# Patient Record
Sex: Male | Born: 1970 | Race: Black or African American | Hispanic: No | Marital: Married | Smoking: Never smoker
Health system: Southern US, Community
[De-identification: ages and names within clinical notes are randomized; demographics above are authoritative.]

## PROBLEM LIST (undated history)

## (undated) DIAGNOSIS — I10 Essential (primary) hypertension: Secondary | ICD-10-CM

## (undated) DIAGNOSIS — I776 Arteritis, unspecified: Secondary | ICD-10-CM

## (undated) DIAGNOSIS — G459 Transient cerebral ischemic attack, unspecified: Secondary | ICD-10-CM

## (undated) DIAGNOSIS — E785 Hyperlipidemia, unspecified: Secondary | ICD-10-CM

## (undated) DIAGNOSIS — I639 Cerebral infarction, unspecified: Secondary | ICD-10-CM

## (undated) DIAGNOSIS — K219 Gastro-esophageal reflux disease without esophagitis: Secondary | ICD-10-CM

## (undated) DIAGNOSIS — I219 Acute myocardial infarction, unspecified: Secondary | ICD-10-CM

## (undated) DIAGNOSIS — N200 Calculus of kidney: Secondary | ICD-10-CM

## (undated) DIAGNOSIS — M549 Dorsalgia, unspecified: Secondary | ICD-10-CM

## (undated) DIAGNOSIS — M5417 Radiculopathy, lumbosacral region: Secondary | ICD-10-CM

## (undated) DIAGNOSIS — G43909 Migraine, unspecified, not intractable, without status migrainosus: Secondary | ICD-10-CM

## (undated) HISTORY — DX: Gastro-esophageal reflux disease without esophagitis: K21.9

## (undated) HISTORY — DX: Radiculopathy, lumbosacral region: M54.17

## (undated) HISTORY — DX: Acute myocardial infarction, unspecified: I21.9

---

## 2012-08-08 HISTORY — PX: BACK SURGERY: SHX140

## 2012-08-08 HISTORY — PX: SPINE SURGERY: SHX786

## 2016-05-18 ENCOUNTER — Emergency Department (HOSPITAL_COMMUNITY)
Admission: EM | Admit: 2016-05-18 | Discharge: 2016-05-18 | Disposition: A | Discharge status: Home / Self Care | Payer: Medicare Other | Attending: Emergency Medicine | Admitting: Emergency Medicine

## 2016-05-18 ENCOUNTER — Encounter (HOSPITAL_COMMUNITY): Payer: Self-pay | Admitting: Emergency Medicine

## 2016-05-18 DIAGNOSIS — R42 Dizziness and giddiness: Secondary | ICD-10-CM

## 2016-05-18 DIAGNOSIS — R001 Bradycardia, unspecified: Secondary | ICD-10-CM | POA: Diagnosis not present

## 2016-05-18 DIAGNOSIS — Z79899 Other long term (current) drug therapy: Secondary | ICD-10-CM | POA: Insufficient documentation

## 2016-05-18 DIAGNOSIS — I10 Essential (primary) hypertension: Secondary | ICD-10-CM | POA: Insufficient documentation

## 2016-05-18 DIAGNOSIS — H9190 Unspecified hearing loss, unspecified ear: Secondary | ICD-10-CM | POA: Diagnosis not present

## 2016-05-18 HISTORY — DX: Dorsalgia, unspecified: M54.9

## 2016-05-18 HISTORY — DX: Essential (primary) hypertension: I10

## 2016-05-18 LAB — URINALYSIS, ROUTINE W REFLEX MICROSCOPIC
BACTERIA UA: NONE SEEN
Bilirubin Urine: NEGATIVE
GLUCOSE, UA: NEGATIVE mg/dL
Ketones, ur: NEGATIVE mg/dL
Leukocytes, UA: NEGATIVE
Nitrite: NEGATIVE
PH: 6 (ref 5.0–8.0)
Protein, ur: NEGATIVE mg/dL
SPECIFIC GRAVITY, URINE: 1.009 (ref 1.005–1.030)

## 2016-05-18 LAB — CBC WITH DIFFERENTIAL/PLATELET
BASOS PCT: 0 %
Basophils Absolute: 0 10*3/uL (ref 0.0–0.1)
Eosinophils Absolute: 0.2 10*3/uL (ref 0.0–0.7)
Eosinophils Relative: 3 %
HEMATOCRIT: 39.2 % (ref 39.0–52.0)
HEMOGLOBIN: 13.3 g/dL (ref 13.0–17.0)
LYMPHS ABS: 2.1 10*3/uL (ref 0.7–4.0)
LYMPHS PCT: 34 %
MCH: 29.5 pg (ref 26.0–34.0)
MCHC: 33.9 g/dL (ref 30.0–36.0)
MCV: 86.9 fL (ref 78.0–100.0)
MONOS PCT: 9 %
Monocytes Absolute: 0.5 10*3/uL (ref 0.1–1.0)
NEUTROS ABS: 3.3 10*3/uL (ref 1.7–7.7)
NEUTROS PCT: 54 %
Platelets: 294 10*3/uL (ref 150–400)
RBC: 4.51 MIL/uL (ref 4.22–5.81)
RDW: 12.6 % (ref 11.5–15.5)
WBC: 6.2 10*3/uL (ref 4.0–10.5)

## 2016-05-18 LAB — BASIC METABOLIC PANEL
Anion gap: 7 (ref 5–15)
BUN: 19 mg/dL (ref 6–20)
CHLORIDE: 106 mmol/L (ref 101–111)
CO2: 26 mmol/L (ref 22–32)
CREATININE: 1.22 mg/dL (ref 0.61–1.24)
Calcium: 9.2 mg/dL (ref 8.9–10.3)
GFR calc non Af Amer: >60 mL/min (ref >60)
GLUCOSE: 113 mg/dL — AB (ref 65–99)
Potassium: 3.4 mmol/L — ABNORMAL LOW (ref 3.5–5.1)
Sodium: 139 mmol/L (ref 135–145)

## 2016-05-18 MED ORDER — FLUTICASONE PROPIONATE 50 MCG/ACT NA SUSP: 2.0000 | Freq: Every day | NASAL | 0 refills | Status: DC

## 2016-05-18 NOTE — ED Notes (Signed)
Patient given some graham crackers and water at his request

## 2016-05-18 NOTE — ED Provider Notes (Signed)
[facility] DEPT Provider Note   CSN: [id number] Arrival date & time: 05/18/16  1014     History   Chief Complaint Chief Complaint  Patient presents with  . Dizziness    HPI Shannon Wilson is a 46 y.o. male.  The history is provided by the patient.  Dizziness  Quality:  Lightheadedness (Pt feels like he going to pass out.  denies vertiginous sx.) Severity:  Moderate Onset quality:  Sudden (woke with yesterday morning) Timing:  Intermittent Progression:  Unchanged Chronicity:  New Context: standing up   Context: not with head movement, not with inactivity and not with loss of consciousness   Relieved by:  Being still Worsened by:  Standing up and sitting upright Ineffective treatments:  None tried Associated symptoms: hearing loss   Associated symptoms: no chest pain, no headaches, no nausea, no palpitations, no shortness of breath, no syncope, no tinnitus, no vision changes, no vomiting and no weakness   Associated symptoms comment:  He reports his ears feel "stopped up".  Denies earache.  He has had no recent uri/sinus, congestion sx.   Risk factors: no new medications   Risk factors comment:  Pt reports increased stress with move from [place] 3 days ago.  Appetite good, probably drinking less fluids over the past few days. States was admitted to the ICU in [place] one month ago for an inflammed main aorta which was treated with abx.   Past Medical History:  Diagnosis Date  . Back pain   . Hypertension     There are no active problems to display for this patient.   Past Surgical History:  Procedure Laterality Date  . BACK SURGERY         Home Medications    Prior to Admission medications   Medication Sig Start Date End Date Taking? Authorizing Provider  amLODipine (NORVASC) 10 MG tablet Take 10 mg by mouth daily.   Yes Historical Provider, MD  gabapentin (NEURONTIN) 300 MG capsule Take 300 mg by mouth 3 (three) times daily.   Yes Historical  Provider, MD  hydrochlorothiazide (HYDRODIURIL) 12.5 MG tablet Take 1 tablet by mouth daily.   Yes Historical Provider, MD  lisinopril (PRINIVIL,ZESTRIL) 40 MG tablet Take 1 tablet by mouth daily.   Yes Historical Provider, MD  fluticasone (FLONASE) 50 MCG/ACT nasal spray Place 2 sprays into both nostrils daily. 05/18/16   Burgess AmorJulie Kevyn Wengert, PA-C    Family History History reviewed. No pertinent family history.  Social History Social History  Substance Use Topics  . Smoking status: Never Smoker  . Smokeless tobacco: Never Used  . Alcohol use No     Allergies   Patient has no known allergies.   Review of Systems Review of Systems  HENT: Positive for hearing loss. Negative for tinnitus.   Respiratory: Negative for shortness of breath.   Cardiovascular: Negative for chest pain, palpitations and syncope.  Gastrointestinal: Negative for nausea and vomiting.  Neurological: Positive for dizziness. Negative for weakness and headaches.     Physical Exam Updated Vital Signs BP 137/95   Pulse 66   Temp 97.8 F (36.6 C) (Oral)   Resp 18   Ht 5\' 11"  (1.803 m)   Wt 86.2 kg   SpO2 99%   BMI 26.50 kg/m   Physical Exam  Constitutional: He appears well-developed and well-nourished.  HENT:  Head: Normocephalic and atraumatic.  Right Ear: Tympanic membrane is bulging. Tympanic membrane is not injected and not erythematous. No middle ear effusion.  Left Ear:  Tympanic membrane is bulging. Tympanic membrane is not injected and not erythematous.  No middle ear effusion.  Nose: No mucosal edema.  Bilateral loss of TM landmarks.  Eyes: Conjunctivae and EOM are normal. Pupils are equal, round, and reactive to light. Right eye exhibits no nystagmus. Left eye exhibits no nystagmus.  Neck: Normal range of motion.  Cardiovascular: Regular rhythm, normal heart sounds and intact distal pulses.  Bradycardia present.   Pulmonary/Chest: Effort normal and breath sounds normal. He has no wheezes.    Abdominal: Soft. Bowel sounds are normal. There is no tenderness.  Musculoskeletal: Normal range of motion.  Neurological: He is alert.  Skin: Skin is warm and dry.  Psychiatric: He has a normal mood and affect.  Nursing note and vitals reviewed.    ED Treatments / Results  Labs (all labs ordered are listed, but only abnormal results are displayed) Labs Reviewed  URINALYSIS, ROUTINE W REFLEX MICROSCOPIC - Abnormal; Notable for the following:       Result Value   Hgb urine dipstick SMALL (*)    All other components within normal limits  BASIC METABOLIC PANEL - Abnormal; Notable for the following:    Potassium 3.4 (*)    Glucose, Bld 113 (*)    All other components within normal limits  CBC WITH DIFFERENTIAL/PLATELET    EKG  EKG Interpretation  Date/Time:  Thursday May 18 2016 10:32:58 EST Ventricular Rate:  53 PR Interval:    QRS Duration: 91 QT Interval:  426 QTC Calculation: 400 R Axis:   33 Text Interpretation:  Sinus rhythm Abnormal R-wave progression, early transition Confirmed by Rubin Payor  MD, NATHAN [phone]) on 05/18/2016 1:02:56 PM       Radiology No results found.  Procedures Procedures (including critical care time)  Medications Ordered in ED Medications - No data to display   Initial Impression / Assessment and Plan / ED Course  I have reviewed the triage vital signs and the nursing notes.  Pertinent labs & imaging results that were available during my care of the patient were reviewed by me and considered in my medical decision making (see chart for details).     Labs reviewed and discussed with pt and stable.  Awaiting outside records.    Records obtained from outside hospital.  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  This past hx appears unrelated to todays sx.   Given ear exam findings suggestive of eustachian tube dysfunction,  Pt placed on flonase nasal steroids.  Referrals given to establish local pcp.  He is ambulatory in dept without antalgic gait or difficulty walking.    The patient appears reasonably screened and/or stabilized for discharge and I doubt any other medical condition or other [facility] requiring further screening, evaluation, or treatment in the ED at this time prior to discharge.   Final Clinical Impressions(s) / ED Diagnoses   Final diagnoses:  Dizziness    New Prescriptions Discharge Medication List as of 05/18/2016  2:13 PM    START taking these medications   Details  fluticasone (FLONASE) 50 MCG/ACT nasal spray Place 2 sprays into both nostrils daily., Starting Thu 05/18/2016, Print         Burgess Amor, PA-C 05/18/16 1435    Benjiman Core, MD 05/18/16 [phone]

## 2016-05-18 NOTE — ED Triage Notes (Signed)
PT states yesterday morning when he woke up he became dizzy when he got out of bed and had dizziness episodes throughout the day with change in positions. PT denies any weakness or numbness to extremities. PT alert and oriented upon arrival to ED today and drove himself today.

## 2016-05-18 NOTE — ED Notes (Signed)
Advised patient we needed urine specimen. 

## 2016-06-13 DIAGNOSIS — M545 Low back pain: Secondary | ICD-10-CM | POA: Diagnosis not present

## 2016-06-19 DIAGNOSIS — M545 Low back pain: Secondary | ICD-10-CM | POA: Diagnosis not present

## 2016-06-22 DIAGNOSIS — M545 Low back pain: Secondary | ICD-10-CM | POA: Diagnosis not present

## 2016-06-26 DIAGNOSIS — M545 Low back pain: Secondary | ICD-10-CM | POA: Diagnosis not present

## 2016-07-29 ENCOUNTER — Encounter (HOSPITAL_COMMUNITY): Payer: Self-pay

## 2016-07-29 ENCOUNTER — Emergency Department (HOSPITAL_COMMUNITY)
Admission: EM | Admit: 2016-07-29 | Discharge: 2016-07-29 | Disposition: A | Discharge status: Home / Self Care | Payer: Medicare Other | Attending: Emergency Medicine | Admitting: Emergency Medicine

## 2016-07-29 DIAGNOSIS — M7989 Other specified soft tissue disorders: Secondary | ICD-10-CM | POA: Diagnosis not present

## 2016-07-29 DIAGNOSIS — H539 Unspecified visual disturbance: Secondary | ICD-10-CM | POA: Diagnosis not present

## 2016-07-29 DIAGNOSIS — M549 Dorsalgia, unspecified: Secondary | ICD-10-CM | POA: Diagnosis not present

## 2016-07-29 DIAGNOSIS — Z79899 Other long term (current) drug therapy: Secondary | ICD-10-CM | POA: Diagnosis not present

## 2016-07-29 DIAGNOSIS — R51 Headache: Secondary | ICD-10-CM | POA: Diagnosis not present

## 2016-07-29 DIAGNOSIS — R531 Weakness: Secondary | ICD-10-CM | POA: Diagnosis not present

## 2016-07-29 DIAGNOSIS — I10 Essential (primary) hypertension: Secondary | ICD-10-CM | POA: Insufficient documentation

## 2016-07-29 LAB — I-STAT CHEM 8, ED
BUN: 17 mg/dL (ref 6–20)
CHLORIDE: 109 mmol/L (ref 101–111)
Calcium, Ion: 1.05 mmol/L — ABNORMAL LOW (ref 1.15–1.40)
Creatinine, Ser: 1.4 mg/dL — ABNORMAL HIGH (ref 0.61–1.24)
Glucose, Bld: 120 mg/dL — ABNORMAL HIGH (ref 65–99)
HCT: 35 % — ABNORMAL LOW (ref 39.0–52.0)
Hemoglobin: 11.9 g/dL — ABNORMAL LOW (ref 13.0–17.0)
POTASSIUM: 3.5 mmol/L (ref 3.5–5.1)
Sodium: 144 mmol/L (ref 135–145)
TCO2: 26 mmol/L (ref 0–100)

## 2016-07-29 MED ORDER — LISINOPRIL 40 MG PO TABS: 40.0000 mg | ORAL_TABLET | Freq: Every day | ORAL | 1 refills | Status: DC

## 2016-07-29 MED ORDER — HYDROCHLOROTHIAZIDE 12.5 MG PO CAPS
12.5000 mg | ORAL_CAPSULE | Freq: Every day | ORAL | Status: DC
  Administered 2016-07-29: 12.5 mg via ORAL
  Filled 2016-07-29 (×3): qty 1

## 2016-07-29 MED ORDER — HYDROCHLOROTHIAZIDE 12.5 MG PO TABS
12.5000 mg | ORAL_TABLET | Freq: Every day | ORAL | 1 refills | Status: DC
Start: 2016-07-29 — End: 2016-08-18

## 2016-07-29 MED ORDER — AMLODIPINE BESYLATE 10 MG PO TABS: 10.0000 mg | ORAL_TABLET | Freq: Every day | ORAL | 1 refills | Status: DC

## 2016-07-29 NOTE — ED Notes (Addendum)
Pt states 4 days without BP Rx, unable to see new PCP until May. States HA, blurred vision, and generalized weakness since being without Rx. Feels similar to before he was prescribed BP Rx.

## 2016-07-29 NOTE — ED Notes (Signed)
Pt states understanding of care given and follow up instructions.  Pt a/o and ambulated with steady gait from ED

## 2016-07-29 NOTE — ED Triage Notes (Signed)
Patient states that he has been out of his blood pressure medications for 4 days.  Needs prescriptions for them until he can see new MD in May 11th.  Recently moved here from [place].

## 2016-07-29 NOTE — ED Notes (Signed)
AC to bring medication 

## 2016-07-29 NOTE — ED Provider Notes (Signed)
[facility] DEPT Provider Note   CSN: [id number] Arrival date & time: 07/29/16  2051  By signing my name below, I, Jaquelyn Bitter., attest that this documentation has been prepared under the direction and in the presence of Fredia Sorrow, MD. Electronically signed: Jaquelyn Bitter., ED Scribe. 07/29/16. 11:01 PM.   History   Chief Complaint Chief Complaint  Patient presents with  . Hypertension    HPI Shannon Wilson is a 46 y.o. male with hx of HTN who presents to the Emergency Department complaining of hypertension with onset x4 days. Pt states that he has been out of his blood pressure for the past x4 days and is here from [place]. Pt states that he normally takes all of his blood pressure medication in the mornings. He reports a waxing and waning headache and weakness for the past x3 days. He rates the headache currently 7/10. He denies any modifying factors. Pt denies fever, cold symptoms, chest pain, nausea and vomiting. Of note, pt reportedly has headaches and weakness with HTN.  The history is provided by the patient. No language interpreter was used.    Past Medical History:  Diagnosis Date  . Back pain   . Hypertension     There are no active problems to display for this patient.   Past Surgical History:  Procedure Laterality Date  . BACK SURGERY         Home Medications    Prior to Admission medications   Medication Sig Start Date End Date Taking? Authorizing Provider  amLODipine (NORVASC) 10 MG tablet Take 10 mg by mouth daily.   Yes Historical Provider, MD  gabapentin (NEURONTIN) 300 MG capsule Take 300 mg by mouth 3 (three) times daily.   Yes Historical Provider, MD  hydrochlorothiazide (HYDRODIURIL) 12.5 MG tablet Take 1 tablet by mouth daily.   Yes Historical Provider, MD  lisinopril (PRINIVIL,ZESTRIL) 40 MG tablet Take 1 tablet by mouth daily.   Yes Historical Provider, MD  amLODipine (NORVASC) 10 MG tablet Take 1 tablet (10 mg  total) by mouth daily. 07/29/16   Fredia Sorrow, MD  fluticasone (FLONASE) 50 MCG/ACT nasal spray Place 2 sprays into both nostrils daily. Patient not taking: Reported on 07/29/2016 05/18/16   Evalee Jefferson, PA-C  hydrochlorothiazide (HYDRODIURIL) 12.5 MG tablet Take 1 tablet (12.5 mg total) by mouth daily. 07/29/16   Fredia Sorrow, MD  lisinopril (PRINIVIL,ZESTRIL) 40 MG tablet Take 1 tablet (40 mg total) by mouth daily. 07/29/16   Fredia Sorrow, MD    Family History No family history on file.  Social History Social History  Substance Use Topics  . Smoking status: Never Smoker  . Smokeless tobacco: Never Used  . Alcohol use No     Allergies   Patient has no known allergies.   Review of Systems Review of Systems  Constitutional: Negative for chills and fever.  HENT: Negative for rhinorrhea and sore throat.   Eyes: Positive for visual disturbance.  Respiratory: Negative for cough and shortness of breath.   Cardiovascular: Positive for leg swelling. Negative for chest pain.  Gastrointestinal: Negative for nausea and vomiting.  Genitourinary: Negative for dysuria and hematuria.  Musculoskeletal: Positive for back pain.  Skin: Negative for rash.  Neurological: Positive for weakness and headaches.  Hematological: Does not bruise/bleed easily.     Physical Exam Updated Vital Signs BP (!) 157/100   Pulse 64   Temp 98.4 F (36.9 C) (Oral)   Resp 16   Ht 5' 11"  (1.803  m)   Wt 88 kg   SpO2 98%   BMI 27.06 kg/m   Physical Exam  Constitutional: He appears well-developed and well-nourished.  HENT:  Head: Normocephalic and atraumatic.  Eyes: Conjunctivae and EOM are normal. Pupils are equal, round, and reactive to light.  Pupils normal, sclera clear, eyes are tracking normally  Neck: Neck supple.  Cardiovascular: Normal rate and regular rhythm.   No murmur heard. Pulmonary/Chest: Effort normal and breath sounds normal. No respiratory distress.  Lungs clear bilaterally.     Abdominal: Soft. Bowel sounds are normal. There is no tenderness.  Musculoskeletal: He exhibits no edema.       Right lower leg: He exhibits no edema.       Left lower leg: He exhibits no edema.  Neurological: He is alert. No cranial nerve deficit or sensory deficit. He exhibits normal muscle tone. Coordination normal.  Skin: Skin is warm and dry.  Psychiatric: He has a normal mood and affect.  Nursing note and vitals reviewed.    ED Treatments / Results   DIAGNOSTIC STUDIES: Oxygen Saturation is 96% on RA, adequate by my interpretation.   COORDINATION OF CARE: 11:01 PM-Discussed next steps with pt. Pt verbalized understanding and is agreeable with the plan.    Labs (all labs ordered are listed, but only abnormal results are displayed) Labs Reviewed  I-STAT CHEM 8, ED - Abnormal; Notable for the following:       Result Value   Creatinine, Ser 1.40 (*)    Glucose, Bld 120 (*)    Calcium, Ion 1.05 (*)    Hemoglobin 11.9 (*)    HCT 35.0 (*)    All other components within normal limits    EKG  EKG Interpretation None       Radiology No results found.  Procedures Procedures (including critical care time)  Medications Ordered in ED Medications  hydrochlorothiazide (MICROZIDE) capsule 12.5 mg (not administered)     Initial Impression / Assessment and Plan / ED Course  I have reviewed the triage vital signs and the nursing notes.  Pertinent labs & imaging results that were available during my care of the patient were reviewed by me and considered in my medical decision making (see chart for details).   patient with known history of hypertension. Tabs hypertensive meds for 4 days. Patient with the intermittent headache mild currently but sometimes worse. No strokelike symptoms no trouble breathing no chest pain. Patient states with blood pressures up he usually does get a mild headache. Nothing unusual about that.  Basic electrolytes here without any sniffing  abnormality. We'll renew his medications. It one dose of medication here tonight. Patient recently moved here from [place]. He will need to find a primary care doctor.    Final Clinical Impressions(s) / ED Diagnoses   Final diagnoses:  Essential hypertension    New Prescriptions New Prescriptions   AMLODIPINE (NORVASC) 10 MG TABLET    Take 1 tablet (10 mg total) by mouth daily.   HYDROCHLOROTHIAZIDE (HYDRODIURIL) 12.5 MG TABLET    Take 1 tablet (12.5 mg total) by mouth daily.   LISINOPRIL (PRINIVIL,ZESTRIL) 40 MG TABLET    Take 1 tablet (40 mg total) by mouth daily.   I personally performed the services described in this documentation, which was scribed in my presence. The recorded information has been reviewed and is accurate.       Fredia Sorrow, MD 07/29/16 [phone]

## 2016-07-29 NOTE — Discharge Instructions (Signed)
Important that you find a primary care doctor. Take your hypertensive meds as directed starting tomorrow. Return for any new or worse symptoms to include severe headache chest pain trouble breathing or any strokelike symptoms.

## 2016-08-18 ENCOUNTER — Encounter: Payer: Self-pay | Admitting: Family Medicine

## 2016-08-18 ENCOUNTER — Ambulatory Visit (INDEPENDENT_AMBULATORY_CARE_PROVIDER_SITE_OTHER): Payer: Medicare Other | Admitting: Family Medicine

## 2016-08-18 VITALS — BP 130/86 | HR 68 | Temp 98.2°F | Resp 16 | Ht 71.0 in | Wt 187.1 lb

## 2016-08-18 DIAGNOSIS — M545 Low back pain, unspecified: Secondary | ICD-10-CM | POA: Insufficient documentation

## 2016-08-18 DIAGNOSIS — I252 Old myocardial infarction: Secondary | ICD-10-CM

## 2016-08-18 DIAGNOSIS — E559 Vitamin D deficiency, unspecified: Secondary | ICD-10-CM | POA: Diagnosis not present

## 2016-08-18 DIAGNOSIS — E739 Lactose intolerance, unspecified: Secondary | ICD-10-CM | POA: Diagnosis not present

## 2016-08-18 DIAGNOSIS — D649 Anemia, unspecified: Secondary | ICD-10-CM | POA: Diagnosis not present

## 2016-08-18 DIAGNOSIS — G8929 Other chronic pain: Secondary | ICD-10-CM | POA: Diagnosis not present

## 2016-08-18 DIAGNOSIS — I776 Arteritis, unspecified: Secondary | ICD-10-CM | POA: Insufficient documentation

## 2016-08-18 DIAGNOSIS — R7309 Other abnormal glucose: Secondary | ICD-10-CM | POA: Diagnosis not present

## 2016-08-18 DIAGNOSIS — I10 Essential (primary) hypertension: Secondary | ICD-10-CM | POA: Diagnosis not present

## 2016-08-18 DIAGNOSIS — I2510 Atherosclerotic heart disease of native coronary artery without angina pectoris: Secondary | ICD-10-CM | POA: Insufficient documentation

## 2016-08-18 LAB — CBC WITH DIFFERENTIAL/PLATELET
BASOS PCT: 1 %
Basophils Absolute: 68 cells/uL (ref 0–200)
EOS ABS: 272 {cells}/uL (ref 15–500)
Eosinophils Relative: 4 %
HEMATOCRIT: 42 % (ref 38.5–50.0)
HEMOGLOBIN: 14 g/dL (ref 13.2–17.1)
LYMPHS ABS: 2108 {cells}/uL (ref 850–3900)
LYMPHS PCT: 31 %
MCH: 29.5 pg (ref 27.0–33.0)
MCHC: 33.3 g/dL (ref 32.0–36.0)
MCV: 88.6 fL (ref 80.0–100.0)
MONO ABS: 544 {cells}/uL (ref 200–950)
MPV: 9.4 fL (ref 7.5–12.5)
Monocytes Relative: 8 %
NEUTROS ABS: 3808 {cells}/uL (ref 1500–7800)
Neutrophils Relative %: 56 %
Platelets: 313 10*3/uL (ref 140–400)
RBC: 4.74 MIL/uL (ref 4.20–5.80)
RDW: 13.2 % (ref 11.0–15.0)
WBC: 6.8 10*3/uL (ref 3.8–10.8)

## 2016-08-18 MED ORDER — HYDROCHLOROTHIAZIDE 12.5 MG PO TABS: 12.5000 mg | ORAL_TABLET | Freq: Every day | ORAL | 1 refills | Status: DC

## 2016-08-18 MED ORDER — AMLODIPINE BESYLATE 10 MG PO TABS: 10.0000 mg | ORAL_TABLET | Freq: Every day | ORAL | 1 refills | Status: DC

## 2016-08-18 MED ORDER — LISINOPRIL 40 MG PO TABS: 40.0000 mg | ORAL_TABLET | Freq: Every day | ORAL | 1 refills | Status: DC

## 2016-08-18 NOTE — Patient Instructions (Signed)
Continue to eat well Drink plenty of water Walk every day that you are able Go for lab tests today Need old records PCP in [place]  See me in [place]

## 2016-08-18 NOTE — Progress Notes (Signed)
Chief Complaint  Patient presents with  . Establish Care   Very nice man who was raised in [place] and moved to [place]  In December 2017. Well controlled hypertension History of "small heart attack " X2 Disabled from working due to "nerve damage" left leg after back surgery.  Takes gabapentin prn Yearly flu shots Had a tetanus within 8210 y  Had a hospital stay for celiac artery vasculitis/inflammation in 12/2015.  Went to ER for abdominal pain.  Labs reasonably normal at first but CT was abnormal.  Sed rate mildly elevated at 26.  CRP high at 7.3 , negative ANA, Hep B and HepC.  He did have glucose levels randomly over 200 on 2 occasions. It resolved with oral steroids and never came back.  The rheumatologist never identified a reason for this.   PCP records requested. He has never been on a statin.  He went to the ER in April for BP and had some repeat lab tests.  His creatinine was up to 1.4.  His H/H had dropped to 11.9/35 from a prior 14.2/40.6 in September.  He has had NO bleeding or change in health status.      Patient Active Problem List   Diagnosis Date Noted  . Essential hypertension 08/18/2016  . History of MI (myocardial infarction) 08/18/2016  . Coronary artery disease involving native heart without angina pectoris 08/18/2016  . Chronic low back pain 08/18/2016  . 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 Elevated random blood glucose level 08/18/2016  . Lactose intolerance in adult 08/18/2016    Outpatient Encounter Prescriptions as of 08/18/2016  Medication Sig  . amLODipine (NORVASC) 10 MG tablet Take 1 tablet (10 mg total) by mouth daily.  . fluticasone (FLONASE) 50 MCG/ACT nasal spray Place 2 sprays into both nostrils daily.  Marland Kitchen. gabapentin (NEURONTIN) 300 MG capsule Take 300 mg by mouth as needed.   . hydrochlorothiazide (HYDRODIURIL) 12.5 MG tablet Take 1 tablet (12.5 mg total) by mouth daily.  Marland Kitchen. lisinopril (PRINIVIL,ZESTRIL) 40 MG tablet Take 1  tablet (40 mg total) by mouth daily.   No facility-administered encounter medications on file as of 08/18/2016.     Past Medical History:  Diagnosis Date  . Back pain   . GERD (gastroesophageal reflux disease)   . Hypertension   . Myocardial infarction [facility](HCC)     Past Surgical History:  Procedure Laterality Date  . BACK SURGERY  08/2012  . SPINE SURGERY  08/2012   discectomy    Social History   Social History  . Marital status: Married    Spouse name: N/A  . Number of children: 4  . Years of education: 12   Occupational History  . disabled     heating and air   Social History Main Topics  . Smoking status: Never Smoker  . Smokeless tobacco: Never Used  . Alcohol use No  . Drug use: No  . Sexual activity: Not Currently    Birth control/ protection: None   Other Topics Concern  . Not on file   Social History Narrative   Living with mother   [place] near [place]    Family History  Problem Relation Age of Onset  . Hypertension Mother   . Heart disease Mother [id number]  . Cancer Father        prostate  . Sickle cell trait Father   . Sickle cell trait Son   . Cancer Maternal Grandmother  bone  . Cancer Paternal Grandmother   . Cancer Paternal Grandfather        liver  . Learning disabilities Neg Hx     Review of Systems  Constitutional: Negative for chills, fever and weight loss.  HENT: Negative for congestion and hearing loss.   Eyes: Negative for blurred vision and pain.  Respiratory: Negative for cough and shortness of breath.   Cardiovascular: Negative for chest pain and leg swelling.  Gastrointestinal: Negative for abdominal pain, blood in stool, constipation, diarrhea and heartburn.  Genitourinary: Negative for dysuria, frequency and hematuria.  Musculoskeletal: Positive for back pain. Negative for falls, joint pain and myalgias.  Neurological: Positive for sensory change and focal weakness. Negative for dizziness, seizures and  headaches.  Endo/Heme/Allergies: Negative for polydipsia. Does not bruise/bleed easily.  Psychiatric/Behavioral: Positive for depression. The patient is not nervous/anxious and does not have insomnia.     BP 130/86 (BP Location: Right Arm, Patient Position: Sitting, Cuff Size: Normal)   Pulse 68   Temp 98.2 F (36.8 C) (Temporal)   Resp 16   Ht 5\' 11"  (1.803 m)   Wt 187 lb 1.1 oz (84.9 kg)   SpO2 98%   BMI 26.09 kg/m   Physical Exam  Constitutional: He is oriented to person, place, and time. He appears well-developed and well-nourished.  HENT:  Head: Normocephalic and atraumatic.  Right Ear: External ear normal.  Left Ear: External ear normal.  Mouth/Throat: Oropharynx is clear and moist.  Dental fractures  Eyes: Conjunctivae are normal. Pupils are equal, round, and reactive to light.  Neck: Normal range of motion. Neck supple. No thyromegaly present.  Cardiovascular: Normal rate, regular rhythm and normal heart sounds.   Pulmonary/Chest: Effort normal and breath sounds normal. No respiratory distress.  Abdominal: Soft. Bowel sounds are normal. There is no tenderness.  Musculoskeletal: Normal range of motion. He exhibits no edema.  Well healed back scar  Lymphadenopathy:    He has no cervical adenopathy.  Neurological: He is alert and oriented to person, place, and time.  Gait antalgic.  Weakness L leg  Skin: Skin is warm and dry.  Psychiatric: He has a normal mood and affect. His behavior is normal. Thought content normal.  Nursing note and vitals reviewed.  ASSESSMENT/PLAN:  1. Essential hypertension   2. Coronary artery disease involving native heart without angina pectoris, unspecified vessel or lesion type - COMPLETE METABOLIC PANEL WITH GFR - Lipid panel - VITAMIN D 25 Hydroxy (Vit-D Deficiency, Fractures) - Urinalysis, Routine w reflex microscopic - Hemoglobin A1c - CBC with Differential/Platelet - IBC panel  3. History of MI (myocardial infarction)  4.  Chronic low back pain, unspecified back pain laterality, with sciatica presence unspecified  5. Celiac artery vasculitis (HCC)  6. Elevated random blood glucose level  7. Lactose intolerance in adult  8. Vitamin D deficiency - VITAMIN D 25 Hydroxy (Vit-D Deficiency, Fractures)  9. Anemia, normocytic normochromic - IBC panel   Patient Instructions  Continue to eat well Drink plenty of water Walk every day that you are able Go for lab tests today Need old records PCP in [place]  See me in [facility], [place]

## 2016-08-19 LAB — COMPLETE METABOLIC PANEL WITH GFR
ALBUMIN: 4.2 g/dL (ref 3.6–5.1)
ALK PHOS: 76 U/L (ref 40–115)
ALT: 18 U/L (ref 9–46)
AST: 16 U/L (ref 10–40)
BUN: 14 mg/dL (ref 7–25)
CO2: 26 mmol/L (ref 20–31)
Calcium: 9.3 mg/dL (ref 8.6–10.3)
Chloride: 109 mmol/L (ref 98–110)
Creat: 1.18 mg/dL (ref 0.60–1.35)
GFR, EST AFRICAN AMERICAN: 87 mL/min (ref >=60)
GFR, EST NON AFRICAN AMERICAN: 75 mL/min (ref >=60)
GLUCOSE: 115 mg/dL — AB (ref 65–99)
POTASSIUM: 3.7 mmol/L (ref 3.5–5.3)
SODIUM: 144 mmol/L (ref 135–146)
Total Bilirubin: 0.5 mg/dL (ref 0.2–1.2)
Total Protein: 6.9 g/dL (ref 6.1–8.1)

## 2016-08-19 LAB — URINALYSIS, ROUTINE W REFLEX MICROSCOPIC
BILIRUBIN URINE: NEGATIVE
Glucose, UA: NEGATIVE
Hgb urine dipstick: NEGATIVE
Ketones, ur: NEGATIVE
Leukocytes, UA: NEGATIVE
Nitrite: NEGATIVE
PH: 6 (ref 5.0–8.0)
PROTEIN: NEGATIVE
Specific Gravity, Urine: 1.013 (ref 1.001–1.035)

## 2016-08-19 LAB — HEMOGLOBIN A1C
Hgb A1c MFr Bld: 5.6 % (ref <5.7)
Mean Plasma Glucose: 114 mg/dL

## 2016-08-19 LAB — LIPID PANEL
CHOL/HDL RATIO: 4.8 ratio (ref <5.0)
Cholesterol: 144 mg/dL (ref <200)
HDL: 30 mg/dL — ABNORMAL LOW (ref >40)
LDL Cholesterol: 64 mg/dL (ref <100)
TRIGLYCERIDES: 249 mg/dL — AB (ref <150)
VLDL: 50 mg/dL — ABNORMAL HIGH (ref <30)

## 2016-08-19 LAB — VITAMIN D 25 HYDROXY (VIT D DEFICIENCY, FRACTURES): VIT D 25 HYDROXY: 18 ng/mL — AB (ref 30–100)

## 2016-08-19 LAB — IRON AND TIBC
%SAT: 27 % (ref 15–60)
Iron: 84 ug/dL (ref 50–180)
TIBC: 312 ug/dL (ref 250–425)
UIBC: 228 ug/dL (ref 125–400)

## 2016-08-21 ENCOUNTER — Encounter: Payer: Self-pay | Admitting: Family Medicine

## 2016-08-21 MED ORDER — ROSUVASTATIN CALCIUM 20 MG PO TABS: 20.0000 mg | ORAL_TABLET | Freq: Every day | ORAL | 3 refills | Status: DC

## 2016-09-25 ENCOUNTER — Ambulatory Visit: Payer: Medicare Other | Admitting: Family Medicine

## 2016-10-02 ENCOUNTER — Encounter: Payer: Self-pay | Admitting: Family Medicine

## 2016-10-02 ENCOUNTER — Ambulatory Visit (INDEPENDENT_AMBULATORY_CARE_PROVIDER_SITE_OTHER): Payer: Medicare Other | Admitting: Family Medicine

## 2016-10-02 VITALS — BP 124/86 | HR 64 | Temp 98.0°F | Resp 16 | Ht 71.0 in | Wt 189.1 lb

## 2016-10-02 DIAGNOSIS — G8929 Other chronic pain: Secondary | ICD-10-CM

## 2016-10-02 DIAGNOSIS — M545 Low back pain: Secondary | ICD-10-CM | POA: Diagnosis not present

## 2016-10-02 DIAGNOSIS — E559 Vitamin D deficiency, unspecified: Secondary | ICD-10-CM | POA: Insufficient documentation

## 2016-10-02 DIAGNOSIS — I10 Essential (primary) hypertension: Secondary | ICD-10-CM

## 2016-10-02 DIAGNOSIS — I2510 Atherosclerotic heart disease of native coronary artery without angina pectoris: Secondary | ICD-10-CM

## 2016-10-02 NOTE — Patient Instructions (Addendum)
I recommend a back x ray Continue as active as you can manage  Need labs repeated in 6 months See me in six months Come sooner for problems

## 2016-10-02 NOTE — Progress Notes (Signed)
Chief Complaint  Patient presents with  . Follow-up    1 month   Blood pressure is well controlled. Labs from last visit are reviewed. Patient has vitamin D deficiency. Metabolic panel and CBC are normal. Vitamin D deficiency identified. Vitamin D replacement discussed. Patient has a low-cholesterol with a LDL of 65. He has a history of coronary artery disease. He will be referred to cardiology for routine visit and follow-up. He wants to discuss his low back pain. He states that his back hurts him on a daily basis. He states that his hurt every day for the last 4 years since he had his back surgery. In addition he continues to have "nerve pain" down his left leg. He questions whether he can go back to work. He worked in [employer] for 20 years. I believe that any position that would require excessive bending and lifting would be hard on his back and potentially worsen his condition. I support that he would try to find other employment or consider additional education. Because of his ongoing back pain included a back x-ray and consider additional imaging. He is compliant with medicines. He is compliant with diet. He is compliant with exercise.  Patient Active Problem List   Diagnosis Date Noted  . Vitamin D deficiency 10/02/2016  . Essential hypertension 08/18/2016  . History of MI (myocardial infarction) 08/18/2016  . Coronary artery disease involving native heart without angina pectoris 08/18/2016  . Chronic low back pain 08/18/2016  . 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 Elevated random blood glucose level 08/18/2016  . Lactose intolerance in adult 08/18/2016    Outpatient Encounter Prescriptions as of 10/02/2016  Medication Sig  . amLODipine (NORVASC) 10 MG tablet Take 1 tablet (10 mg total) by mouth daily.  Marland Kitchen gabapentin (NEURONTIN) 300 MG capsule Take 300 mg by mouth as needed.   . hydrochlorothiazide (HYDRODIURIL) 12.5 MG tablet Take 1 tablet (12.5 mg total) by  mouth daily.  Marland Kitchen lisinopril (PRINIVIL,ZESTRIL) 40 MG tablet Take 1 tablet (40 mg total) by mouth daily.  . rosuvastatin (CRESTOR) 20 MG tablet Take 1 tablet (20 mg total) by mouth daily.  . [DISCONTINUED] fluticasone (FLONASE) 50 MCG/ACT nasal spray Place 2 sprays into both nostrils daily. (Patient not taking: Reported on 10/02/2016)   No facility-administered encounter medications on file as of 10/02/2016.     No Known Allergies  Review of Systems  Constitutional: Negative for fatigue and unexpected weight change.  HENT: Negative for congestion and dental problem.   Eyes: Negative for photophobia and visual disturbance.  Respiratory: Negative for shortness of breath and wheezing.   Cardiovascular: Negative for chest pain, palpitations and leg swelling.  Gastrointestinal: Negative for blood in stool, constipation and diarrhea.  Genitourinary: Negative for difficulty urinating and frequency.  Musculoskeletal: Positive for back pain and gait problem.  Neurological: Positive for numbness. Negative for dizziness and headaches.  Psychiatric/Behavioral: Negative for dysphoric mood. The patient is not nervous/anxious.     BP 124/86 (BP Location: Right Arm, Patient Position: Sitting, Cuff Size: Normal)   Pulse 64   Temp 98 F (36.7 C) (Temporal)   Resp 16   Ht 5\' 11"  (1.803 m)   Wt 189 lb 1.9 oz (85.8 kg)   SpO2 99%   BMI 26.38 kg/m   Physical Exam  Constitutional: He is oriented to person, place, and time. He appears well-developed and well-nourished.  HENT:  Head: Normocephalic and atraumatic.  Mouth/Throat: Oropharynx is clear and moist.  Eyes: Conjunctivae are normal. Pupils are equal, round, and reactive to light.  Neck: Normal range of motion. Neck supple. No thyromegaly present.  Cardiovascular: Normal rate, regular rhythm and normal heart sounds.   Pulmonary/Chest: Effort normal and breath sounds normal. No respiratory distress.  Abdominal: Soft. Bowel sounds are normal.    Musculoskeletal: Normal range of motion. He exhibits no edema.  Lymphadenopathy:    He has no cervical adenopathy.  Neurological: He is alert and oriented to person, place, and time.  Mildly antalgic gait  Skin: Skin is warm and dry.  Psychiatric: He has a normal mood and affect. His behavior is normal. Thought content normal.  Nursing note and vitals reviewed.   ASSESSMENT/PLAN:  1. Essential hypertension Controlled - Lipid panel - COMPLETE METABOLIC PANEL WITH GFR - CBC - Urinalysis, Routine w reflex microscopic  2. Vitamin D deficiency  - VITAMIN D 25 Hydroxy (Vit-D Deficiency, Fractures)  3. Chronic low back pain, unspecified back pain laterality, with sciatica presence unspecified Discussed activity, exercises, chronic low back pain management. - DG Lumbar Spine Complete; Future  4. Coronary artery disease involving native heart without angina pectoris, unspecified vessel or lesion type Asymptomatic. - Ambulatory referral to Cardiology   Patient Instructions  I recommend a back x ray Continue as active as you can manage  Need labs repeated in 6 months See me in six months Come sooner for problems   Eustace MooreYvonne Sue Deklen Popelka, MD
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
## 2016-11-14 ENCOUNTER — Other Ambulatory Visit (HOSPITAL_COMMUNITY)
Admission: RE | Admit: 2016-11-14 | Discharge: 2016-11-14 | Disposition: A | Discharge status: Home / Self Care | Payer: Medicare Other | Source: Ambulatory Visit | Attending: Cardiovascular Disease | Admitting: Cardiovascular Disease

## 2016-11-14 ENCOUNTER — Ambulatory Visit (INDEPENDENT_AMBULATORY_CARE_PROVIDER_SITE_OTHER): Payer: Medicare Other | Admitting: Cardiovascular Disease

## 2016-11-14 ENCOUNTER — Telehealth: Payer: Self-pay | Admitting: *Deleted

## 2016-11-14 ENCOUNTER — Encounter: Payer: Self-pay | Admitting: Cardiovascular Disease

## 2016-11-14 VITALS — Ht 71.0 in | Wt 188.2 lb

## 2016-11-14 DIAGNOSIS — I10 Essential (primary) hypertension: Secondary | ICD-10-CM | POA: Diagnosis not present

## 2016-11-14 DIAGNOSIS — L930 Discoid lupus erythematosus: Secondary | ICD-10-CM | POA: Diagnosis not present

## 2016-11-14 LAB — SEDIMENTATION RATE: Sed Rate: 12 mm/hr (ref 0–16)

## 2016-11-14 NOTE — Telephone Encounter (Signed)
-----  Message from Josue Hector, MD sent at 11/14/2016  3:53 PM EDT ----- ESR normal great no signs of inflammation or need for more steroids

## 2016-11-14 NOTE — Patient Instructions (Signed)
Medication Instructions:   Your physician recommends that you continue on your current medications as directed. Please refer to the Current Medication list given to you today.  Labwork:  Your physician recommends that you have lab work today to check your Sed Rate ESR.  Testing/Procedures:  NONE  Follow-Up:  Your physician recommends that you schedule a follow-up appointment in: pending.  Any Other Special Instructions Will Be Listed Below (If Applicable).  If you need a refill on your cardiac medications before your next appointment, please call your pharmacy.

## 2016-11-14 NOTE — Telephone Encounter (Signed)
Called patient with test results. No answer. Left message to call back.  

## 2016-12-05 ENCOUNTER — Telehealth: Payer: Self-pay | Admitting: *Deleted

## 2016-12-05 NOTE — Telephone Encounter (Signed)
Patient walked in requesting  Lisinopril 40mg , hydrochlorot 12.5mg , and amlodipine 10mg  to be refilled to [facility] in [place]. Please advise

## 2016-12-05 NOTE — Telephone Encounter (Signed)
Patient is aware 

## 2016-12-05 NOTE — Telephone Encounter (Signed)
Pt does not need new rx, has refills at [facility].Marland Kitcheni called to [facility],,please let him know.. They are getting them ready for him.. Thank you.

## 2016-12-24 ENCOUNTER — Emergency Department (HOSPITAL_COMMUNITY): Payer: Medicare Other

## 2016-12-24 ENCOUNTER — Encounter (HOSPITAL_COMMUNITY): Payer: Self-pay | Admitting: *Deleted

## 2016-12-24 ENCOUNTER — Observation Stay (HOSPITAL_COMMUNITY)
Admission: EM | Admit: 2016-12-24 | Discharge: 2016-12-25 | Disposition: A | Discharge status: Home / Self Care | Payer: Medicare Other | Attending: Internal Medicine | Admitting: Internal Medicine

## 2016-12-24 DIAGNOSIS — I10 Essential (primary) hypertension: Secondary | ICD-10-CM | POA: Diagnosis not present

## 2016-12-24 DIAGNOSIS — I2510 Atherosclerotic heart disease of native coronary artery without angina pectoris: Secondary | ICD-10-CM | POA: Diagnosis not present

## 2016-12-24 DIAGNOSIS — I252 Old myocardial infarction: Secondary | ICD-10-CM | POA: Insufficient documentation

## 2016-12-24 DIAGNOSIS — I639 Cerebral infarction, unspecified: Secondary | ICD-10-CM | POA: Diagnosis not present

## 2016-12-24 DIAGNOSIS — G459 Transient cerebral ischemic attack, unspecified: Principal | ICD-10-CM | POA: Insufficient documentation

## 2016-12-24 DIAGNOSIS — I6389 Other cerebral infarction: Secondary | ICD-10-CM

## 2016-12-24 DIAGNOSIS — R4781 Slurred speech: Secondary | ICD-10-CM | POA: Diagnosis not present

## 2016-12-24 DIAGNOSIS — Z79899 Other long term (current) drug therapy: Secondary | ICD-10-CM | POA: Insufficient documentation

## 2016-12-24 DIAGNOSIS — R202 Paresthesia of skin: Secondary | ICD-10-CM | POA: Diagnosis not present

## 2016-12-24 DIAGNOSIS — R200 Anesthesia of skin: Secondary | ICD-10-CM | POA: Diagnosis present

## 2016-12-24 LAB — I-STAT TROPONIN, ED: Troponin i, poc: 0 ng/mL (ref 0.00–0.08)

## 2016-12-24 LAB — I-STAT CHEM 8, ED
BUN: 24 mg/dL — AB (ref 6–20)
CALCIUM ION: 1.1 mmol/L — AB (ref 1.15–1.40)
CREATININE: 1.2 mg/dL (ref 0.61–1.24)
Chloride: 106 mmol/L (ref 101–111)
GLUCOSE: 115 mg/dL — AB (ref 65–99)
HCT: 40 % (ref 39.0–52.0)
Hemoglobin: 13.6 g/dL (ref 13.0–17.0)
Potassium: 3.7 mmol/L (ref 3.5–5.1)
Sodium: 144 mmol/L (ref 135–145)
TCO2: 30 mmol/L (ref 22–32)

## 2016-12-24 NOTE — ED Notes (Signed)
Patient transported to CT 

## 2016-12-24 NOTE — ED Notes (Signed)
Pt has signed for permission to release medical records from [facility] in [place], [place].

## 2016-12-24 NOTE — ED Triage Notes (Signed)
Pt c/o left facial tingling and left arm tingling that started today  around 9am,

## 2016-12-24 NOTE — ED Provider Notes (Signed)
[facility] DEPT Provider Note   CSN: [id number] Arrival date & time: 12/24/16  2244     History   Chief Complaint Chief Complaint  Patient presents with  . Tingling    HPI Shannon Wilson is a 46 y.o. male.  HPI  This is a 46 year old male with a history of hypertension, coronary artery disease who presents with tingling and numbness. Patient reports that he woke up this morning at 9 AM with tingling in the left hand and left face. This resolved after 30 minutes. However, one of her prior to arrival he had recurrence of symptoms. Currently he is asymptomatic. He does report "tingling just below his eye. Denies any weakness, numbness, tingling, vision changes elsewhere. He has been ambulatory. He reports one prior episode of this one year ago.Per patient report, he was worked up at a [facility] and told he may have had a small stroke. He took Xarelto for one month. He is not currently on any anticoagulants. Denies any recent illnesses or fevers. Denies any chest pain, shortness breath, nausea, vomiting.  Past Medical History:  Diagnosis Date  . Back pain   . GERD (gastroesophageal reflux disease)   . Hypertension   . Myocardial infarction [facility])     Patient Active Problem List   Diagnosis Date Noted  . Vitamin D deficiency 10/02/2016  . Essential hypertension 08/18/2016  . History of MI (myocardial infarction) 08/18/2016  . Coronary artery disease involving native heart without angina pectoris 08/18/2016  . Chronic low back pain 08/18/2016  . 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 Elevated random blood glucose level 08/18/2016  . Lactose intolerance in adult 08/18/2016    Past Surgical History:  Procedure Laterality Date  . BACK SURGERY  08/2012  . SPINE SURGERY  08/2012   discectomy       Home Medications    Prior to Admission medications   Medication Sig Start Date End Date Taking? Authorizing Provider  amLODipine (NORVASC) 10 MG tablet Take 1  tablet (10 mg total) by mouth daily. 08/18/16   Raylene Everts, MD  gabapentin (NEURONTIN) 300 MG capsule Take 300 mg by mouth as needed.     [provider]  hydrochlorothiazide (HYDRODIURIL) 12.5 MG tablet Take 1 tablet (12.5 mg total) by mouth daily. 08/18/16   Raylene Everts, MD  lisinopril (PRINIVIL,ZESTRIL) 40 MG tablet Take 1 tablet (40 mg total) by mouth daily. 08/18/16   Raylene Everts, MD  rosuvastatin (CRESTOR) 20 MG tablet Take 1 tablet (20 mg total) by mouth daily. 08/21/16   Raylene Everts, MD    Family History Family History  Problem Relation Age of Onset  . Hypertension Mother   . Heart disease Mother 40  . Cancer Father        prostate  . Sickle cell trait Father   . Sickle cell trait Son   . Cancer Maternal Grandmother        bone  . Cancer Paternal Grandmother   . Cancer Paternal Grandfather        liver  . Learning disabilities Neg Hx     Social History Social History  Substance Use Topics  . Smoking status: Never Smoker  . Smokeless tobacco: Never Used  . Alcohol use No     Allergies   Patient has no known allergies.   Review of Systems Review of Systems  Constitutional: Negative for fever.  Eyes: Negative for visual disturbance.  Respiratory: Negative for shortness of breath.  Cardiovascular: Negative for chest pain.  Gastrointestinal: Negative for abdominal pain, nausea and vomiting.  Neurological: Positive for numbness. Negative for dizziness, weakness and headaches.  All other systems reviewed and are negative.    Physical Exam Updated Vital Signs BP (!) 122/93   Pulse (!) 56   Temp 98.4 F (36.9 C)   Resp 18   Ht 5' 11"  (1.803 m)   Wt 85.3 kg (188 lb)   SpO2 97%   BMI 26.22 kg/m   Physical Exam  Constitutional: He is oriented to person, place, and time. He appears well-developed and well-nourished. No distress.  HENT:  Head: Normocephalic and atraumatic.  Eyes: Pupils are equal, round, and reactive to  light.  Neck: Neck supple.  Cardiovascular: Normal rate, regular rhythm and normal heart sounds.   No murmur heard. Pulmonary/Chest: Effort normal and breath sounds normal. No respiratory distress. He has no wheezes.  Abdominal: Soft. Bowel sounds are normal. There is no tenderness. There is no rebound.  Musculoskeletal: He exhibits no edema.  Neurological: He is alert and oriented to person, place, and time.  Cranial nerve II through XII intact, 5 out of 5 strength in all 4 extremities, no dysmetria to finger-nose-finger, no drift, sensation grossly intact with the exception of a small area just inferior to the eye on the left side of the face  Skin: Skin is warm and dry.  Psychiatric: He has a normal mood and affect.  Nursing note and vitals reviewed.    ED Treatments / Results  Labs (all labs ordered are listed, but only abnormal results are displayed) Labs Reviewed  COMPREHENSIVE METABOLIC PANEL - Abnormal; Notable for the following:       Result Value   Potassium 3.4 (*)    Glucose, Bld 122 (*)    Creatinine, Ser 1.27 (*)    All other components within normal limits  URINALYSIS, ROUTINE W REFLEX MICROSCOPIC - Abnormal; Notable for the following:    Hgb urine dipstick SMALL (*)    Squamous Epithelial / LPF 0-5 (*)    All other components within normal limits  I-STAT CHEM 8, ED - Abnormal; Notable for the following:    BUN 24 (*)    Glucose, Bld 115 (*)    Calcium, Ion 1.10 (*)    All other components within normal limits  ETHANOL  PROTIME-INR  APTT  CBC  DIFFERENTIAL  RAPID URINE DRUG SCREEN, HOSP PERFORMED  I-STAT TROPONIN, ED    EKG  EKG Interpretation  Date/Time:  Sunday December 24 2016 23:18:43 EDT Ventricular Rate:  67 PR Interval:    QRS Duration: 98 QT Interval:  429 QTC Calculation: 453 R Axis:   51 Text Interpretation:  Sinus rhythm Abnormal R-wave progression, early transition No significant change since last tracing Confirmed by Thayer Jew  [phone]) on 12/24/2016 11:49:20 PM       Radiology Ct Angio Head W Or Wo Contrast  Result Date: 12/25/2016 CLINICAL DATA:  LEFT facial numbness and slurred speech. History of stroke, hypertension. EXAM: CT ANGIOGRAPHY HEAD AND NECK TECHNIQUE: Multidetector CT imaging of the head and neck was performed using the standard protocol during bolus administration of intravenous contrast. Multiplanar CT image reconstructions and MIPs were obtained to evaluate the vascular anatomy. Carotid stenosis measurements (when applicable) are obtained utilizing NASCET criteria, using the distal internal carotid diameter as the denominator. CONTRAST:  100 cc Isovue 370 COMPARISON:  CT HEAD December 24, 2016 EXAM: CTA NECK AORTIC ARCH: Normal appearance of the thoracic  arch, 2 vessel arch is a normal variant. The origins of the innominate, left Common carotid artery and subclavian artery are widely patent. RIGHT CAROTID SYSTEM: Common carotid artery is widely patent, coursing in a straight line fashion. Normal appearance of the carotid bifurcation without hemodynamically significant stenosis by NASCET criteria. Normal appearance of the included internal carotid artery. LEFT CAROTID SYSTEM: Common carotid artery is widely patent, coursing in a straight line fashion. Normal appearance of the carotid bifurcation without hemodynamically significant stenosis by NASCET criteria. Normal appearance of the included internal carotid artery. VERTEBRAL ARTERIES:Left vertebral artery is dominant. Normal appearance of the vertebral arteries, which appear widely patent. SKELETON: No acute osseous process though bone windows have not been submitted. OTHER NECK: Soft tissues of the neck are nonacute though, not tailored for evaluation. UPPER CHEST: Included lung apices are clear. No superior mediastinal lymphadenopathy. CTA HEAD ANTERIOR CIRCULATION: Patent cervical internal carotid arteries, petrous, cavernous and supra clinoid internal carotid  arteries. Widely patent anterior communicating artery. Patent anterior and middle cerebral arteries. No large vessel occlusion, significant stenosis, contrast extravasation or aneurysm. POSTERIOR CIRCULATION: Patent vertebral arteries, vertebrobasilar junction and basilar artery, as well as main branch vessels. Patent posterior cerebral arteries. Bilateral posterior communicating arteries present. No large vessel occlusion, significant stenosis, contrast extravasation or aneurysm. VENOUS SINUSES: Major dural venous sinuses are patent though not tailored for evaluation on this angiographic examination. ANATOMIC VARIANTS: Supernumerary anterior cerebral artery arising from LEFT A1-2 junction. DELAYED PHASE: No abnormal intracranial enhancement. MIP images reviewed. IMPRESSION: Negative CTA HEAD and neck. Electronically Signed   By: Elon Alas M.D.   On: 12/25/2016 01:58   Ct Head Wo Contrast  Result Date: 12/24/2016 CLINICAL DATA:  Left facial tingling and left arm tingling starting today around 9 a.m. EXAM: CT HEAD WITHOUT CONTRAST TECHNIQUE: Contiguous axial images were obtained from the base of the skull through the vertex without intravenous contrast. COMPARISON:  None. FINDINGS: Brain: Mild diffuse cerebral atrophy. No ventricular dilatation. There is focal vague asymmetric low-attenuation in the right posterior parietal white matter. This is nonspecific but could indicate early changes of acute ischemia. Consider MRI if clinically indicated. No mass effect or midline shift. No abnormal extra-axial fluid collections. Gray-white matter junctions are distinct. Basal cisterns are not effaced. No acute intracranial hemorrhage. Vascular: No hyperdense vessel or unexpected calcification. Skull: The calvarium appears intact. Sinuses/Orbits: Retention cysts in the ethmoid air cells. No acute air-fluid levels. Mastoid air cells are not opacified. Other: None. IMPRESSION: Possible early acute ischemic changes in  the right parietal white matter. Consider MRI for further evaluation if clinically indicated. No acute intracranial hemorrhage or mass effect. Electronically Signed   By: Lucienne Capers M.D.   On: 12/24/2016 23:55   Ct Angio Neck W And/or Wo Contrast  Result Date: 12/25/2016 CLINICAL DATA:  LEFT facial numbness and slurred speech. History of stroke, hypertension. EXAM: CT ANGIOGRAPHY HEAD AND NECK TECHNIQUE: Multidetector CT imaging of the head and neck was performed using the standard protocol during bolus administration of intravenous contrast. Multiplanar CT image reconstructions and MIPs were obtained to evaluate the vascular anatomy. Carotid stenosis measurements (when applicable) are obtained utilizing NASCET criteria, using the distal internal carotid diameter as the denominator. CONTRAST:  100 cc Isovue 370 COMPARISON:  CT HEAD December 24, 2016 EXAM: CTA NECK AORTIC ARCH: Normal appearance of the thoracic arch, 2 vessel arch is a normal variant. The origins of the innominate, left Common carotid artery and subclavian artery are widely patent. RIGHT CAROTID  SYSTEM: Common carotid artery is widely patent, coursing in a straight line fashion. Normal appearance of the carotid bifurcation without hemodynamically significant stenosis by NASCET criteria. Normal appearance of the included internal carotid artery. LEFT CAROTID SYSTEM: Common carotid artery is widely patent, coursing in a straight line fashion. Normal appearance of the carotid bifurcation without hemodynamically significant stenosis by NASCET criteria. Normal appearance of the included internal carotid artery. VERTEBRAL ARTERIES:Left vertebral artery is dominant. Normal appearance of the vertebral arteries, which appear widely patent. SKELETON: No acute osseous process though bone windows have not been submitted. OTHER NECK: Soft tissues of the neck are nonacute though, not tailored for evaluation. UPPER CHEST: Included lung apices are clear.  No superior mediastinal lymphadenopathy. CTA HEAD ANTERIOR CIRCULATION: Patent cervical internal carotid arteries, petrous, cavernous and supra clinoid internal carotid arteries. Widely patent anterior communicating artery. Patent anterior and middle cerebral arteries. No large vessel occlusion, significant stenosis, contrast extravasation or aneurysm. POSTERIOR CIRCULATION: Patent vertebral arteries, vertebrobasilar junction and basilar artery, as well as main branch vessels. Patent posterior cerebral arteries. Bilateral posterior communicating arteries present. No large vessel occlusion, significant stenosis, contrast extravasation or aneurysm. VENOUS SINUSES: Major dural venous sinuses are patent though not tailored for evaluation on this angiographic examination. ANATOMIC VARIANTS: Supernumerary anterior cerebral artery arising from LEFT A1-2 junction. DELAYED PHASE: No abnormal intracranial enhancement. MIP images reviewed. IMPRESSION: Negative CTA HEAD and neck. Electronically Signed   By: Elon Alas M.D.   On: 12/25/2016 01:58    Procedures Procedures (including critical care time)  Medications Ordered in ED Medications  iopamidol (ISOVUE-370) 76 % injection 100 mL (100 mLs Intravenous Contrast Given 12/25/16 0111)     Initial Impression / Assessment and Plan / ED Course  I have reviewed the triage vital signs and the nursing notes.  Pertinent labs & imaging results that were available during my care of the patient were reviewed by me and considered in my medical decision making (see chart for details).  Clinical Course as of Dec 26 210  Mon Dec 25, 2016  0103 Discussed with Dr.  Lorraine Lax.  Recommend CT angiographic and history of vasculitis. Will touch basefollowing results. Patient is at his baseline. No neurologic deficits at this time.  [CH]  0211 CTA neg.  Discussed with Dr. Lorraine Lax.  Patient has reviewed the imaging.Feels he can be admitted up here for MRI and further stroke  workup.  [CH]    Clinical Course User Index [CH] Kord Monette, Barbette Hair, MD    Patient presents with left facial numbness and left hand tingling. He is nontoxic on exam. Objectively no obvious neurologic deficit; however, reports sensory abnormalities on the left face. While in the ED these resolved. He reports history of TIA. Unfortunately, outside records sent do not reflect this admission. Unclear what his workup was. Initial CT scan shows possible early acute stroke changes in the right parietal region. Discussed with neurologist Zacarias Pontes. See above. Patient has remained symptom free. CTA is negative.  Will admit for MRI and further workup at [facility]  Final Clinical Impressions(s) / ED Diagnoses   Final diagnoses:  Cerebrovascular accident (CVA) due to other mechanism [facility])    New Prescriptions New Prescriptions   No medications on file     Merryl Hacker, MD 12/25/16 [phone]

## 2016-12-25 ENCOUNTER — Encounter (HOSPITAL_COMMUNITY): Payer: Self-pay

## 2016-12-25 ENCOUNTER — Observation Stay (HOSPITAL_BASED_OUTPATIENT_CLINIC_OR_DEPARTMENT_OTHER): Payer: Medicare Other

## 2016-12-25 ENCOUNTER — Observation Stay (HOSPITAL_COMMUNITY): Payer: Medicare Other

## 2016-12-25 ENCOUNTER — Emergency Department (HOSPITAL_COMMUNITY): Payer: Medicare Other

## 2016-12-25 DIAGNOSIS — I10 Essential (primary) hypertension: Secondary | ICD-10-CM

## 2016-12-25 DIAGNOSIS — E663 Overweight: Secondary | ICD-10-CM

## 2016-12-25 DIAGNOSIS — R202 Paresthesia of skin: Secondary | ICD-10-CM | POA: Diagnosis not present

## 2016-12-25 DIAGNOSIS — G459 Transient cerebral ischemic attack, unspecified: Secondary | ICD-10-CM | POA: Diagnosis not present

## 2016-12-25 DIAGNOSIS — R4781 Slurred speech: Secondary | ICD-10-CM | POA: Diagnosis not present

## 2016-12-25 LAB — RAPID URINE DRUG SCREEN, HOSP PERFORMED
AMPHETAMINES: NOT DETECTED
BENZODIAZEPINES: NOT DETECTED
Barbiturates: NOT DETECTED
COCAINE: NOT DETECTED
OPIATES: NOT DETECTED
Tetrahydrocannabinol: NOT DETECTED

## 2016-12-25 LAB — COMPREHENSIVE METABOLIC PANEL
ALBUMIN: 4.3 g/dL (ref 3.5–5.0)
ALT: 38 U/L (ref 17–63)
ANION GAP: 8 (ref 5–15)
AST: 27 U/L (ref 15–41)
Alkaline Phosphatase: 69 U/L (ref 38–126)
BILIRUBIN TOTAL: 0.7 mg/dL (ref 0.3–1.2)
BUN: 19 mg/dL (ref 6–20)
CO2: 30 mmol/L (ref 22–32)
Calcium: 9.4 mg/dL (ref 8.9–10.3)
Chloride: 105 mmol/L (ref 101–111)
Creatinine, Ser: 1.27 mg/dL — ABNORMAL HIGH (ref 0.61–1.24)
GFR calc Af Amer: >60 mL/min (ref >60)
GFR calc non Af Amer: >60 mL/min (ref >60)
GLUCOSE: 122 mg/dL — AB (ref 65–99)
POTASSIUM: 3.4 mmol/L — AB (ref 3.5–5.1)
Sodium: 143 mmol/L (ref 135–145)
TOTAL PROTEIN: 7.8 g/dL (ref 6.5–8.1)

## 2016-12-25 LAB — LIPID PANEL
CHOL/HDL RATIO: 3 ratio
Cholesterol: 88 mg/dL (ref 0–200)
HDL: 29 mg/dL — AB (ref >40)
LDL Cholesterol: 47 mg/dL (ref 0–99)
TRIGLYCERIDES: 61 mg/dL (ref <150)
VLDL: 12 mg/dL (ref 0–40)

## 2016-12-25 LAB — CBC
HCT: 40.9 % (ref 39.0–52.0)
HEMOGLOBIN: 14.2 g/dL (ref 13.0–17.0)
MCH: 30.7 pg (ref 26.0–34.0)
MCHC: 34.7 g/dL (ref 30.0–36.0)
MCV: 88.3 fL (ref 78.0–100.0)
PLATELETS: 278 10*3/uL (ref 150–400)
RBC: 4.63 MIL/uL (ref 4.22–5.81)
RDW: 12.4 % (ref 11.5–15.5)
WBC: 8.4 10*3/uL (ref 4.0–10.5)

## 2016-12-25 LAB — PROTIME-INR
INR: 0.96
PROTHROMBIN TIME: 12.7 s (ref 11.4–15.2)

## 2016-12-25 LAB — DIFFERENTIAL
Basophils Absolute: 0 10*3/uL (ref 0.0–0.1)
Basophils Relative: 0 %
EOS ABS: 0.3 10*3/uL (ref 0.0–0.7)
EOS PCT: 3 %
LYMPHS ABS: 2.5 10*3/uL (ref 0.7–4.0)
Lymphocytes Relative: 30 %
Monocytes Absolute: 0.9 10*3/uL (ref 0.1–1.0)
Monocytes Relative: 10 %
NEUTROS PCT: 57 %
Neutro Abs: 4.7 10*3/uL (ref 1.7–7.7)

## 2016-12-25 LAB — URINALYSIS, ROUTINE W REFLEX MICROSCOPIC
BACTERIA UA: NONE SEEN
Bilirubin Urine: NEGATIVE
Glucose, UA: NEGATIVE mg/dL
Ketones, ur: NEGATIVE mg/dL
Leukocytes, UA: NEGATIVE
Nitrite: NEGATIVE
Protein, ur: NEGATIVE mg/dL
Specific Gravity, Urine: 1.013 (ref 1.005–1.030)
pH: 6 (ref 5.0–8.0)

## 2016-12-25 LAB — HEMOGLOBIN A1C
HEMOGLOBIN A1C: 5.5 % (ref 4.8–5.6)
MEAN PLASMA GLUCOSE: 111.15 mg/dL

## 2016-12-25 LAB — TSH: TSH: 1.017 u[IU]/mL (ref 0.350–4.500)

## 2016-12-25 LAB — VITAMIN B12: VITAMIN B 12: 327 pg/mL (ref 180–914)

## 2016-12-25 LAB — ECHOCARDIOGRAM COMPLETE
Height: 71 in
WEIGHTICAEL: 3008 [oz_av]

## 2016-12-25 LAB — ETHANOL

## 2016-12-25 LAB — APTT: aPTT: 27 seconds (ref 24–36)

## 2016-12-25 MED ORDER — ROSUVASTATIN CALCIUM 20 MG PO TABS
20.0000 mg | ORAL_TABLET | Freq: Every day | ORAL | Status: DC
  Administered 2016-12-25: 20 mg via ORAL
  Filled 2016-12-25: qty 1

## 2016-12-25 MED ORDER — ASPIRIN EC 81 MG PO TBEC: 81.0000 mg | DELAYED_RELEASE_TABLET | Freq: Every day | ORAL | 0 refills | Status: DC

## 2016-12-25 MED ORDER — IOPAMIDOL (ISOVUE-370) INJECTION 76%
100.0000 mL | Freq: Once | INTRAVENOUS | Status: AC | PRN
  Administered 2016-12-25: 100 mL via INTRAVENOUS

## 2016-12-25 MED ORDER — ACETAMINOPHEN 325 MG PO TABS: 650.0000 mg | ORAL_TABLET | ORAL | Status: DC | PRN

## 2016-12-25 MED ORDER — AMLODIPINE BESYLATE 5 MG PO TABS
10.0000 mg | ORAL_TABLET | Freq: Every day | ORAL | Status: DC
Start: 2016-12-25 — End: 2016-12-25

## 2016-12-25 MED ORDER — SENNOSIDES-DOCUSATE SODIUM 8.6-50 MG PO TABS: 1.0000 | ORAL_TABLET | Freq: Every evening | ORAL | Status: DC | PRN

## 2016-12-25 MED ORDER — SODIUM CHLORIDE 0.9 % IV SOLN
INTRAVENOUS | Status: AC
  Administered 2016-12-25: 04:00:00 via INTRAVENOUS

## 2016-12-25 MED ORDER — STROKE: EARLY STAGES OF RECOVERY BOOK
Freq: Once | Status: AC
  Administered 2016-12-25: 10:00:00
  Filled 2016-12-25: qty 1

## 2016-12-25 MED ORDER — HYDROCHLOROTHIAZIDE 25 MG PO TABS
12.5000 mg | ORAL_TABLET | Freq: Every day | ORAL | Status: DC
  Administered 2016-12-25: 12.5 mg via ORAL
  Filled 2016-12-25: qty 1

## 2016-12-25 MED ORDER — LISINOPRIL 10 MG PO TABS
40.0000 mg | ORAL_TABLET | Freq: Every day | ORAL | Status: DC
  Administered 2016-12-25: 40 mg via ORAL
  Filled 2016-12-25: qty 4

## 2016-12-25 MED ORDER — ACETAMINOPHEN 650 MG RE SUPP
650.0000 mg | RECTAL | Status: DC | PRN
Start: 2016-12-25 — End: 2016-12-25

## 2016-12-25 MED ORDER — ASPIRIN 325 MG PO TABS
325.0000 mg | ORAL_TABLET | Freq: Every day | ORAL | Status: DC
  Filled 2016-12-25: qty 1

## 2016-12-25 MED ORDER — HYDRALAZINE HCL 20 MG/ML IJ SOLN: 10.0000 mg | Freq: Three times a day (TID) | INTRAMUSCULAR | Status: DC | PRN

## 2016-12-25 MED ORDER — HEPARIN SODIUM (PORCINE) 5000 UNIT/ML IJ SOLN
5000.0000 [IU] | Freq: Three times a day (TID) | INTRAMUSCULAR | Status: DC
Start: 2016-12-25 — End: 2016-12-25
  Administered 2016-12-25: 5000 [IU] via SUBCUTANEOUS
  Filled 2016-12-25: qty 1

## 2016-12-25 MED ORDER — ASPIRIN 81 MG PO CHEW
324.0000 mg | CHEWABLE_TABLET | Freq: Once | ORAL | Status: AC
  Administered 2016-12-25: 324 mg via ORAL
  Filled 2016-12-25: qty 4

## 2016-12-25 MED ORDER — ACETAMINOPHEN 160 MG/5ML PO SOLN
650.0000 mg | ORAL | Status: DC | PRN
Start: 2016-12-25 — End: 2016-12-25
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
## 2016-12-25 NOTE — Progress Notes (Signed)
Patient off floor for MRI, unable to do Vital signs and Neuro checks at this time.

## 2016-12-25 NOTE — Progress Notes (Signed)
*  PRELIMINARY RESULTS* Echocardiogram 2D Echocardiogram has been performed.  Jeryl Columbia 12/25/2016, 2:03 PM
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
## 2016-12-25 NOTE — Progress Notes (Signed)
SLP Cancellation Note  Patient Details Name: Shannon Wilson MRN: [id number] DOB: Jul 28, 1970   Cancelled treatment:       Reason Eval/Treat Not Completed: SLP screened, no needs identified, will sign off; SLP screened Pt in room. Pt denies any changes in swallowing, speech, language, or cognition. MRI negative for acute changes. SLE will be deferred at this time. Reconsult if indicated. SLP will sign off.   Thank you,  Havery Moros, CCC-SLP [phone]  [name] 12/25/2016, 11:03 AM

## 2016-12-25 NOTE — Discharge Summary (Signed)
Discharge Summary  Redding Cloe WJX:[id number] DOB: 1971/02/23  PCP: Raylene Everts, MD  Admit date: 12/24/2016 Discharge date: 12/25/2016  Time spent: 25 minutes   Recommendations for Outpatient Follow-up:  1. New medication: Aspirin 81 mg by mouth daily 2. Patient will follow up with his PCP in the next one month   Discharge Diagnoses:  Active Hospital Problems   Diagnosis Date Noted  . TIA (transient ischemic attack) 12/25/2016  Essential hypertension  Resolved Hospital Problems   Diagnosis Date Noted Date Resolved  No resolved problems to display.    Discharge Condition: improved, being discharged home   Diet recommendation: low-sodium   Vitals:   12/25/16 1404 12/25/16 1454  BP: 124/75   Pulse: (!) 52   Resp:    Temp: 98.3 F (36.8 C)   SpO2: 99% 98%    History of present illness:  46 year old male with past mental history of  And reported MI and questionable TIA in the past presented to the emergency room on the night of 9/16 complaining of left arm weakness and left-sided facial tingling. Patient had a similar episode in the past but that time was [place] profound. Patient states he was not doing anything when all of a sudden this happened 9:30 morning. After a few t away. Then it came  That evening. Patient was not doing anything  When this occurred. He became concerned so came into the emergency room. CT scan of the head as well as CT angiogram of the head and neck were unremarkable. Case discussed with neurology who recommended admission, evaluation and MRI.   Hospital Course:  Active Problems:   TIA (transient ischemic attack): MRI of head unremarkable. Patient's blood pressures were stable. Patient had full stroke workup including lipid panel which noted an LDL of in the 40s with target  Normal A1c of 5.5. By time patient had been in the emergency room, symptoms already resolved. Echocardiogram and carotid Dopplers negative as well.  With these findings, it was  felt that a new TIA was possible given the fact that despite patient's young age, he had a previous history of MI and possible TIA. Therefore, placed on daily aspirin. Patient with no physical impairments and no swallowing and so felt to be stable for discharge   Procedures:  Echocardiogram done 9/17: Preserved ejection fraction, no valvular dysfunction.  Carotid Dopplers done 9/17: No significant carotid artery stenosis bilaterally  Consultations:  Case discussed with neurology on admission   Discharge Exam: BP 124/75 (BP Location: Right Arm)   Pulse (!) 52   Temp 98.3 F (36.8 C) (Oral)   Resp 18   Ht 5' 11"  (1.803 m)   Wt 85.3 kg (188 lb)   SpO2 98%   BMI 26.22 kg/m   General: alert and oriented 3, no acute distress  Cardiovascular: regular rate and rhythm, S1 and S2  Respiratory: clear to auscultation bilaterally   Discharge Instructions You were cared for by a hospitalist during your hospital stay. If you have any questions about your discharge medications or the care you received while you were in the hospital after you are discharged, you can call the unit and asked to speak with the hospitalist on call if the hospitalist that took care of you is not available. Once you are discharged, your primary care physician will handle any further medical issues. Please note that NO REFILLS for any discharge medications will be authorized once you are discharged, as it is imperative that you return to your  primary care physician (or establish a relationship with a primary care physician if you do not have one) for your aftercare needs so that they can reassess your need for medications and monitor your lab values.  Discharge Instructions    Diet - low sodium heart healthy    Complete by:  As directed    Increase activity slowly    Complete by:  As directed      Allergies as of 12/25/2016   No Known Allergies     Medication List    TAKE these medications   amLODipine 10 MG  tablet Commonly known as:  NORVASC Take 1 tablet (10 mg total) by mouth daily.   aspirin EC 81 MG tablet Take 1 tablet (81 mg total) by mouth daily.   gabapentin 300 MG capsule Commonly known as:  NEURONTIN Take 300 mg by mouth as needed.   hydrochlorothiazide 12.5 MG tablet Commonly known as:  HYDRODIURIL Take 1 tablet (12.5 mg total) by mouth daily.   lisinopril 40 MG tablet Commonly known as:  PRINIVIL,ZESTRIL Take 1 tablet (40 mg total) by mouth daily.   rosuvastatin 20 MG tablet Commonly known as:  CRESTOR Take 1 tablet (20 mg total) by mouth daily.            Discharge Care Instructions        Start     Ordered   12/25/16 0000  aspirin EC 81 MG tablet  Daily     12/25/16 1540   12/25/16 0000  Increase activity slowly     12/25/16 1540   12/25/16 0000  Diet - low sodium heart healthy     12/25/16 1540     No Known Allergies    The results of significant diagnostics from this hospitalization (including imaging, microbiology, ancillary and laboratory) are listed below for reference.    Significant Diagnostic Studies: Ct Angio Head W Or Wo Contrast  Result Date: 12/25/2016 CLINICAL DATA:  LEFT facial numbness and slurred speech. History of stroke, hypertension. EXAM: CT ANGIOGRAPHY HEAD AND NECK TECHNIQUE: Multidetector CT imaging of the head and neck was performed using the standard protocol during bolus administration of intravenous contrast. Multiplanar CT image reconstructions and MIPs were obtained to evaluate the vascular anatomy. Carotid stenosis measurements (when applicable) are obtained utilizing NASCET criteria, using the distal internal carotid diameter as the denominator. CONTRAST:  100 cc Isovue 370 COMPARISON:  CT HEAD December 24, 2016 EXAM: CTA NECK AORTIC ARCH: Normal appearance of the thoracic arch, 2 vessel arch is a normal variant. The origins of the innominate, left Common carotid artery and subclavian artery are widely patent. RIGHT CAROTID  SYSTEM: Common carotid artery is widely patent, coursing in a straight line fashion. Normal appearance of the carotid bifurcation without hemodynamically significant stenosis by NASCET criteria. Normal appearance of the included internal carotid artery. LEFT CAROTID SYSTEM: Common carotid artery is widely patent, coursing in a straight line fashion. Normal appearance of the carotid bifurcation without hemodynamically significant stenosis by NASCET criteria. Normal appearance of the included internal carotid artery. VERTEBRAL ARTERIES:Left vertebral artery is dominant. Normal appearance of the vertebral arteries, which appear widely patent. SKELETON: No acute osseous process though bone windows have not been submitted. OTHER NECK: Soft tissues of the neck are nonacute though, not tailored for evaluation. UPPER CHEST: Included lung apices are clear. No superior mediastinal lymphadenopathy. CTA HEAD ANTERIOR CIRCULATION: Patent cervical internal carotid arteries, petrous, cavernous and supra clinoid internal carotid arteries. Widely patent anterior communicating artery. Patent  anterior and middle cerebral arteries. No large vessel occlusion, significant stenosis, contrast extravasation or aneurysm. POSTERIOR CIRCULATION: Patent vertebral arteries, vertebrobasilar junction and basilar artery, as well as main branch vessels. Patent posterior cerebral arteries. Bilateral posterior communicating arteries present. No large vessel occlusion, significant stenosis, contrast extravasation or aneurysm. VENOUS SINUSES: Major dural venous sinuses are patent though not tailored for evaluation on this angiographic examination. ANATOMIC VARIANTS: Supernumerary anterior cerebral artery arising from LEFT A1-2 junction. DELAYED PHASE: No abnormal intracranial enhancement. MIP images reviewed. IMPRESSION: Negative CTA HEAD and neck. Electronically Signed   By: Elon Alas M.D.   On: 12/25/2016 01:58   Ct Head Wo  Contrast  Result Date: 12/24/2016 CLINICAL DATA:  Left facial tingling and left arm tingling starting today around 9 a.m. EXAM: CT HEAD WITHOUT CONTRAST TECHNIQUE: Contiguous axial images were obtained from the base of the skull through the vertex without intravenous contrast. COMPARISON:  None. FINDINGS: Brain: Mild diffuse cerebral atrophy. No ventricular dilatation. There is focal vague asymmetric low-attenuation in the right posterior parietal white matter. This is nonspecific but could indicate early changes of acute ischemia. Consider MRI if clinically indicated. No mass effect or midline shift. No abnormal extra-axial fluid collections. Gray-white matter junctions are distinct. Basal cisterns are not effaced. No acute intracranial hemorrhage. Vascular: No hyperdense vessel or unexpected calcification. Skull: The calvarium appears intact. Sinuses/Orbits: Retention cysts in the ethmoid air cells. No acute air-fluid levels. Mastoid air cells are not opacified. Other: None. IMPRESSION: Possible early acute ischemic changes in the right parietal white matter. Consider MRI for further evaluation if clinically indicated. No acute intracranial hemorrhage or mass effect. Electronically Signed   By: Lucienne Capers M.D.   On: 12/24/2016 23:55   Ct Angio Neck W And/or Wo Contrast  Result Date: 12/25/2016 CLINICAL DATA:  LEFT facial numbness and slurred speech. History of stroke, hypertension. EXAM: CT ANGIOGRAPHY HEAD AND NECK TECHNIQUE: Multidetector CT imaging of the head and neck was performed using the standard protocol during bolus administration of intravenous contrast. Multiplanar CT image reconstructions and MIPs were obtained to evaluate the vascular anatomy. Carotid stenosis measurements (when applicable) are obtained utilizing NASCET criteria, using the distal internal carotid diameter as the denominator. CONTRAST:  100 cc Isovue 370 COMPARISON:  CT HEAD December 24, 2016 EXAM: CTA NECK AORTIC ARCH:  Normal appearance of the thoracic arch, 2 vessel arch is a normal variant. The origins of the innominate, left Common carotid artery and subclavian artery are widely patent. RIGHT CAROTID SYSTEM: Common carotid artery is widely patent, coursing in a straight line fashion. Normal appearance of the carotid bifurcation without hemodynamically significant stenosis by NASCET criteria. Normal appearance of the included internal carotid artery. LEFT CAROTID SYSTEM: Common carotid artery is widely patent, coursing in a straight line fashion. Normal appearance of the carotid bifurcation without hemodynamically significant stenosis by NASCET criteria. Normal appearance of the included internal carotid artery. VERTEBRAL ARTERIES:Left vertebral artery is dominant. Normal appearance of the vertebral arteries, which appear widely patent. SKELETON: No acute osseous process though bone windows have not been submitted. OTHER NECK: Soft tissues of the neck are nonacute though, not tailored for evaluation. UPPER CHEST: Included lung apices are clear. No superior mediastinal lymphadenopathy. CTA HEAD ANTERIOR CIRCULATION: Patent cervical internal carotid arteries, petrous, cavernous and supra clinoid internal carotid arteries. Widely patent anterior communicating artery. Patent anterior and middle cerebral arteries. No large vessel occlusion, significant stenosis, contrast extravasation or aneurysm. POSTERIOR CIRCULATION: Patent vertebral arteries, vertebrobasilar junction and basilar artery,  as well as main branch vessels. Patent posterior cerebral arteries. Bilateral posterior communicating arteries present. No large vessel occlusion, significant stenosis, contrast extravasation or aneurysm. VENOUS SINUSES: Major dural venous sinuses are patent though not tailored for evaluation on this angiographic examination. ANATOMIC VARIANTS: Supernumerary anterior cerebral artery arising from LEFT A1-2 junction. DELAYED PHASE: No abnormal  intracranial enhancement. MIP images reviewed. IMPRESSION: Negative CTA HEAD and neck. Electronically Signed   By: Elon Alas M.D.   On: 12/25/2016 01:58   Mr Brain Wo Contrast  Result Date: 12/25/2016 CLINICAL DATA:  Left facial tingling. Left arm tingling. Symptoms started yesterday. TIA workup. EXAM: MRI HEAD WITHOUT CONTRAST TECHNIQUE: Multiplanar, multiecho pulse sequences of the brain and surrounding structures were obtained without intravenous contrast. COMPARISON:  CT and CTA from yesterday. FINDINGS: Brain: No acute infarction, hemorrhage, hydrocephalus, extra-axial collection or mass lesion. Rare FLAIR hyperintensities in the cerebral white matter attributed to nonspecific remote insults. No specific demyelinating pattern. Normal brain volume. Vascular: Major vessels are patent Skull and upper cervical spine: Negative for marrow lesion Sinuses/Orbits: Overall mild patchy mucosal thickening in the paranasal sinuses -mainly right ethmoid and left maxillary. Other: Intermittent motion degradation. IMPRESSION: Negative exam; no explanation for symptoms. Electronically Signed   By: Monte Fantasia M.D.   On: 12/25/2016 09:31   US Carotid Bilateral (at [facility] Only)  Result Date: 12/25/2016 CLINICAL DATA:  TIA EXAM: BILATERAL CAROTID DUPLEX ULTRASOUND TECHNIQUE: Pearline Cables scale imaging, color Doppler and duplex ultrasound was performed of bilateral carotid and vertebral arteries in the neck. COMPARISON:  12/25/2016 from earlier today TECHNIQUE: Quantification of carotid stenosis is based on velocity parameters that correlate the residual internal carotid diameter with NASCET-based stenosis levels, using the diameter of the distal internal carotid lumen as the denominator for stenosis measurement. The following velocity measurements were obtained: PEAK SYSTOLIC/END DIASTOLIC RIGHT ICA:                     86/27cm/sec CCA:                     00/71QR/FXJ SYSTOLIC ICA/CCA RATIO:  1.1 DIASTOLIC ICA/CCA  RATIO: 1.5 ECA:                     88cm/sec LEFT ICA:                     83/34cm/sec CCA:                     88/32PQ/DIY SYSTOLIC ICA/CCA RATIO:  0.9 DIASTOLIC ICA/CCA RATIO: 1.6 ECA:                     89cm/sec FINDINGS: RIGHT CAROTID ARTERY: No significant plaque or stenosis. No dissection. Normal waveforms and color Doppler signal. RIGHT VERTEBRAL ARTERY:  Normal flow direction and waveform. LEFT CAROTID ARTERY: No significant plaque or stenosis. No dissection. Normal waveforms and color Doppler signal. LEFT VERTEBRAL ARTERY: Normal flow direction and waveform. IMPRESSION: Negative Electronically Signed   By: Lucrezia Europe M.D.   On: 12/25/2016 10:04    Microbiology: No results found for this or any previous visit (from the past 240 hour(s)).   Labs: Basic Metabolic Panel:  Recent Labs Lab 12/24/16 2333 12/24/16 2338  NA 143 144  K 3.4* 3.7  CL 105 106  CO2 30  --   GLUCOSE 122* 115*  BUN 19 24*  CREATININE 1.27* 1.20  CALCIUM 9.4  --  Liver Function Tests:  Recent Labs Lab 12/24/16 2333  AST 27  ALT 38  ALKPHOS 69  BILITOT 0.7  PROT 7.8  ALBUMIN 4.3   No results for input(s): LIPASE, AMYLASE in the last 168 hours. No results for input(s): AMMONIA in the last 168 hours. CBC:  Recent Labs Lab 12/24/16 2333 12/24/16 2338  WBC 8.4  --   NEUTROABS 4.7  --   HGB 14.2 13.6  HCT 40.9 40.0  MCV 88.3  --   PLT 278  --    Cardiac Enzymes: No results for input(s): CKTOTAL, CKMB, CKMBINDEX, TROPONINI in the last 168 hours. BNP: BNP (last 3 results) No results for input(s): BNP in the last 8760 hours.  ProBNP (last 3 results) No results for input(s): PROBNP in the last 8760 hours.  CBG: No results for input(s): GLUCAP in the last 168 hours.     Signed:  Annita Brod, MD [facility] 12/25/2016, 3:40 PM

## 2016-12-25 NOTE — Progress Notes (Signed)
Pt discharged in stable condition via wheelchair into the care of his family via private vehicle.  PIV removed intact w/o S&S of complications.  Discharge instructions reviewed with pt/family.  Pt/family verbalized understanding.

## 2016-12-25 NOTE — Care Management Obs Status (Signed)
MEDICARE OBSERVATION STATUS NOTIFICATION   Patient Details  Name: Shannon Wilson MRN: [id number] Date of Birth: Sep 11, 1970   Medicare Observation Status Notification Given:  Yes    Malcolm Metro, RN 12/25/2016, 2:59 PM

## 2016-12-25 NOTE — Care Management Note (Signed)
Case Management Note  Patient Details  Name: Shannon Wilson MRN: [id number] Date of Birth: 1970-04-29  Subjective/Objective:                  Admitted with TIA. Pt seen to give MOON, chart reviewed for CM needs. Pt from home, ind. Has PCP and medicare. Pt unsure if he has drug coverage. PT has seen pt has recommends no f/u. Pt communicates no needs or DC concerns.   Action/Plan: DC home with self care.   Expected Discharge Date:    12/26/2016              Expected Discharge Plan:  Home/Self Care  In-House Referral:  NA  Discharge planning Services  CM Consult  Post Acute Care Choice:  NA Choice offered to:  NA  Status of Service:  Completed, signed off  Malcolm Metro, RN 12/25/2016, 2:59 PM

## 2016-12-25 NOTE — Progress Notes (Signed)
OT Screen Note  Patient Details Name: Shannon Wilson MRN: [id number] DOB: 05/19/1970   Cancelled Treatment:    Reason Eval/Treat Not Completed: OT screened, no needs identified, will sign off. Spoke with patient this AM who reports that all left side weakness has almost completely resolved. No other deficits reported. Pt voices no concerns at this time. Thank you for the referral.  Limmie Patricia, OTR/L,CBIS  [phone] 12/25/2016, 8:36 AM

## 2016-12-25 NOTE — Consult Note (Signed)
Boswell A. Merlene Laughter, MD     [email or web address]          Shannon Wilson is an 46 y.o. male.   ASSESSMENT/PLAN: 1. Transient focal symptoms involving the left upper extremity and left facial region: The differential diagnosis includes transient ischemic attack, multiple sclerosis and partial seizure. Given the patient's risk factor, TIAs most likely. The typical stroke workup has been initiated. Additional labs will also be obtained. Aspirin has been started.  The patient is a 46 year old black male who presents with the acute onset of numbness and tingling involving the left facial region and left upper extremity. The patient had 2 events. Initial event occurred 9:30 AM on yesterday and lasted for less than hour. The second event also occurred yesterday at approximately 9 PM. That event again lasted for less than 1 hour. The patient does not report having associated headaches, dizziness, dysarthria or dysphagia. He does not report having weakness. He does have a history of hypertension but tells me that this has been controlled. He does not report having shortness of breath, chest pain, GI GU symptoms. The review systems is otherwise negative.    GENERAL: He is doing well at this time.  HEENT: Normal  ABDOMEN: soft  EXTREMITIES: No edema   BACK: Normal  SKIN: Normal by inspection.    MENTAL STATUS: Alert and oriented including orientation to his age and the month. Speech, language and cognition are generally intact. Judgment and insight normal.   CRANIAL NERVES: Pupils are equal, round and reactive to light and accomodation; extra ocular movements are full, there is no significant nystagmus; visual fields are full; upper and lower facial muscles are normal in strength and symmetric, there is no flattening of the nasolabial folds; tongue is midline; uvula is midline; shoulder elevation is normal.  MOTOR: Normal tone, bulk and strength; no pronator drift. There is no  drift of the upper extremities or of the lower extremities.  COORDINATION: Left finger to nose is normal, right finger to nose is normal, No rest tremor; no intention tremor; no postural tremor; no bradykinesia.  REFLEXES: Deep tendon reflexes are symmetrical and normal. Babinski reflexes are flexor bilaterally.   SENSATION: Normal to light touch, temperature, and pinprick.      NIH stroke scale 0.   Blood pressure 133/81, pulse (!) 58, temperature 97.9 F (36.6 C), temperature source Oral, resp. rate 18, height _0  (1.803 m), weight 188 lb (85.3 kg), SpO2 100 %.  Past Medical History:  Diagnosis Date  . Back pain   . GERD (gastroesophageal reflux disease)   . Hypertension   . Myocardial infarction [facility])     Past Surgical History:  Procedure Laterality Date  . BACK SURGERY  08/2012  . SPINE SURGERY  08/2012   discectomy    Family History  Problem Relation Age of Onset  . Hypertension Mother   . Heart disease Mother 39  . Cancer Father        prostate  . Sickle cell trait Father   . Sickle cell trait Son   . Cancer Maternal Grandmother        bone  . Cancer Paternal Grandmother   . Cancer Paternal Grandfather        liver  . Learning disabilities Neg Hx     Social History:  reports that he has never smoked. He has never used smokeless tobacco. He reports that he does not drink alcohol or use drugs.  Allergies: No Known Allergies  Medications: Prior to Admission medications   Medication Sig Start Date End Date Taking? Authorizing Provider  amLODipine (NORVASC) 10 MG tablet Take 1 tablet (10 mg total) by mouth daily. 08/18/16   Raylene Everts, MD  gabapentin (NEURONTIN) 300 MG capsule Take 300 mg by mouth as needed.     [provider]  hydrochlorothiazide (HYDRODIURIL) 12.5 MG tablet Take 1 tablet (12.5 mg total) by mouth daily. 08/18/16   Raylene Everts, MD  lisinopril (PRINIVIL,ZESTRIL) 40 MG tablet Take 1 tablet (40 mg total) by mouth  daily. 08/18/16   Raylene Everts, MD  rosuvastatin (CRESTOR) 20 MG tablet Take 1 tablet (20 mg total) by mouth daily. 08/21/16   Raylene Everts, MD    Scheduled Meds: .  stroke: mapping our early stages of recovery book   Does not apply Once  . [START ON 12/26/2016] aspirin  325 mg Oral Daily  . heparin  5,000 Units Subcutaneous Q8H  . hydrochlorothiazide  12.5 mg Oral Daily  . lisinopril  40 mg Oral Daily  . rosuvastatin  20 mg Oral Daily   Continuous Infusions: . sodium chloride 100 mL/hr at 12/25/16 0338   PRN Meds:.acetaminophen **OR** acetaminophen (TYLENOL) oral liquid 160 mg/5 mL **OR** acetaminophen, hydrALAZINE, senna-docusate     Results for orders placed or performed during the hospital encounter of 12/24/16 (from the past 48 hour(s))  Ethanol     Status: None   Collection Time: 12/24/16 11:26 PM  Result Value Ref Range   Alcohol, Ethyl (B) <5 <5 mg/dL    Comment:        LOWEST DETECTABLE LIMIT FOR SERUM ALCOHOL IS 5 mg/dL FOR MEDICAL PURPOSES ONLY   Protime-INR     Status: None   Collection Time: 12/24/16 11:33 PM  Result Value Ref Range   Prothrombin Time 12.7 11.4 - 15.2 seconds   INR 0.96   APTT     Status: None   Collection Time: 12/24/16 11:33 PM  Result Value Ref Range   aPTT 27 24 - 36 seconds  CBC     Status: None   Collection Time: 12/24/16 11:33 PM  Result Value Ref Range   WBC 8.4 4.0 - 10.5 K/uL   RBC 4.63 4.22 - 5.81 MIL/uL   Hemoglobin 14.2 13.0 - 17.0 g/dL   HCT 40.9 39.0 - 52.0 %   MCV 88.3 78.0 - 100.0 fL   MCH 30.7 26.0 - 34.0 pg   MCHC 34.7 30.0 - 36.0 g/dL   RDW 12.4 11.5 - 15.5 %   Platelets 278 150 - 400 K/uL  Differential     Status: None   Collection Time: 12/24/16 11:33 PM  Result Value Ref Range   Neutrophils Relative % 57 %   Neutro Abs 4.7 1.7 - 7.7 K/uL   Lymphocytes Relative 30 %   Lymphs Abs 2.5 0.7 - 4.0 K/uL   Monocytes Relative 10 %   Monocytes Absolute 0.9 0.1 - 1.0 K/uL   Eosinophils Relative 3 %    Eosinophils Absolute 0.3 0.0 - 0.7 K/uL   Basophils Relative 0 %   Basophils Absolute 0.0 0.0 - 0.1 K/uL  Comprehensive metabolic panel     Status: Abnormal   Collection Time: 12/24/16 11:33 PM  Result Value Ref Range   Sodium 143 135 - 145 mmol/L   Potassium 3.4 (L) 3.5 - 5.1 mmol/L   Chloride 105 101 - 111 mmol/L   CO2 30 22 - 32 mmol/L   Glucose,  Bld 122 (H) 65 - 99 mg/dL   BUN 19 6 - 20 mg/dL   Creatinine, Ser 1.27 (H) 0.61 - 1.24 mg/dL   Calcium 9.4 8.9 - 10.3 mg/dL   Total Protein 7.8 6.5 - 8.1 g/dL   Albumin 4.3 3.5 - 5.0 g/dL   AST 27 15 - 41 U/L   ALT 38 17 - 63 U/L   Alkaline Phosphatase 69 38 - 126 U/L   Total Bilirubin 0.7 0.3 - 1.2 mg/dL   GFR calc non Af Amer >60 >60 mL/min   GFR calc Af Amer >60 >60 mL/min    Comment: (NOTE) The eGFR has been calculated using the CKD EPI equation. This calculation has not been validated in all clinical situations. eGFR's persistently <60 mL/min signify possible Chronic Kidney Disease.    Anion gap 8 5 - 15  I-stat troponin, ED     Status: None   Collection Time: 12/24/16 11:36 PM  Result Value Ref Range   Troponin i, poc 0.00 0.00 - 0.08 ng/mL   Comment 3            Comment: Due to the release kinetics of cTnI, a negative result within the first hours of the onset of symptoms does not rule out myocardial infarction with certainty. If myocardial infarction is still suspected, repeat the test at appropriate intervals.   I-Stat Chem 8, ED     Status: Abnormal   Collection Time: 12/24/16 11:38 PM  Result Value Ref Range   Sodium 144 135 - 145 mmol/L   Potassium 3.7 3.5 - 5.1 mmol/L   Chloride 106 101 - 111 mmol/L   BUN 24 (H) 6 - 20 mg/dL   Creatinine, Ser 1.20 0.61 - 1.24 mg/dL   Glucose, Bld 115 (H) 65 - 99 mg/dL   Calcium, Ion 1.10 (L) 1.15 - 1.40 mmol/L   TCO2 30 22 - 32 mmol/L   Hemoglobin 13.6 13.0 - 17.0 g/dL   HCT 40.0 39.0 - 52.0 %  Urine rapid drug screen (hosp performed)     Status: None   Collection Time:  12/25/16 12:20 AM  Result Value Ref Range   Opiates NONE DETECTED NONE DETECTED   Cocaine NONE DETECTED NONE DETECTED   Benzodiazepines NONE DETECTED NONE DETECTED   Amphetamines NONE DETECTED NONE DETECTED   Tetrahydrocannabinol NONE DETECTED NONE DETECTED   Barbiturates NONE DETECTED NONE DETECTED    Comment:        DRUG SCREEN FOR MEDICAL PURPOSES ONLY.  IF CONFIRMATION IS NEEDED FOR ANY PURPOSE, NOTIFY LAB WITHIN 5 DAYS.        LOWEST DETECTABLE LIMITS FOR URINE DRUG SCREEN Drug Class       Cutoff (ng/mL) Amphetamine      1000 Barbiturate      200 Benzodiazepine   935 Tricyclics       701 Opiates          300 Cocaine          300 THC              50   Urinalysis, Routine w reflex microscopic     Status: Abnormal   Collection Time: 12/25/16 12:20 AM  Result Value Ref Range   Color, Urine YELLOW YELLOW   APPearance CLEAR CLEAR   Specific Gravity, Urine 1.013 1.005 - 1.030   pH 6.0 5.0 - 8.0   Glucose, UA NEGATIVE NEGATIVE mg/dL   Hgb urine dipstick SMALL (A) NEGATIVE   Bilirubin Urine NEGATIVE  NEGATIVE   Ketones, ur NEGATIVE NEGATIVE mg/dL   Protein, ur NEGATIVE NEGATIVE mg/dL   Nitrite NEGATIVE NEGATIVE   Leukocytes, UA NEGATIVE NEGATIVE   RBC / HPF 0-5 0 - 5 RBC/hpf   WBC, UA 0-5 0 - 5 WBC/hpf   Bacteria, UA NONE SEEN NONE SEEN   Squamous Epithelial / LPF 0-5 (A) NONE SEEN   Mucus PRESENT   Lipid panel     Status: Abnormal   Collection Time: 12/25/16  6:35 AM  Result Value Ref Range   Cholesterol 88 0 - 200 mg/dL   Triglycerides 61 <150 mg/dL   HDL 29 (L) >40 mg/dL   Total CHOL/HDL Ratio 3.0 RATIO   VLDL 12 0 - 40 mg/dL   LDL Cholesterol 47 0 - 99 mg/dL    Comment:        Total Cholesterol/HDL:CHD Risk Coronary Heart Disease Risk Table                     Men   Women  1/2 Average Risk   3.4   3.3  Average Risk       5.0   4.4  2 X Average Risk   9.6   7.1  3 X Average Risk  23.4   11.0        Use the calculated Patient Ratio above and the CHD Risk  Table to determine the patient's CHD Risk.        ATP III CLASSIFICATION (LDL):  <100     mg/dL   Optimal  100-129  mg/dL   Near or Above                    Optimal  130-159  mg/dL   Borderline  160-189  mg/dL   High  >190     mg/dL   Very High     Studies/Results:     Eliazar Olivar A. Merlene Laughter, M.D.  Diplomate, Tax adviser of Psychiatry and Neurology ( Neurology). 12/25/2016, 8:36 AM

## 2016-12-26 LAB — HIV ANTIBODY (ROUTINE TESTING W REFLEX): HIV Screen 4th Generation wRfx: NONREACTIVE

## 2016-12-26 LAB — HOMOCYSTEINE: HOMOCYSTEINE-NORM: 12.1 umol/L (ref 0.0–15.0)

## 2016-12-26 LAB — RPR: RPR: NONREACTIVE

## 2017-04-05 ENCOUNTER — Ambulatory Visit (INDEPENDENT_AMBULATORY_CARE_PROVIDER_SITE_OTHER): Payer: Medicare Other | Admitting: Family Medicine

## 2017-04-05 ENCOUNTER — Encounter: Payer: Self-pay | Admitting: Family Medicine

## 2017-04-05 VITALS — BP 118/78 | HR 85 | Resp 16 | Ht 71.0 in | Wt 195.0 lb

## 2017-04-05 DIAGNOSIS — G451 Carotid artery syndrome (hemispheric): Secondary | ICD-10-CM

## 2017-04-05 DIAGNOSIS — I10 Essential (primary) hypertension: Secondary | ICD-10-CM

## 2017-04-05 MED ORDER — HYDROCHLOROTHIAZIDE 12.5 MG PO TABS: 12.5000 mg | ORAL_TABLET | Freq: Every day | ORAL | 3 refills | Status: DC

## 2017-04-05 MED ORDER — LISINOPRIL 40 MG PO TABS: 40.0000 mg | ORAL_TABLET | Freq: Every day | ORAL | 3 refills | Status: DC

## 2017-04-05 MED ORDER — AMLODIPINE BESYLATE 10 MG PO TABS: 10.0000 mg | ORAL_TABLET | Freq: Every day | ORAL | 3 refills | Status: DC

## 2017-04-05 NOTE — Patient Instructions (Signed)
Continue to eat well and exercise Keep blood pressure managed Take the baby aspirin daily Cholesterol is good See me in 3 months Will repeat lab tests at 6 months

## 2017-04-05 NOTE — Progress Notes (Signed)
Chief Complaint  Patient presents with  . Back Pain    lower back pain sometimes radiating into left leg.    Patient is here for routine follow-up. He has hypertension hyperlipidemia and a questionable history of an MI. He was admitted through the emergency room in September for a brief neurologic symptoms.  He was diagnosed with a TIA.  He takes a baby aspirin a day.  He is compliant with his blood pressure medication.  His blood pressure is controlled.  He is compliant with Crestor.  His last LDL was 47.  He gets regular exercise.  He tries to eat a healthy diet.  He is not overweight.  He is here with his new fianc to discuss his health in general.  I told him that he is doing everything he can to prevent stroke.  Because he has had 2 TIAs and a possible MI CAT scan, MRI, but there is no guarantee that he will not have additional problems.  His hemoglobin A1c is 5.5.  He does not smoke. He states while in the hospital he had a period of bradycardia.  He states his heart rate was so low that "4 Drs. rushed into to the room".  He feels that this may have contributed to his TIA.  He was not having any symptoms of bradycardia or hypotension at the time of the TIA.  He has not had any symptoms since then.  I reviewed his test results, CAT scan, MRI, lab results with the patient.  He was instructed to come see me in a month but failed to do so.  Patient Active Problem List   Diagnosis Date Noted  . TIA (transient ischemic attack) 12/25/2016  . Vitamin D deficiency 10/02/2016  . Essential hypertension 08/18/2016  . History of MI (myocardial infarction) 08/18/2016  . Coronary artery disease involving native heart without angina pectoris 08/18/2016  . Chronic low back pain 08/18/2016  . 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 Elevated random blood glucose level 08/18/2016  . Lactose intolerance in adult 08/18/2016    Outpatient Encounter Medications as of 04/05/2017  Medication Sig  .  amLODipine (NORVASC) 10 MG tablet Take 1 tablet (10 mg total) by mouth daily.  Marland Kitchen. aspirin EC 81 MG tablet Take 1 tablet (81 mg total) by mouth daily.  Marland Kitchen. gabapentin (NEURONTIN) 300 MG capsule Take 300 mg by mouth as needed.   . hydrochlorothiazide (HYDRODIURIL) 12.5 MG tablet Take 1 tablet (12.5 mg total) by mouth daily.  Marland Kitchen. lisinopril (PRINIVIL,ZESTRIL) 40 MG tablet Take 1 tablet (40 mg total) by mouth daily.  . rosuvastatin (CRESTOR) 20 MG tablet Take 1 tablet (20 mg total) by mouth daily.   No facility-administered encounter medications on file as of 04/05/2017.     No Known Allergies  Review of Systems  Constitutional: Negative for fatigue and unexpected weight change.  HENT: Negative for congestion and dental problem.   Eyes: Negative for photophobia and visual disturbance.  Respiratory: Negative for shortness of breath and wheezing.   Cardiovascular: Negative for chest pain, palpitations and leg swelling.  Gastrointestinal: Negative for blood in stool, constipation and diarrhea.  Genitourinary: Negative for difficulty urinating and frequency.  Musculoskeletal: Positive for back pain and gait problem.       Chronic back condition  Neurological: Negative for dizziness, numbness and headaches.  Psychiatric/Behavioral: Negative for dysphoric mood. The patient is not nervous/anxious.     BP 118/78   Pulse 85   Resp 16  Ht 5\' 11"  (1.803 m)   Wt 195 lb (88.5 kg)   SpO2 97%   BMI 27.20 kg/m   Physical Exam  Constitutional: He is oriented to person, place, and time. He appears well-developed and well-nourished. No distress.  HENT:  Head: Normocephalic and atraumatic.  Mouth/Throat: Oropharynx is clear and moist.  Eyes: Conjunctivae are normal. Pupils are equal, round, and reactive to light.  Neck: Normal range of motion. Neck supple. No thyromegaly present.  Cardiovascular: Normal rate, regular rhythm and normal heart sounds.  Pulmonary/Chest: Effort normal and breath sounds  normal. No respiratory distress.  Abdominal: Soft. Bowel sounds are normal.  Musculoskeletal: Normal range of motion. He exhibits no edema.  Lymphadenopathy:    He has no cervical adenopathy.  Neurological: He is alert and oriented to person, place, and time.  Normal gait.  No focal neurologic findings  Skin: Skin is warm and dry.  Psychiatric: He has a normal mood and affect. His behavior is normal. Thought content normal.  Nursing note and vitals reviewed.   ASSESSMENT/PLAN:  1. 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  Test results reviewed.  Prevention emphasized.  Greater than 50% of this visit was spent in counseling and coordinating care.  Total face to face time:   30 minutes in discussing his hospitalization, lifestyle, diet, exercise, history of vascular disease and prevention.  Lipids    Patient Instructions  Continue to eat well and exercise Keep blood pressure managed Take the baby aspirin daily Cholesterol is good See me in 3 months Will repeat lab tests at 6 months   Eustace MooreYvonne Sue Aaminah Forrester, MD

## 2017-06-18 ENCOUNTER — Encounter: Payer: Self-pay | Admitting: Family Medicine

## 2017-06-18 ENCOUNTER — Ambulatory Visit (INDEPENDENT_AMBULATORY_CARE_PROVIDER_SITE_OTHER): Payer: Medicare Other | Admitting: Family Medicine

## 2017-06-18 VITALS — BP 134/84 | HR 83 | Temp 98.5°F | Ht 71.0 in | Wt 197.8 lb

## 2017-06-18 DIAGNOSIS — R3129 Other microscopic hematuria: Secondary | ICD-10-CM | POA: Diagnosis not present

## 2017-06-18 DIAGNOSIS — R109 Unspecified abdominal pain: Secondary | ICD-10-CM | POA: Diagnosis not present

## 2017-06-18 LAB — POCT URINALYSIS DIPSTICK
BILIRUBIN UA: NEGATIVE
GLUCOSE UA: NEGATIVE
KETONES UA: NEGATIVE
Leukocytes, UA: NEGATIVE
Nitrite, UA: NEGATIVE
Protein, UA: NEGATIVE
Spec Grav, UA: 1.015 (ref 1.010–1.025)
Urobilinogen, UA: 1 E.U./dL
pH, UA: 6.5 (ref 5.0–8.0)

## 2017-06-18 MED ORDER — HYDROCODONE-ACETAMINOPHEN 7.5-325 MG PO TABS: 1.0000 | ORAL_TABLET | Freq: Four times a day (QID) | ORAL | 0 refills | Status: DC | PRN

## 2017-06-18 MED ORDER — CYCLOBENZAPRINE HCL 5 MG PO TABS: 5.0000 mg | ORAL_TABLET | Freq: Every day | ORAL | 0 refills | Status: DC

## 2017-06-18 NOTE — Patient Instructions (Signed)
Drink plenty of water and fluids Take the pain medicine as needed Rest, moderate your activity May take Flexeril as needed at bedtime.  Muscle relaxer.  See me in 48 hours for follow-up Call or go to the emergency room sooner if you have worsening symptoms

## 2017-06-18 NOTE — Progress Notes (Signed)
Chief Complaint  Patient presents with  . Back Pain    side area, duration 4 days, no urinating issues   Patient is here sooner than his scheduled follow-up appointment.  He has pain in his right mid back/flank area for 4 days.  It is constant.  It is worse with movement.  Decreased appetite but no nausea or vomiting.  No fever or chills.  No urinary difficulty, frequency, hematuria.  This came on for no apparent reason, no activity trauma or fall that would strain his back. He has chronic left-sided low back pain with leg pain from an old back injury and surgery. He does give a history of having hematuria and flank pain many years ago.  The workup failed to reveal a kidney stone although it was suspected.  He states that that pain seems different, came in waves and doubled him over so that he called 911 for emergency room visit.   Patient Active Problem List   Diagnosis Date Noted  . TIA (transient ischemic attack) 12/25/2016  . Vitamin D deficiency 10/02/2016  . Essential hypertension 08/18/2016  . History of MI (myocardial infarction) 08/18/2016  . Coronary artery disease involving native heart without angina pectoris 08/18/2016  . Chronic low back pain 08/18/2016  . 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 Elevated random blood glucose level 08/18/2016  . Lactose intolerance in adult 08/18/2016    Outpatient Encounter Medications as of 06/18/2017  Medication Sig  . amLODipine (NORVASC) 10 MG tablet Take 1 tablet (10 mg total) by mouth daily.  Marland Kitchen. aspirin EC 81 MG tablet Take 1 tablet (81 mg total) by mouth daily.  Marland Kitchen. gabapentin (NEURONTIN) 300 MG capsule Take 300 mg by mouth as needed.   Marland Kitchen. lisinopril (PRINIVIL,ZESTRIL) 40 MG tablet Take 1 tablet (40 mg total) by mouth daily.  . rosuvastatin (CRESTOR) 20 MG tablet Take 1 tablet (20 mg total) by mouth daily.  . cyclobenzaprine (FLEXERIL) 5 MG tablet Take 1 tablet (5 mg total) by mouth at bedtime.  . hydrochlorothiazide  (HYDRODIURIL) 12.5 MG tablet Take 1 tablet (12.5 mg total) by mouth daily. (Patient not taking: Reported on 06/18/2017)  . HYDROcodone-acetaminophen (NORCO) 7.5-325 MG tablet Take 1 tablet by mouth every 6 (six) hours as needed for moderate pain.   No facility-administered encounter medications on file as of 06/18/2017.     No Known Allergies  Review of Systems  Constitutional: Negative for activity change, appetite change, chills and fever.  HENT: Negative for congestion and dental problem.   Eyes: Negative for photophobia and visual disturbance.  Respiratory: Negative for cough and shortness of breath.   Cardiovascular: Negative for chest pain and palpitations.  Gastrointestinal: Negative for abdominal pain, diarrhea, nausea and vomiting.  Genitourinary: Positive for flank pain. Negative for difficulty urinating, frequency and hematuria.  Musculoskeletal: Positive for arthralgias and back pain.  Skin: Negative.   Psychiatric/Behavioral: Positive for sleep disturbance.       Pain is interrupting sleep    BP 134/84 (BP Location: Right Arm, Patient Position: Sitting, Cuff Size: Normal)   Pulse 83   Temp 98.5 F (36.9 C) (Temporal)   Ht 5\' 11"  (1.803 m)   Wt 197 lb 12 oz (89.7 kg)   SpO2 98%   BMI 27.58 kg/m   Physical Exam  Constitutional: He is oriented to person, place, and time. He appears well-developed and well-nourished. He appears distressed.  Appears moderately uncomfortable  HENT:  Head: Normocephalic and atraumatic.  Mouth/Throat: Oropharynx is  clear and moist.  Eyes: Conjunctivae are normal. Pupils are equal, round, and reactive to light.  Neck: Normal range of motion.  Cardiovascular: Normal rate, regular rhythm and normal heart sounds.  Pulmonary/Chest: Effort normal and breath sounds normal.  Abdominal: Soft. There is no tenderness.  No tenderness palpation deep middle abdomen, kidneys  Musculoskeletal: He exhibits no edema.       Back:  Lymphadenopathy:     He has no cervical adenopathy.  Neurological: He is alert and oriented to person, place, and time.  Skin: No rash noted.  No rash overlying painful area  Psychiatric: He has a normal mood and affect. His behavior is normal.    Results for orders placed or performed in visit on 06/18/17  POCT Urinalysis Dipstick  Result Value Ref Range   Color, UA yellow    Clarity, UA clear    Glucose, UA neg    Bilirubin, UA neg    Ketones, UA neg    Spec Grav, UA 1.015 1.010 - 1.025   Blood, UA trace-intact    pH, UA 6.5 5.0 - 8.0   Protein, UA neg    Urobilinogen, UA 1.0 0.2 or 1.0 E.U./dL   Nitrite, UA neg    Leukocytes, UA Negative Negative   Appearance     Odor       ASSESSMENT/PLAN:  1. Flank pain I explained to the patient with flank pain and microscopic hematuria kidney stones were in the differential.  It is possible that he is having a musculoskeletal back pain and microscopic hematuria that is incidental.  He is not having pain that is classic for kidney colic, or any abdominal pain, nausea vomiting, or fever to indicate a worrisome process.  Offered him the choice of going to the emergency room for a kidney stone workup and CAT scan.  An alternate choice is to give him pain management, push fluids, and check him back in 48 hours with another physical examination and urinalysis.  He understands that he needs to go the ER if he starts feeling worse instead of better. - POCT Urinalysis Dipstick  2. Microscopic hematuria    Patient Instructions  Drink plenty of water and fluids Take the pain medicine as needed Rest, moderate your activity May take Flexeril as needed at bedtime.  Muscle relaxer.  See me in 48 hours for follow-up Call or go to the emergency room sooner if you have worsening symptoms    Eustace Moore, MD

## 2017-06-19 ENCOUNTER — Telehealth: Payer: Self-pay | Admitting: Family Medicine

## 2017-06-19 NOTE — Telephone Encounter (Signed)
Please advise if okay to send referral? 

## 2017-06-19 NOTE — Telephone Encounter (Signed)
Noted  

## 2017-06-19 NOTE — Telephone Encounter (Signed)
He has an appointment with  Me tomorrow.  I will attend to it then

## 2017-06-19 NOTE — Telephone Encounter (Signed)
Pt is in front of me, and pain is radiating down both legs from Hip area, He DOES NOT WANT to go to the ER if he doesn't have to. Pt took a Pain pill last night and a Flexaril -and he rested comfortably. He has not taking anything today, trying to get some appointments and he is driving. Pt is wanting a Appt with Dr Merlene Laughter if you can set up a Urgent Referral.

## 2017-06-20 ENCOUNTER — Encounter: Payer: Self-pay | Admitting: Family Medicine

## 2017-06-20 ENCOUNTER — Ambulatory Visit (INDEPENDENT_AMBULATORY_CARE_PROVIDER_SITE_OTHER): Payer: Medicare Other | Admitting: Family Medicine

## 2017-06-20 VITALS — BP 130/84 | HR 74 | Temp 98.8°F | Ht 71.0 in | Wt 195.0 lb

## 2017-06-20 DIAGNOSIS — G8929 Other chronic pain: Secondary | ICD-10-CM | POA: Diagnosis not present

## 2017-06-20 DIAGNOSIS — M545 Low back pain: Secondary | ICD-10-CM | POA: Diagnosis not present

## 2017-06-20 DIAGNOSIS — M5442 Lumbago with sciatica, left side: Secondary | ICD-10-CM

## 2017-06-20 DIAGNOSIS — M5441 Lumbago with sciatica, right side: Secondary | ICD-10-CM

## 2017-06-20 DIAGNOSIS — R109 Unspecified abdominal pain: Secondary | ICD-10-CM | POA: Diagnosis not present

## 2017-06-20 LAB — POCT URINALYSIS DIPSTICK
BILIRUBIN UA: NEGATIVE
Glucose, UA: NEGATIVE
Ketones, UA: NEGATIVE
LEUKOCYTES UA: NEGATIVE
NITRITE UA: NEGATIVE
PH UA: 5.5 (ref 5.0–8.0)
Protein, UA: NEGATIVE
Spec Grav, UA: 1.02 (ref 1.010–1.025)
UROBILINOGEN UA: 0.2 U/dL

## 2017-06-20 MED ORDER — IBUPROFEN 800 MG PO TABS: 800.0000 mg | ORAL_TABLET | Freq: Three times a day (TID) | ORAL | 1 refills | Status: DC | PRN

## 2017-06-20 MED ORDER — GABAPENTIN 300 MG PO CAPS: 300.0000 mg | ORAL_CAPSULE | Freq: Three times a day (TID) | ORAL | 1 refills | Status: DC

## 2017-06-20 NOTE — Progress Notes (Signed)
Chief Complaint  Patient presents with  . Follow-up    referral, has improved since last visit.  Patient is here for follow-up. He was seen 48 hours ago for severe flank pain.  He had microscopic hematuria.  We questioned whether it might be a kidney stone.  He did not want to go for a CAT scan.  Since I thought it was a low probability of significant urologic compromise, I had him push fluids take pain medicine and come back to see me today.  He states his flank pain is nearly resolved.  No hematuria.  No urinary symptoms.  No fever or chills.  No nausea or vomiting. A second problem is his chronic low back pain.  He had back surgery some years ago.  He was left with chronic pain in the low back in the left, and left leg pain.  He presented to the office yesterday and was not seen, but requested a referral for his back.  He wants to see a pain or neurology specialist.  He does not want to see a surgeon.  He does not want additional surgery.  He has had a flare of severe back pain across his back and pain was going down both of his legs with a burning sensation.  This is much worse than his usual.  He did not have any accident or fall, no change in activity.  He does not have any bowel or bladder complaints.  He is interested in additional conservative management of his low back pain and leg pain.  He is not interested in surgery.  He is not interested in chronic narcotic therapy.  He has managed thus far with the minimal use of medications or ibuprofen.   Patient Active Problem List   Diagnosis Date Noted  . TIA (transient ischemic attack) 12/25/2016  . Vitamin D deficiency 10/02/2016  . Essential hypertension 08/18/2016  . History of MI (myocardial infarction) 08/18/2016  . Coronary artery disease involving native heart without angina pectoris 08/18/2016  . Chronic low back pain 08/18/2016  . 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 Elevated random blood glucose level 08/18/2016  .  Lactose intolerance in adult 08/18/2016    Outpatient Encounter Medications as of 06/20/2017  Medication Sig  . amLODipine (NORVASC) 10 MG tablet Take 1 tablet (10 mg total) by mouth daily.  Marland Kitchen aspirin EC 81 MG tablet Take 1 tablet (81 mg total) by mouth daily.  . cyclobenzaprine (FLEXERIL) 5 MG tablet Take 1 tablet (5 mg total) by mouth at bedtime.  . gabapentin (NEURONTIN) 300 MG capsule Take 1 capsule (300 mg total) by mouth 3 (three) times daily.  . hydrochlorothiazide (HYDRODIURIL) 12.5 MG tablet Take 1 tablet (12.5 mg total) by mouth daily.  Marland Kitchen HYDROcodone-acetaminophen (NORCO) 7.5-325 MG tablet Take 1 tablet by mouth every 6 (six) hours as needed for moderate pain.  Marland Kitchen lisinopril (PRINIVIL,ZESTRIL) 40 MG tablet Take 1 tablet (40 mg total) by mouth daily.  . rosuvastatin (CRESTOR) 20 MG tablet Take 1 tablet (20 mg total) by mouth daily.  . [DISCONTINUED] gabapentin (NEURONTIN) 300 MG capsule Take 300 mg by mouth as needed.   Marland Kitchen ibuprofen (ADVIL,MOTRIN) 800 MG tablet Take 1 tablet (800 mg total) by mouth every 8 (eight) hours as needed for moderate pain.   No facility-administered encounter medications on file as of 06/20/2017.     No Known Allergies  Review of Systems  Constitutional: Negative for activity change, appetite change, chills and fever.  HENT: Negative for congestion and dental problem.   Eyes: Negative for photophobia and visual disturbance.  Respiratory: Negative for cough and shortness of breath.   Cardiovascular: Negative for chest pain and palpitations.  Gastrointestinal: Negative for abdominal pain, diarrhea, nausea and vomiting.  Genitourinary: Negative for difficulty urinating, flank pain, frequency and hematuria.  Musculoskeletal: Positive for arthralgias and back pain.  Skin: Negative.   Neurological: Positive for numbness.       Numbness and burning back of both legs  Psychiatric/Behavioral: Positive for sleep disturbance.       Pain is interrupting sleep     BP 130/84 (BP Location: Right Arm, Patient Position: Sitting, Cuff Size: Normal)   Pulse 74   Temp 98.8 F (37.1 C) (Temporal)   Ht 5' 11"  (1.803 m)   Wt 195 lb (88.5 kg)   SpO2 95%   BMI 27.20 kg/m   Physical Exam  Constitutional: He is oriented to person, place, and time. He appears well-developed and well-nourished. He appears distressed.  Appears moderately uncomfortable  HENT:  Head: Normocephalic and atraumatic.  Mouth/Throat: Oropharynx is clear and moist.  Eyes: Conjunctivae are normal. Pupils are equal, round, and reactive to light.  Neck: Normal range of motion.  Cardiovascular: Normal rate, regular rhythm and normal heart sounds.  Pulmonary/Chest: Effort normal and breath sounds normal.  Abdominal: Soft. There is no tenderness.  No tenderness palpation deep middle abdomen, kidneys  Musculoskeletal: He exhibits no edema.  Appears uncomfortable.  Moves slowly.  No tenderness palpation of the low back.  Well-healed lumbar scar.  Lymphadenopathy:    He has no cervical adenopathy.  Neurological: He is alert and oriented to person, place, and time.  Skin: No rash noted.  No rash overlying painful area  Psychiatric: He has a normal mood and affect. His behavior is normal.    ASSESSMENT/PLAN:  1. Flank pain Resolved. - POCT Urinalysis Dipstick  2. Chronic low back pain, unspecified back pain laterality, with sciatica presence unspecified Acute worsening of pain since yesterday with no neurologic symptoms including right leg pain as well as the left leg pain - Ambulatory referral to Neurology  3. Acute left-sided low back pain with bilateral sciatica Discussed conservative management of back pain - Ambulatory referral to Neurology   Patient Instructions  I am refilled the gabapentin to use up to 3 times a day Start with one a day Take 2 or three a day as tolerated  Take the ibuprofen with food Take up to 3 a day  Use flexeril as needed at night  I have  placed referral to Dr Merlene Laughter   Raylene Everts, MD

## 2017-06-20 NOTE — Patient Instructions (Signed)
I am refilled the gabapentin to use up to 3 times a day Start with one a day Take 2 or three a day as tolerated  Take the ibuprofen with food Take up to 3 a day  Use flexeril as needed at night  I have placed referral to Dr Gerilyn Pilgrimoonquah

## 2017-07-04 ENCOUNTER — Other Ambulatory Visit: Payer: Self-pay

## 2017-07-04 ENCOUNTER — Encounter: Payer: Self-pay | Admitting: Family Medicine

## 2017-07-04 ENCOUNTER — Ambulatory Visit (INDEPENDENT_AMBULATORY_CARE_PROVIDER_SITE_OTHER): Payer: Medicare Other | Admitting: Family Medicine

## 2017-07-04 VITALS — BP 138/90 | HR 78 | Temp 98.7°F | Resp 12 | Ht 71.0 in | Wt 195.1 lb

## 2017-07-04 DIAGNOSIS — M5442 Lumbago with sciatica, left side: Secondary | ICD-10-CM | POA: Diagnosis not present

## 2017-07-04 DIAGNOSIS — M5441 Lumbago with sciatica, right side: Secondary | ICD-10-CM | POA: Diagnosis not present

## 2017-07-04 DIAGNOSIS — G8929 Other chronic pain: Secondary | ICD-10-CM

## 2017-07-04 DIAGNOSIS — I10 Essential (primary) hypertension: Secondary | ICD-10-CM | POA: Diagnosis not present

## 2017-07-04 DIAGNOSIS — M545 Low back pain: Secondary | ICD-10-CM | POA: Diagnosis not present

## 2017-07-04 MED ORDER — ROSUVASTATIN CALCIUM 20 MG PO TABS: 20.0000 mg | ORAL_TABLET | Freq: Every day | ORAL | 3 refills | Status: DC

## 2017-07-04 NOTE — Progress Notes (Signed)
Chief Complaint  Patient presents with  . Follow-up    3 month- back pain   Patient is here for routine follow-up. I saw him earlier for an increase in his back pain and leg pain.  I referred him to neurology to be evaluated for the nerve pain.  He has not yet been seen.  He is disappointed that he has not been contacted by the referral office.  We will change his referral and see if a different pain provider can see him sooner.  He has indicated that is not interested in narcotic pain medication, he would like to increase his function and reduce his leg pain by nonsurgical means. His blood pressure is well controlled. He is compliant with Crestor for hyperlipidemia. He states that the Neurontin has not helped with his leg pain.  He uses Flexeril infrequently.  Uses hydrocodone infrequently.  Usually tries to manage with ibuprofen 800 mg. No GI distress or heartburn on the ibuprofen.  Patient Active Problem List   Diagnosis Date Noted  . TIA (transient ischemic attack) 12/25/2016  . Vitamin D deficiency 10/02/2016  . Essential hypertension 08/18/2016  . History of MI (myocardial infarction) 08/18/2016  . Coronary artery disease involving native heart without angina pectoris 08/18/2016  . Chronic low back pain 08/18/2016  . 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 Elevated random blood glucose level 08/18/2016  . Lactose intolerance in adult 08/18/2016    Outpatient Encounter Medications as of 07/04/2017  Medication Sig  . amLODipine (NORVASC) 10 MG tablet Take 1 tablet (10 mg total) by mouth daily.  Marland Kitchen aspirin EC 81 MG tablet Take 1 tablet (81 mg total) by mouth daily.  . cyclobenzaprine (FLEXERIL) 5 MG tablet Take 1 tablet (5 mg total) by mouth at bedtime.  . gabapentin (NEURONTIN) 300 MG capsule Take 1 capsule (300 mg total) by mouth 3 (three) times daily.  . hydrochlorothiazide (HYDRODIURIL) 12.5 MG tablet Take 1 tablet (12.5 mg total) by mouth daily.  Marland Kitchen  HYDROcodone-acetaminophen (NORCO) 7.5-325 MG tablet Take 1 tablet by mouth every 6 (six) hours as needed for moderate pain.  Marland Kitchen ibuprofen (ADVIL,MOTRIN) 800 MG tablet Take 1 tablet (800 mg total) by mouth every 8 (eight) hours as needed for moderate pain.  Marland Kitchen lisinopril (PRINIVIL,ZESTRIL) 40 MG tablet Take 1 tablet (40 mg total) by mouth daily.  . rosuvastatin (CRESTOR) 20 MG tablet Take 1 tablet (20 mg total) by mouth daily.  . [DISCONTINUED] rosuvastatin (CRESTOR) 20 MG tablet Take 1 tablet (20 mg total) by mouth daily.   No facility-administered encounter medications on file as of 07/04/2017.     No Known Allergies  Review of Systems  Constitutional: Negative for activity change, appetite change, chills and fever.  HENT: Negative for congestion and dental problem.   Eyes: Negative for photophobia and visual disturbance.  Respiratory: Negative for cough and shortness of breath.   Cardiovascular: Negative for chest pain and palpitations.  Gastrointestinal: Negative for abdominal pain, diarrhea, nausea and vomiting.  Genitourinary: Negative for difficulty urinating, flank pain, frequency and hematuria.  Musculoskeletal: Positive for arthralgias and back pain.  Skin: Negative.   Neurological: Positive for numbness.       Numbness and burning back of both legs  Psychiatric/Behavioral: Positive for sleep disturbance.       Pain is interrupting sleep    BP 138/90   Pulse 78   Temp 98.7 F (37.1 C)   Resp 12   Ht 5' 11"  (1.803 m)  Wt 195 lb 1.3 oz (88.5 kg)   SpO2 98%   BMI 27.21 kg/m   Physical Exam  Constitutional: He is oriented to person, place, and time. He appears well-developed and well-nourished. He appears distressed.  Appears moderately uncomfortable  HENT:  Head: Normocephalic and atraumatic.  Mouth/Throat: Oropharynx is clear and moist.  Eyes: Pupils are equal, round, and reactive to light. Conjunctivae are normal.  Neck: Normal range of motion.  Cardiovascular:  Normal rate, regular rhythm and normal heart sounds.  Pulmonary/Chest: Effort normal and breath sounds normal.  Abdominal: Soft. There is no tenderness.  Musculoskeletal: He exhibits no edema.  Appears uncomfortable.  Moves slowly.  No tenderness palpation of the low back.  Well-healed lumbar scar.  Lymphadenopathy:    He has no cervical adenopathy.  Neurological: He is alert and oriented to person, place, and time. He displays abnormal reflex.  Antalgic gait  Skin: No rash noted.  No rash overlying painful area  Psychiatric: He has a normal mood and affect. His behavior is normal.    ASSESSMENT/PLAN:  1. Essential hypertension Well-controlled  2. Chronic low back pain, unspecified back pain laterality, with sciatica presence unspecified Not well controlled.  Increase in radicular pain.  Referred to pain/physiatry provider  3. Acute left-sided low back pain with bilateral sciatica As above   Patient Instructions  All of your blood pressure medicines are refilled until Dec Continue to stay as active as you can manage  We are sending you to a different specialist for the pain in your back and leg  Follow up with physician for BP in the next 3-6 months   Raylene Everts, MD

## 2017-07-04 NOTE — Patient Instructions (Signed)
All of your blood pressure medicines are refilled until Dec Continue to stay as active as you can manage  We are sending you to a different specialist for the pain in your back and leg  Follow up with physician for BP in the next 3-6 months

## 2017-07-19 DIAGNOSIS — I10 Essential (primary) hypertension: Secondary | ICD-10-CM | POA: Diagnosis not present

## 2017-07-19 DIAGNOSIS — Z6826 Body mass index (BMI) 26.0-26.9, adult: Secondary | ICD-10-CM | POA: Diagnosis not present

## 2017-07-19 DIAGNOSIS — E782 Mixed hyperlipidemia: Secondary | ICD-10-CM | POA: Diagnosis not present

## 2017-07-19 DIAGNOSIS — G8929 Other chronic pain: Secondary | ICD-10-CM | POA: Diagnosis not present

## 2017-08-12 ENCOUNTER — Emergency Department (HOSPITAL_COMMUNITY)
Admission: EM | Admit: 2017-08-12 | Discharge: 2017-08-12 | Disposition: A | Discharge status: Home / Self Care | Payer: Medicare Other | Attending: Emergency Medicine | Admitting: Emergency Medicine

## 2017-08-12 ENCOUNTER — Encounter (HOSPITAL_COMMUNITY): Payer: Self-pay | Admitting: Emergency Medicine

## 2017-08-12 ENCOUNTER — Emergency Department (HOSPITAL_COMMUNITY): Payer: Medicare Other

## 2017-08-12 DIAGNOSIS — N200 Calculus of kidney: Secondary | ICD-10-CM | POA: Diagnosis not present

## 2017-08-12 DIAGNOSIS — I2510 Atherosclerotic heart disease of native coronary artery without angina pectoris: Secondary | ICD-10-CM | POA: Diagnosis not present

## 2017-08-12 DIAGNOSIS — Z7982 Long term (current) use of aspirin: Secondary | ICD-10-CM | POA: Diagnosis not present

## 2017-08-12 DIAGNOSIS — I10 Essential (primary) hypertension: Secondary | ICD-10-CM | POA: Insufficient documentation

## 2017-08-12 DIAGNOSIS — Z79899 Other long term (current) drug therapy: Secondary | ICD-10-CM | POA: Diagnosis not present

## 2017-08-12 DIAGNOSIS — R109 Unspecified abdominal pain: Secondary | ICD-10-CM | POA: Diagnosis not present

## 2017-08-12 LAB — CBC WITH DIFFERENTIAL/PLATELET
BASOS ABS: 0 10*3/uL (ref 0.0–0.1)
BASOS PCT: 0 %
Eosinophils Absolute: 0.3 10*3/uL (ref 0.0–0.7)
Eosinophils Relative: 4 %
HEMATOCRIT: 38.8 % — AB (ref 39.0–52.0)
HEMOGLOBIN: 13.1 g/dL (ref 13.0–17.0)
LYMPHS PCT: 29 %
Lymphs Abs: 1.8 10*3/uL (ref 0.7–4.0)
MCH: 29.3 pg (ref 26.0–34.0)
MCHC: 33.8 g/dL (ref 30.0–36.0)
MCV: 86.8 fL (ref 78.0–100.0)
MONOS PCT: 11 %
Monocytes Absolute: 0.7 10*3/uL (ref 0.1–1.0)
NEUTROS ABS: 3.3 10*3/uL (ref 1.7–7.7)
NEUTROS PCT: 56 %
Platelets: 278 10*3/uL (ref 150–400)
RBC: 4.47 MIL/uL (ref 4.22–5.81)
RDW: 12.8 % (ref 11.5–15.5)
WBC: 6.1 10*3/uL (ref 4.0–10.5)

## 2017-08-12 LAB — BASIC METABOLIC PANEL
ANION GAP: 5 (ref 5–15)
BUN: 13 mg/dL (ref 6–20)
CHLORIDE: 108 mmol/L (ref 101–111)
CO2: 27 mmol/L (ref 22–32)
CREATININE: 1.15 mg/dL (ref 0.61–1.24)
Calcium: 8.8 mg/dL — ABNORMAL LOW (ref 8.9–10.3)
GFR calc non Af Amer: >60 mL/min (ref >60)
GLUCOSE: 112 mg/dL — AB (ref 65–99)
Potassium: 3.3 mmol/L — ABNORMAL LOW (ref 3.5–5.1)
Sodium: 140 mmol/L (ref 135–145)

## 2017-08-12 LAB — URINALYSIS, ROUTINE W REFLEX MICROSCOPIC
BACTERIA UA: NONE SEEN
Bilirubin Urine: NEGATIVE
Glucose, UA: NEGATIVE mg/dL
Ketones, ur: NEGATIVE mg/dL
Leukocytes, UA: NEGATIVE
Nitrite: NEGATIVE
PH: 6 (ref 5.0–8.0)
PROTEIN: NEGATIVE mg/dL
Specific Gravity, Urine: 1.011 (ref 1.005–1.030)

## 2017-08-12 MED ORDER — ONDANSETRON HCL 4 MG PO TABS: 4.0000 mg | ORAL_TABLET | Freq: Four times a day (QID) | ORAL | 0 refills | Status: DC

## 2017-08-12 MED ORDER — ONDANSETRON HCL 4 MG/2ML IJ SOLN
4.0000 mg | Freq: Once | INTRAMUSCULAR | Status: AC
  Administered 2017-08-12: 4 mg via INTRAVENOUS
  Filled 2017-08-12: qty 2

## 2017-08-12 MED ORDER — HYDROCODONE-ACETAMINOPHEN 5-325 MG PO TABS: 1.0000 | ORAL_TABLET | ORAL | 0 refills | Status: DC | PRN

## 2017-08-12 MED ORDER — KETOROLAC TROMETHAMINE 30 MG/ML IJ SOLN
30.0000 mg | Freq: Once | INTRAMUSCULAR | Status: AC
  Administered 2017-08-12: 30 mg via INTRAVENOUS
  Filled 2017-08-12: qty 1

## 2017-08-12 MED ORDER — SODIUM CHLORIDE 0.9 % IV BOLUS
500.0000 mL | Freq: Once | INTRAVENOUS | Status: AC
  Administered 2017-08-12: 500 mL via INTRAVENOUS

## 2017-08-12 NOTE — Discharge Instructions (Addendum)
You have a very small amount of blood in your urine but no obvious stone in your ureter which is the tube that connects the kidney to the bladder.  You do have a 2 mm in the kidney. Meds for pain and nausea.

## 2017-08-12 NOTE — ED Triage Notes (Signed)
Pt reports right flank pain starting suddenly around 11pm last night.  Denies urinary s/s, n/v.

## 2017-08-12 NOTE — ED Notes (Signed)
Pt resting with eyes closed, appears to be in no distress. Respirations are even and unlabored.  

## 2017-08-13 NOTE — ED Provider Notes (Signed)
[facility] EMERGENCY DEPARTMENT Provider Note   CSN: [id number] Arrival date & time: 08/12/17  4098     History   Chief Complaint Chief Complaint  Patient presents with  . Flank Pain    HPI Shannon Wilson is a 47 y.o. male.  Right flank pain at approximately 11 PM last night.  No dysuria, hematuria, fever, sweats, chills.  Past medical history includes a questionable kidney stone.  Severity of pain is mild.  Nothing makes symptoms better or worse.     Past Medical History:  Diagnosis Date  . Back pain   . GERD (gastroesophageal reflux disease)   . Hypertension   . Myocardial infarction (H[facility])   . Renal disorder    kidney stones    Patient Active Problem List   Diagnosis Date Noted  . TIA (transient ischemic attack) 12/25/2016  . Vitamin D deficiency 10/02/2016  . Essential hypertension 08/18/2016  . History of MI (myocardial infarction) 08/18/2016  . Coronary artery disease involving native heart without angina pectoris 08/18/2016  . Chronic low back pain 08/18/2016  . 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 Elevated random blood glucose level 08/18/2016  . Lactose intolerance in adult 08/18/2016    Past Surgical History:  Procedure Laterality Date  . BACK SURGERY  08/2012  . SPINE SURGERY  08/2012   discectomy        Home Medications    Prior to Admission medications   Medication Sig Start Date End Date Taking? Authorizing Provider  amLODipine (NORVASC) 10 MG tablet Take 1 tablet (10 mg total) by mouth daily. 04/05/17  Yes Eustace Moore, MD  aspirin EC 81 MG tablet Take 1 tablet (81 mg total) by mouth daily. 12/25/16  Yes Hollice Espy, MD  cyclobenzaprine (FLEXERIL) 5 MG tablet Take 1 tablet (5 mg total) by mouth at bedtime. 06/18/17  Yes Eustace Moore, MD  gabapentin (NEURONTIN) 300 MG capsule Take 1 capsule (300 mg total) by mouth 3 (three) times daily. 06/20/17  Yes Eustace Moore, MD  hydrochlorothiazide (HYDRODIURIL) 12.5 MG  tablet Take 1 tablet (12.5 mg total) by mouth daily. 04/05/17  Yes Eustace Moore, MD  ibuprofen (ADVIL,MOTRIN) 800 MG tablet Take 1 tablet (800 mg total) by mouth every 8 (eight) hours as needed for moderate pain. 06/20/17  Yes Eustace Moore, MD  lisinopril (PRINIVIL,ZESTRIL) 40 MG tablet Take 1 tablet (40 mg total) by mouth daily. 04/05/17  Yes Eustace Moore, MD  HYDROcodone-acetaminophen (NORCO/VICODIN) 5-325 MG tablet Take 1 tablet by mouth every 4 (four) hours as needed. 08/12/17   Donnetta Hutching, MD  ondansetron (ZOFRAN) 4 MG tablet Take 1 tablet (4 mg total) by mouth every 6 (six) hours. 08/12/17   Donnetta Hutching, MD    Family History Family History  Problem Relation Age of Onset  . Hypertension Mother   . Heart disease Mother 22  . Cancer Father        prostate  . Sickle cell trait Father   . Sickle cell trait Son   . Cancer Maternal Grandmother        bone  . Cancer Paternal Grandmother   . Cancer Paternal Grandfather        liver  . Learning disabilities Neg Hx     Social History Social History   Tobacco Use  . Smoking status: Never Smoker  . Smokeless tobacco: Never Used  Substance Use Topics  . Alcohol use: No  . Drug use: No  Allergies   Patient has no known allergies.   Review of Systems Review of Systems  All other systems reviewed and are negative.    Physical Exam Updated Vital Signs BP (!) 164/92   Pulse (!) 55   Temp 98.4 F (36.9 C) (Oral)   Resp 18   Ht  (1.803 m)   Wt 88 kg (194 lb)   SpO2 97%   BMI 27.06 kg/m   Physical Exam  Constitutional: He is oriented to person, place, and time. He appears well-developed and well-nourished.  HENT:  Head: Normocephalic and atraumatic.  Eyes: Conjunctivae are normal.  Neck: Neck supple.  Cardiovascular: Normal rate and regular rhythm.  Pulmonary/Chest: Effort normal and breath sounds normal.  Abdominal: Soft. Bowel sounds are normal.  Genitourinary:  Genitourinary Comments:  Minimal right flank tenderness.  Musculoskeletal: Normal range of motion.  Neurological: He is alert and oriented to person, place, and time.  Skin: Skin is warm and dry.  Psychiatric: He has a normal mood and affect. His behavior is normal.  Nursing note and vitals reviewed.    ED Treatments / Results  Labs (all labs ordered are listed, but only abnormal results are displayed) Labs Reviewed  URINALYSIS, ROUTINE W REFLEX MICROSCOPIC - Abnormal; Notable for the following components:      Result Value   Hgb urine dipstick SMALL (*)    All other components within normal limits  CBC WITH DIFFERENTIAL/PLATELET - Abnormal; Notable for the following components:   HCT 38.8 (*)    All other components within normal limits  BASIC METABOLIC PANEL - Abnormal; Notable for the following components:   Potassium 3.3 (*)    Glucose, Bld 112 (*)    Calcium 8.8 (*)    All other components within normal limits    EKG None  Radiology Ct Renal Stone Study  Result Date: 08/12/2017 CLINICAL DATA:  47 year old male complaining of right-sided flank pain since 11 p.m. yesterday evening. History of kidney stones. EXAM: CT ABDOMEN AND PELVIS WITHOUT CONTRAST TECHNIQUE: Multidetector CT imaging of the abdomen and pelvis was performed following the standard protocol without IV contrast. COMPARISON:  No priors. FINDINGS: Lower chest: Unremarkable. Hepatobiliary: No definite suspicious cystic or solid hepatic lesions are confidently identified on today's noncontrast CT examination. Unenhanced appearance of the gallbladder is normal. Pancreas: No pancreatic mass or peripancreatic fluid or inflammatory changes are confidently identified on today's noncontrast CT examination. Spleen: Unremarkable. Adrenals/Urinary Tract: 2 mm nonobstructive calculus in the lower pole collecting system of the right kidney. No additional calculi are noted within the collecting system of the left kidney, along the course of either ureter,  or within the lumen of the urinary bladder. No hydroureteronephrosis or perinephric stranding to suggest urinary tract obstruction at this time. Unenhanced appearance of the kidneys, bilateral adrenal glands and urinary bladder are otherwise unremarkable. Stomach/Bowel: Unenhanced appearance of the stomach is normal. No pathologic dilatation of small bowel or colon. Normal appendix. Vascular/Lymphatic: No atherosclerotic calcifications are noted in the abdominal or pelvic vasculature. No lymphadenopathy noted in the abdomen or pelvis. Reproductive: Prostate gland and seminal vesicles are unremarkable in appearance. Other: No significant volume of ascites.  No pneumoperitoneum. Musculoskeletal: There are no aggressive appearing lytic or blastic lesions noted in the visualized portions of the skeleton. IMPRESSION: 1. 2 mm nonobstructive calculus in the lower pole collecting system of the right kidney. No ureteral stones or findings of urinary tract obstruction are noted at this time. 2. Normal appendix. Electronically Signed  By: Trudie Reed M.D.   On: 08/12/2017 09:33    Procedures Procedures (including critical care time)  Medications Ordered in ED Medications  sodium chloride 0.9 % bolus 500 mL (0 mLs Intravenous Stopped 08/12/17 0844)  ondansetron (ZOFRAN) injection 4 mg (4 mg Intravenous Given 08/12/17 0816)  ketorolac (TORADOL) 30 MG/ML injection 30 mg (30 mg Intravenous Given 08/12/17 0816)     Initial Impression / Assessment and Plan / ED Course  I have reviewed the triage vital signs and the nursing notes.  Pertinent labs & imaging results that were available during my care of the patient were reviewed by me and considered in my medical decision making (see chart for details).     Patient presents with right flank pain.  He is hemodynamically stable.  Urinalysis shows a small amount hemoglobin.  Renal CT reveals a 2 mm stone in the lower pole collecting system of the right kidney.  He may  have broken off a small piece of stone earlier. No acute abdomen at discharge.  Discharge medications Vicodin and Zofran 4 mg  Final Clinical Impressions(s) / ED Diagnoses   Final diagnoses:  Right flank pain    ED Discharge Orders        Ordered    HYDROcodone-acetaminophen (NORCO/VICODIN) 5-325 MG tablet  Every 4 hours PRN     08/12/17 1203    ondansetron (ZOFRAN) 4 MG tablet  Every 6 hours     08/12/17 1203       Donnetta Hutching, MD 08/13/17 1022

## 2017-08-17 DIAGNOSIS — I10 Essential (primary) hypertension: Secondary | ICD-10-CM | POA: Diagnosis not present

## 2017-08-17 DIAGNOSIS — E782 Mixed hyperlipidemia: Secondary | ICD-10-CM | POA: Diagnosis not present

## 2017-08-23 DIAGNOSIS — Z0000 Encounter for general adult medical examination without abnormal findings: Secondary | ICD-10-CM | POA: Diagnosis not present

## 2017-08-23 DIAGNOSIS — E782 Mixed hyperlipidemia: Secondary | ICD-10-CM | POA: Diagnosis not present

## 2017-08-23 DIAGNOSIS — I10 Essential (primary) hypertension: Secondary | ICD-10-CM | POA: Diagnosis not present

## 2017-08-23 DIAGNOSIS — Z6826 Body mass index (BMI) 26.0-26.9, adult: Secondary | ICD-10-CM | POA: Diagnosis not present

## 2017-08-23 DIAGNOSIS — G8929 Other chronic pain: Secondary | ICD-10-CM | POA: Diagnosis not present

## 2017-09-04 ENCOUNTER — Ambulatory Visit (INDEPENDENT_AMBULATORY_CARE_PROVIDER_SITE_OTHER): Payer: Medicare Other | Admitting: Neurology

## 2017-09-04 ENCOUNTER — Encounter: Payer: Self-pay | Admitting: Neurology

## 2017-09-04 ENCOUNTER — Other Ambulatory Visit: Payer: Self-pay

## 2017-09-04 VITALS — BP 147/95 | HR 80 | Resp 16 | Ht 71.0 in | Wt 193.5 lb

## 2017-09-04 DIAGNOSIS — M545 Low back pain: Secondary | ICD-10-CM | POA: Diagnosis not present

## 2017-09-04 DIAGNOSIS — G96198 Other disorders of meninges, not elsewhere classified: Secondary | ICD-10-CM

## 2017-09-04 DIAGNOSIS — M5417 Radiculopathy, lumbosacral region: Secondary | ICD-10-CM | POA: Insufficient documentation

## 2017-09-04 DIAGNOSIS — G8929 Other chronic pain: Secondary | ICD-10-CM

## 2017-09-04 DIAGNOSIS — G9619 Other disorders of meninges, not elsewhere classified: Secondary | ICD-10-CM | POA: Diagnosis not present

## 2017-09-04 HISTORY — DX: Radiculopathy, lumbosacral region: M54.17

## 2017-09-04 MED ORDER — GABAPENTIN 600 MG PO TABS: 600.0000 mg | ORAL_TABLET | Freq: Three times a day (TID) | ORAL | 11 refills | Status: DC
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
## 2017-11-28 DIAGNOSIS — E782 Mixed hyperlipidemia: Secondary | ICD-10-CM | POA: Diagnosis not present

## 2017-11-28 DIAGNOSIS — I10 Essential (primary) hypertension: Secondary | ICD-10-CM | POA: Diagnosis not present

## 2017-11-29 DIAGNOSIS — M5410 Radiculopathy, site unspecified: Secondary | ICD-10-CM | POA: Diagnosis not present

## 2017-11-29 DIAGNOSIS — E782 Mixed hyperlipidemia: Secondary | ICD-10-CM | POA: Diagnosis not present

## 2017-11-29 DIAGNOSIS — G8929 Other chronic pain: Secondary | ICD-10-CM | POA: Diagnosis not present

## 2017-11-29 DIAGNOSIS — M545 Low back pain: Secondary | ICD-10-CM | POA: Diagnosis not present

## 2017-11-29 DIAGNOSIS — I10 Essential (primary) hypertension: Secondary | ICD-10-CM | POA: Diagnosis not present

## 2017-11-29 DIAGNOSIS — Z6825 Body mass index (BMI) 25.0-25.9, adult: Secondary | ICD-10-CM | POA: Diagnosis not present

## 2017-12-14 ENCOUNTER — Other Ambulatory Visit: Payer: Self-pay

## 2017-12-14 ENCOUNTER — Encounter (HOSPITAL_COMMUNITY): Payer: Self-pay | Admitting: Emergency Medicine

## 2017-12-14 ENCOUNTER — Emergency Department (HOSPITAL_COMMUNITY)
Admission: EM | Admit: 2017-12-14 | Discharge: 2017-12-14 | Disposition: A | Discharge status: Home / Self Care | Payer: Medicare Other | Attending: Emergency Medicine | Admitting: Emergency Medicine

## 2017-12-14 DIAGNOSIS — Z79899 Other long term (current) drug therapy: Secondary | ICD-10-CM | POA: Insufficient documentation

## 2017-12-14 DIAGNOSIS — I10 Essential (primary) hypertension: Secondary | ICD-10-CM | POA: Insufficient documentation

## 2017-12-14 DIAGNOSIS — M545 Low back pain: Secondary | ICD-10-CM | POA: Diagnosis present

## 2017-12-14 DIAGNOSIS — Z7982 Long term (current) use of aspirin: Secondary | ICD-10-CM | POA: Diagnosis not present

## 2017-12-14 DIAGNOSIS — Z8673 Personal history of transient ischemic attack (TIA), and cerebral infarction without residual deficits: Secondary | ICD-10-CM | POA: Insufficient documentation

## 2017-12-14 DIAGNOSIS — M5442 Lumbago with sciatica, left side: Secondary | ICD-10-CM | POA: Diagnosis not present

## 2017-12-14 DIAGNOSIS — M5432 Sciatica, left side: Secondary | ICD-10-CM | POA: Diagnosis not present

## 2017-12-14 MED ORDER — PREDNISONE 50 MG PO TABS
60.0000 mg | ORAL_TABLET | Freq: Once | ORAL | Status: AC
  Administered 2017-12-14: 18:00:00 60 mg via ORAL
  Filled 2017-12-14: qty 1

## 2017-12-14 MED ORDER — METAXALONE 800 MG PO TABS: 800.0000 mg | ORAL_TABLET | Freq: Three times a day (TID) | ORAL | 0 refills | Status: DC

## 2017-12-14 MED ORDER — PREDNISONE 10 MG PO TABS: ORAL_TABLET | ORAL | 0 refills | Status: DC

## 2017-12-14 MED ORDER — HYDROMORPHONE HCL 1 MG/ML IJ SOLN
1.0000 mg | Freq: Once | INTRAMUSCULAR | Status: AC
  Administered 2017-12-14: 1 mg via INTRAMUSCULAR
  Filled 2017-12-14: qty 1

## 2017-12-14 NOTE — ED Notes (Signed)
Pt complains of chronic back pain  Denies bowel or bladder incontinence  Ambulates heel to toe erect without stagger, drift, or change in facial expression

## 2017-12-14 NOTE — Discharge Instructions (Addendum)
Take your next dose of prednisone tomorrow evening.   Avoid lifting,  Bending,  Twisting or any other activity that worsens your pain over the next week.  You may alternate ice and heat to your lower back as discussed.  You should get rechecked if your symptoms are not better over the next 5 days,  Or you develop increased pain,  Weakness in your leg(s) or loss of bladder or bowel function - these are symptoms of a worsening condition.

## 2017-12-14 NOTE — ED Notes (Addendum)
Pt is driving has friend at bedside  Advised not to drive as he has had strong narcotic pain meds'  Friend advises she will drive home
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
## 2017-12-14 NOTE — ED Notes (Signed)
Pt reports he is followed by Dr Margo Aye  Has not seen Also followed "for pain management" by a doctor in [place] but has an appt next month

## 2017-12-14 NOTE — ED Triage Notes (Signed)
Pt comes in for chronic back pain radiating down his left leg. Denies bowel/bladder incontinence. Has taken Motrin with no relief.

## 2017-12-20 ENCOUNTER — Other Ambulatory Visit: Payer: Self-pay

## 2017-12-20 ENCOUNTER — Encounter: Payer: Self-pay | Admitting: Neurology

## 2017-12-20 ENCOUNTER — Telehealth: Payer: Self-pay | Admitting: Neurology

## 2017-12-20 ENCOUNTER — Ambulatory Visit (INDEPENDENT_AMBULATORY_CARE_PROVIDER_SITE_OTHER): Payer: Medicare Other | Admitting: Neurology

## 2017-12-20 VITALS — BP 133/88 | HR 74 | Resp 14 | Ht 71.0 in | Wt 191.5 lb

## 2017-12-20 DIAGNOSIS — M5417 Radiculopathy, lumbosacral region: Secondary | ICD-10-CM | POA: Diagnosis not present

## 2017-12-20 DIAGNOSIS — M545 Low back pain: Secondary | ICD-10-CM

## 2017-12-20 DIAGNOSIS — G8929 Other chronic pain: Secondary | ICD-10-CM | POA: Diagnosis not present

## 2017-12-20 DIAGNOSIS — G9619 Other disorders of meninges, not elsewhere classified: Secondary | ICD-10-CM

## 2017-12-20 DIAGNOSIS — G96198 Other disorders of meninges, not elsewhere classified: Secondary | ICD-10-CM

## 2017-12-20 MED ORDER — PREDNISONE 10 MG (21) PO TBPK: ORAL_TABLET | ORAL | 0 refills | Status: DC
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
## 2017-12-20 NOTE — Telephone Encounter (Signed)
Medicare order sent to GI. No auth they will reach out to the pt to schedule.

## 2017-12-28 ENCOUNTER — Other Ambulatory Visit: Payer: Self-pay | Admitting: Neurology

## 2017-12-31 ENCOUNTER — Ambulatory Visit
Admission: RE | Admit: 2017-12-31 | Discharge: 2017-12-31 | Disposition: A | Discharge status: Home / Self Care | Payer: Medicare Other | Source: Ambulatory Visit | Attending: Neurology | Admitting: Neurology

## 2017-12-31 DIAGNOSIS — G9619 Other disorders of meninges, not elsewhere classified: Secondary | ICD-10-CM

## 2017-12-31 DIAGNOSIS — M545 Low back pain: Secondary | ICD-10-CM | POA: Diagnosis not present

## 2017-12-31 DIAGNOSIS — G8929 Other chronic pain: Secondary | ICD-10-CM

## 2017-12-31 DIAGNOSIS — G96198 Other disorders of meninges, not elsewhere classified: Secondary | ICD-10-CM

## 2017-12-31 DIAGNOSIS — M5417 Radiculopathy, lumbosacral region: Secondary | ICD-10-CM | POA: Diagnosis not present

## 2017-12-31 MED ORDER — GADOBENATE DIMEGLUMINE 529 MG/ML IV SOLN
17.0000 mL | Freq: Once | INTRAVENOUS | Status: AC | PRN
  Administered 2017-12-31: 17 mL via INTRAVENOUS

## 2018-01-04 ENCOUNTER — Telehealth: Payer: Self-pay | Admitting: *Deleted

## 2018-01-04 DIAGNOSIS — M5417 Radiculopathy, lumbosacral region: Secondary | ICD-10-CM

## 2018-01-04 DIAGNOSIS — G8929 Other chronic pain: Secondary | ICD-10-CM

## 2018-01-04 DIAGNOSIS — G96198 Other disorders of meninges, not elsewhere classified: Secondary | ICD-10-CM

## 2018-01-04 DIAGNOSIS — G9619 Other disorders of meninges, not elsewhere classified: Secondary | ICD-10-CM

## 2018-01-04 DIAGNOSIS — M5442 Lumbago with sciatica, left side: Secondary | ICD-10-CM

## 2018-01-04 NOTE — Telephone Encounter (Signed)
Spoke with pt. and reviewed below MRI results . He verbalized understanding of same, sts. he has had approx 10 esi's in the past that have never provided more than 2-3 days of pain relief.  He would like to go ahead and schedule NS consult.  Order for NS referral placed in Epic/fim

## 2018-01-04 NOTE — Telephone Encounter (Signed)
-----   Message from Asa Lente, MD sent at 12/31/2017  7:17 PM EDT ----- Please let him know he has scar tissue around the left L5 nerve root.   Please schedule ESI for left L5 radiculopathy and epidural fibrosis.  If this does not help, we will refer to neurosurgery

## 2018-01-09 NOTE — Telephone Encounter (Signed)
[facility] Has called and spoke to patient , I have also talked Patient and he is aware . Patient   Is aware of all details and he is going to fax me records to [phone].

## 2018-02-26 DIAGNOSIS — J329 Chronic sinusitis, unspecified: Secondary | ICD-10-CM | POA: Diagnosis not present

## 2018-02-26 DIAGNOSIS — J069 Acute upper respiratory infection, unspecified: Secondary | ICD-10-CM | POA: Diagnosis not present

## 2018-03-28 ENCOUNTER — Emergency Department (HOSPITAL_COMMUNITY)
Admission: EM | Admit: 2018-03-28 | Discharge: 2018-03-28 | Disposition: A | Discharge status: Home / Self Care | Payer: Medicare Other | Attending: Emergency Medicine | Admitting: Emergency Medicine

## 2018-03-28 ENCOUNTER — Emergency Department (HOSPITAL_COMMUNITY): Payer: Medicare Other

## 2018-03-28 ENCOUNTER — Encounter (HOSPITAL_COMMUNITY): Payer: Self-pay | Admitting: *Deleted

## 2018-03-28 DIAGNOSIS — G43109 Migraine with aura, not intractable, without status migrainosus: Secondary | ICD-10-CM | POA: Diagnosis not present

## 2018-03-28 DIAGNOSIS — I10 Essential (primary) hypertension: Secondary | ICD-10-CM | POA: Insufficient documentation

## 2018-03-28 DIAGNOSIS — Z79899 Other long term (current) drug therapy: Secondary | ICD-10-CM | POA: Diagnosis not present

## 2018-03-28 DIAGNOSIS — I252 Old myocardial infarction: Secondary | ICD-10-CM | POA: Diagnosis not present

## 2018-03-28 DIAGNOSIS — I2510 Atherosclerotic heart disease of native coronary artery without angina pectoris: Secondary | ICD-10-CM | POA: Diagnosis not present

## 2018-03-28 DIAGNOSIS — Z7982 Long term (current) use of aspirin: Secondary | ICD-10-CM | POA: Diagnosis not present

## 2018-03-28 DIAGNOSIS — Z8673 Personal history of transient ischemic attack (TIA), and cerebral infarction without residual deficits: Secondary | ICD-10-CM | POA: Insufficient documentation

## 2018-03-28 DIAGNOSIS — R42 Dizziness and giddiness: Secondary | ICD-10-CM | POA: Insufficient documentation

## 2018-03-28 DIAGNOSIS — R200 Anesthesia of skin: Secondary | ICD-10-CM | POA: Diagnosis not present

## 2018-03-28 DIAGNOSIS — G40909 Epilepsy, unspecified, not intractable, without status epilepticus: Secondary | ICD-10-CM | POA: Diagnosis not present

## 2018-03-28 DIAGNOSIS — R51 Headache: Secondary | ICD-10-CM | POA: Diagnosis not present

## 2018-03-28 HISTORY — DX: Migraine, unspecified, not intractable, without status migrainosus: G43.909

## 2018-03-28 HISTORY — DX: Arteritis, unspecified: I77.6

## 2018-03-28 HISTORY — DX: Hyperlipidemia, unspecified: E78.5

## 2018-03-28 HISTORY — DX: Transient cerebral ischemic attack, unspecified: G45.9

## 2018-03-28 HISTORY — DX: Calculus of kidney: N20.0

## 2018-03-28 LAB — RAPID URINE DRUG SCREEN, HOSP PERFORMED
Amphetamines: NOT DETECTED
BARBITURATES: NOT DETECTED
Benzodiazepines: NOT DETECTED
COCAINE: NOT DETECTED
Opiates: NOT DETECTED
Tetrahydrocannabinol: NOT DETECTED

## 2018-03-28 LAB — I-STAT TROPONIN, ED: Troponin i, poc: 0 ng/mL (ref 0.00–0.08)

## 2018-03-28 LAB — CBC
HCT: 43 % (ref 39.0–52.0)
Hemoglobin: 14.3 g/dL (ref 13.0–17.0)
MCH: 29.4 pg (ref 26.0–34.0)
MCHC: 33.3 g/dL (ref 30.0–36.0)
MCV: 88.3 fL (ref 80.0–100.0)
PLATELETS: 319 10*3/uL (ref 150–400)
RBC: 4.87 MIL/uL (ref 4.22–5.81)
RDW: 12.7 % (ref 11.5–15.5)
WBC: 7.6 10*3/uL (ref 4.0–10.5)
nRBC: 0 % (ref 0.0–0.2)

## 2018-03-28 LAB — COMPREHENSIVE METABOLIC PANEL
ALT: 43 U/L (ref 0–44)
AST: 29 U/L (ref 15–41)
Albumin: 4.1 g/dL (ref 3.5–5.0)
Alkaline Phosphatase: 88 U/L (ref 38–126)
Anion gap: 6 (ref 5–15)
BUN: 20 mg/dL (ref 6–20)
CO2: 29 mmol/L (ref 22–32)
Calcium: 9.5 mg/dL (ref 8.9–10.3)
Chloride: 105 mmol/L (ref 98–111)
Creatinine, Ser: 1.25 mg/dL — ABNORMAL HIGH (ref 0.61–1.24)
GFR calc Af Amer: >60 mL/min (ref >60)
GFR calc non Af Amer: >60 mL/min (ref >60)
Glucose, Bld: 111 mg/dL — ABNORMAL HIGH (ref 70–99)
Potassium: 4.2 mmol/L (ref 3.5–5.1)
Sodium: 140 mmol/L (ref 135–145)
Total Bilirubin: 0.9 mg/dL (ref 0.3–1.2)
Total Protein: 7.6 g/dL (ref 6.5–8.1)

## 2018-03-28 LAB — URINALYSIS, ROUTINE W REFLEX MICROSCOPIC
Bacteria, UA: NONE SEEN
Bilirubin Urine: NEGATIVE
Glucose, UA: NEGATIVE mg/dL
Ketones, ur: NEGATIVE mg/dL
Leukocytes, UA: NEGATIVE
Nitrite: NEGATIVE
Protein, ur: NEGATIVE mg/dL
Specific Gravity, Urine: 1.012 (ref 1.005–1.030)
pH: 6 (ref 5.0–8.0)

## 2018-03-28 LAB — DIFFERENTIAL
ABS IMMATURE GRANULOCYTES: 0.01 10*3/uL (ref 0.00–0.07)
Basophils Absolute: 0 10*3/uL (ref 0.0–0.1)
Basophils Relative: 1 %
Eosinophils Absolute: 0.4 10*3/uL (ref 0.0–0.5)
Eosinophils Relative: 5 %
Immature Granulocytes: 0 %
Lymphocytes Relative: 29 %
Lymphs Abs: 2.2 10*3/uL (ref 0.7–4.0)
Monocytes Absolute: 0.6 10*3/uL (ref 0.1–1.0)
Monocytes Relative: 8 %
Neutro Abs: 4.4 10*3/uL (ref 1.7–7.7)
Neutrophils Relative %: 57 %

## 2018-03-28 LAB — APTT: aPTT: 27 seconds (ref 24–36)

## 2018-03-28 LAB — CBG MONITORING, ED: Glucose-Capillary: 117 mg/dL — ABNORMAL HIGH (ref 70–99)

## 2018-03-28 LAB — I-STAT CHEM 8, ED
BUN: 19 mg/dL (ref 6–20)
Calcium, Ion: 1.28 mmol/L (ref 1.15–1.40)
Chloride: 102 mmol/L (ref 98–111)
Creatinine, Ser: 1.3 mg/dL — ABNORMAL HIGH (ref 0.61–1.24)
Glucose, Bld: 111 mg/dL — ABNORMAL HIGH (ref 70–99)
HEMATOCRIT: 43 % (ref 39.0–52.0)
Hemoglobin: 14.6 g/dL (ref 13.0–17.0)
Potassium: 3.8 mmol/L (ref 3.5–5.1)
Sodium: 142 mmol/L (ref 135–145)
TCO2: 31 mmol/L (ref 22–32)

## 2018-03-28 LAB — PROTIME-INR
INR: 1.01
Prothrombin Time: 13.2 seconds (ref 11.4–15.2)

## 2018-03-28 LAB — ETHANOL: Alcohol, Ethyl (B): <10 mg/dL (ref <10)

## 2018-03-28 MED ORDER — DIPHENHYDRAMINE HCL 50 MG/ML IJ SOLN
50.0000 mg | Freq: Once | INTRAMUSCULAR | Status: AC
  Administered 2018-03-28: 50 mg via INTRAVENOUS
  Filled 2018-03-28: qty 1

## 2018-03-28 MED ORDER — KETOROLAC TROMETHAMINE 30 MG/ML IJ SOLN
30.0000 mg | Freq: Once | INTRAMUSCULAR | Status: AC
  Administered 2018-03-28: 30 mg via INTRAVENOUS
  Filled 2018-03-28: qty 1

## 2018-03-28 MED ORDER — METOCLOPRAMIDE HCL 10 MG PO TABS: 10.0000 mg | ORAL_TABLET | Freq: Four times a day (QID) | ORAL | 0 refills | Status: DC | PRN

## 2018-03-28 MED ORDER — ACETAMINOPHEN 325 MG PO TABS
650.0000 mg | ORAL_TABLET | Freq: Once | ORAL | Status: AC
  Administered 2018-03-28: 650 mg via ORAL
  Filled 2018-03-28: qty 2

## 2018-03-28 MED ORDER — METOCLOPRAMIDE HCL 5 MG/ML IJ SOLN
10.0000 mg | Freq: Once | INTRAMUSCULAR | Status: AC
  Administered 2018-03-28: 10 mg via INTRAVENOUS
  Filled 2018-03-28: qty 2

## 2018-03-28 NOTE — ED Notes (Signed)
Advised patient not to drive after discharge due to medication administration. Patient verbalized understanding. Discharged with mother to drive him home.

## 2018-03-28 NOTE — Consult Note (Signed)
[facility] Tele[facility]urology Consult Services   Date of Service:   03/28/2018 12:24:03  Impression:     .  complicated migraine  Comments: the patient has a history of complicated migraine and had symptoms of numbness like this before. Also the history with the numbness is very reassuring spreading from the right to the left face. can treat his headache with normal migraine medications and if he feels better he can be discharged home with follow-up with his outpatient neurologist or primary care provider routinely.  Metrics: Last Known Well: 03/28/2018 06:30:00 [facility] Notification Time: 03/28/2018 12:24:03 Arrival Time: 03/28/2018 10:53:00 Stamp Time: 03/28/2018 12:24:03 First login attempt : 12:27:00 Video Start Time: 03/28/2018 12:34:00  Symptoms: HA NIHSS Start Assessment Time: 03/28/2018 12:34:00 Patient is not a candidate for tPA. Patient was not deemed candidate for tPA thrombolytics because of exam not c/w stroke. Video End Time: 03/28/2018 12:42:06  CT head showed no acute hemorrhage or acute core infarct.  Advanced imaging was not obtained as the presentation was not suggestive of Large Vessel Occlusive Disease.   ER Physician notified of the decision on thrombolytics management on 03/28/2018 12:42:17  Our recommendations are outlined below.  Recommendations:     .  Activate Stroke Protocol Admission/Order Set     .  Stroke/Telemetry Floor     .  Neuro Checks     .  Bedside Swallow Eval     .  DVT Prophylaxis     .  IV Fluids, Normal Saline     .  Head of Bed Below 30 Degrees     .  Euglycemia and Avoid Hyperthermia (PRN Acetaminophen)     .  He may continue home asa   Sign Out:     .  Discussed with Emergency Department Provider    ------------------------------------------------------------------------------  History of Present Illness: Patient is a 47 year old Male.  Patient was brought by private transportation with symptoms of  HA  Has a hx of MI, HA complicated migraine. At 06:30 woke up with dizziness and HA lightheaded some nausea. Takes 81 mg daily. He came in for the headache then had right cheek numbness that has resolved then came back then went to the went back and forth. He gets them occasionally and they feel like this has had this numbness before.  CT head showed no acute hemorrhage or acute core infarct.   Examination: BP(152/102), Blood Glucose(117) 1A: Level of Consciousness - Alert; keenly responsive + 0 1B: Ask Month and Age - Both Questions Right + 0 1C: Blink Eyes & Squeeze Hands - Performs Both Tasks + 0 2: Test Horizontal Extraocular Movements - Normal + 0 3: Test Visual Fields - No Visual Loss + 0 4: Test Facial Palsy (Use Grimace if Obtunded) - Normal symmetry + 0 5A: Test Left Arm Motor Drift - No Drift for 10 Seconds + 0 5B: Test Right Arm Motor Drift - No Drift for 10 Seconds + 0 6A: Test Left Leg Motor Drift - No Drift for 5 Seconds + 0 6B: Test Right Leg Motor Drift - No Drift for 5 Seconds + 0 7: Test Limb Ataxia (FNF/Heel-Shin) - No Ataxia + 0 8: Test Sensation - Mild-Moderate Loss: Less Sharp/More Dull + 1 9: Test Language/Aphasia - Normal; No aphasia + 0 10: Test Dysarthria - Normal + 0 11: Test Extinction/Inattention - No abnormality + 0  NIHSS Score: 1  Patient was informed the Neurology Consult would happen via TeleHealth consult by way of interactive  audio and video telecommunications and consented to receiving care in this manner.  Due to the immediate potential for life-threatening deterioration due to underlying acute neurologic illness, I spent 35 minutes providing critical care. This time includes time for face to face visit via telemedicine, review of medical records, imaging studies and discussion of findings with providers, the patient and/or family.   Dr Jossie NgKatherine Ned Kakar   [facility] [phone]

## 2018-03-28 NOTE — ED Notes (Signed)
Patient states facial numbness "is moving from the right side to the left side and is now in the left side of my face."

## 2018-03-28 NOTE — Discharge Instructions (Addendum)
Take over the counter tylenol and ibuprofen (OR excedrin), and benadryl, as directed on packaging, with the prescription given to you today, as needed for headache.  Keep a headache diary.  Call your regular medical doctor today to schedule a follow up appointment within the next 3 days. Call the Neurologist today to schedule a follow up appointment within the next week.  Return to the Emergency Department immediately sooner if worsening.

## 2018-03-28 NOTE — ED Provider Notes (Signed)
[facility] EMERGENCY DEPARTMENT Provider Note   CSN: [id number] Arrival date & time: 03/28/18  1053   An emergency department physician performed an initial assessment on this suspected stroke patient at 1212.  History   Chief Complaint Chief Complaint  Patient presents with  . Code Stroke    HPI Shannon Wilson is a 47 y.o. male.  HPI  Pt was seen at 1210. Per pt, c/o gradual onset and persistence of constant acute flair of her chronic migraine headache since early this morning.  Describes the headache as per his usual chronic migraine headache pain pattern. Has been associated with nausea and lightheadedness.  Denies headache was sudden or maximal in onset or at any time.  Denies visual changes, no focal motor weakness, no tingling/numbness in extremities, no fevers, no neck pain, no rash, no CP/SOB, no abd pain, no vomiting/diarrhea.    1212:  During my evaluation, pt stated his right face was "numb." Code Stroke was called. Pt then told ED RN his numbness was "moving" from his right face to his left face now.    Past Medical History:  Diagnosis Date  . Back pain   . GERD (gastroesophageal reflux disease)   . Hyperlipidemia   . Hypertension   . Kidney stone   . Lumbosacral radiculopathy at S1 09/04/2017  . Migraine   . Myocardial infarction ([place])   . TIA (transient ischemic attack)   . 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 Lumbosacral radiculopathy at S1 09/04/2017  . Epidural fibrosis 09/04/2017  . TIA (transient ischemic attack) 12/25/2016  . Vitamin D deficiency 10/02/2016  . Essential hypertension 08/18/2016  . History of MI (myocardial infarction) 08/18/2016  . Coronary artery disease involving native heart without angina pectoris 08/18/2016  . Chronic low back pain 08/18/2016  . 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 Elevated random blood glucose level 08/18/2016  . Lactose intolerance in  adult 08/18/2016    Past Surgical History:  Procedure Laterality Date  . BACK SURGERY  08/2012  . SPINE SURGERY  08/2012   discectomy        Home Medications    Prior to Admission medications   Medication Sig Start Date End Date Taking? Authorizing Provider  amLODipine (NORVASC) 10 MG tablet Take 1 tablet (10 mg total) by mouth daily. 04/05/17  Yes Raylene Everts, MD  aspirin EC 81 MG tablet Take 1 tablet (81 mg total) by mouth daily. 12/25/16  Yes Annita Brod, MD  gabapentin (NEURONTIN) 300 MG capsule Take 300 mg by mouth 3 (three) times daily.   Yes [provider]  ibuprofen (ADVIL,MOTRIN) 800 MG tablet Take 1 tablet (800 mg total) by mouth every 8 (eight) hours as needed for moderate pain. 06/20/17  Yes Raylene Everts, MD  lisinopril (PRINIVIL,ZESTRIL) 40 MG tablet Take 1 tablet (40 mg total) by mouth daily. 04/05/17  Yes Raylene Everts, MD  metaxalone (SKELAXIN) 800 MG tablet Take 1 tablet (800 mg total) by mouth 3 (three) times daily. 12/14/17  Yes Idol, Almyra Free, PA-C  cyclobenzaprine (FLEXERIL) 5 MG tablet Take 1 tablet (5 mg total) by mouth at bedtime. Patient not taking: Reported on 09/04/2017 06/18/17   Raylene Everts, MD  hydrochlorothiazide (HYDRODIURIL) 12.5 MG tablet Take 1 tablet (12.5 mg total) by mouth daily. Patient not taking: Reported on 03/28/2018 04/05/17   Raylene Everts, MD  HYDROcodone-acetaminophen (NORCO/VICODIN) 5-325 MG  tablet Take 1 tablet by mouth every 4 (four) hours as needed. Patient not taking: Reported on 03/28/2018 08/12/17   Nat Christen, MD  predniSONE (STERAPRED UNI-PAK 21 TAB) 10 MG (21) TBPK tablet Taper dose over 6 days starting with 6 pills po the first day and reducing by one pill each day. Patient not taking: Reported on 03/28/2018 12/20/17   Britt Bottom, MD    Family History Family History  Problem Relation Age of Onset  . Hypertension Mother   . Heart disease Mother 24  . Cancer Father        prostate    . Sickle cell trait Father   . Sickle cell trait Son   . Cancer Maternal Grandmother        bone  . Cancer Paternal Grandmother   . Cancer Paternal Grandfather        liver  . Learning disabilities Neg Hx     Social History Social History   Tobacco Use  . Smoking status: Never Smoker  . Smokeless tobacco: Never Used  Substance Use Topics  . Alcohol use: No  . Drug use: No     Allergies   Patient has no known allergies.   Review of Systems Review of Systems ROS: Statement: All systems negative except as marked or noted in the HPI; Constitutional: Negative for fever and chills. ; ; Eyes: Negative for eye pain, redness and discharge. ; ; ENMT: Negative for ear pain, hoarseness, nasal congestion, sinus pressure and sore throat. ; ; Cardiovascular: Negative for chest pain, palpitations, diaphoresis, dyspnea and peripheral edema. ; ; Respiratory: Negative for cough, wheezing and stridor. ; ; Gastrointestinal: Negative for nausea, vomiting, diarrhea, abdominal pain, blood in stool, hematemesis, jaundice and rectal bleeding. . ; ; Genitourinary: Negative for dysuria, flank pain and hematuria. ; ; Musculoskeletal: Negative for back pain and neck pain. Negative for swelling and trauma.; ; Skin: Negative for pruritus, rash, abrasions, blisters, bruising and skin lesion.; ; Neuro: +headache, paresthesias. Negative for lightheadedness and neck stiffness. Negative for weakness, altered level of consciousness, altered mental status, extremity weakness, involuntary movement, seizure and syncope.       Physical Exam Updated Vital Signs BP (!) 131/96 (BP Location: Left Arm)   Pulse (!) 52   Temp 98.3 F (36.8 C) (Oral)   Resp 17   Ht 5' 11"  (1.803 m)   Wt 88 kg   SpO2 98%   BMI 27.06 kg/m    Patient Vitals for the past 24 hrs:  BP Temp Temp src Pulse Resp SpO2 Height Weight  03/28/18 1524 (!) 131/96 - - (!) 52 17 98 % - -  03/28/18 1430 128/86 - - (!) 49 19 99 % - -  03/28/18 1415  (!) 129/95 - - - 17 - - -  03/28/18 1400 (!) 131/98 - - - 17 - - -  03/28/18 1345 (!) 142/96 - - - 17 - - -  03/28/18 1300 (!) 145/109 - - (!) 57 17 98 % - -  03/28/18 1245 (!) 135/107 - - 64 13 97 % - -  03/28/18 1230 (!) 152/106 - - (!) 43 18 (!) 78 % - -  03/28/18 1200 (!) 151/104 - - 65 - 98 % - -  03/28/18 1130 (!) 148/113 - - 89 - 97 % - -  03/28/18 1106 (!) 155/95 98.3 F (36.8 C) Oral 72 18 98 % 5' 11"  (1.803 m) 88 kg     Physical Exam 1215:  Physical examination:  Nursing notes reviewed; Vital signs and O2 SAT reviewed;  Constitutional: Well developed, Well nourished, Well hydrated, In no acute distress; Head:  Normocephalic, atraumatic; Eyes: EOMI, PERRL, No scleral icterus; ENMT: Mouth and pharynx normal, Mucous membranes moist; Neck: Supple, Full range of motion, No lymphadenopathy; Cardiovascular: Regular rate and rhythm, No gallop; Respiratory: Breath sounds clear & equal bilaterally, No wheezes.  Speaking full sentences with ease, Normal respiratory effort/excursion; Chest: Nontender, Movement normal; Abdomen: Soft, Nontender, Nondistended, Normal bowel sounds; Genitourinary: No CVA tenderness; Extremities: Peripheral pulses normal, No tenderness, No edema, No calf edema or asymmetry.; Neuro: AA&Ox3, Major CN grossly intact. Speech clear.  No facial droop.  No nystagmus. Grips equal. Strength 5/5 equal bilat UE's and LE's.  DTR 2/4 equal bilat UE's and LE's.  No gross sensory deficits.  Normal cerebellar testing bilat UE's (finger-nose) and LE's (heel-shin)..; Skin: Color normal, Warm, Dry.   ED Treatments / Results  Labs (all labs ordered are listed, but only abnormal results are displayed)   EKG EKG Interpretation  Date/Time:  Thursday March 28 2018 12:30:21 EST Ventricular Rate:  60 PR Interval:    QRS Duration: 91 QT Interval:  429 QTC Calculation: 429 R Axis:   29 Text Interpretation:  Sinus rhythm RSR' in V1 or V2, right VCD or RVH When compared with ECG of  12/25/2016 Rate faster Otherwise no significant change Confirmed by Francine Graven [phone]) on 03/28/2018 12:34:02 PM   Radiology   Procedures Procedures (including critical care time)  Medications Ordered in ED Medications  metoCLOPramide (REGLAN) injection 10 mg (10 mg Intravenous Given 03/28/18 1256)  diphenhydrAMINE (BENADRYL) injection 50 mg (50 mg Intravenous Given 03/28/18 1257)  ketorolac (TORADOL) 30 MG/ML injection 30 mg (30 mg Intravenous Given 03/28/18 1256)  acetaminophen (TYLENOL) tablet 650 mg (650 mg Oral Given 03/28/18 1255)     Initial Impression / Assessment and Plan / ED Course  I have reviewed the triage vital signs and the nursing notes.  Pertinent labs & imaging results that were available during my care of the patient were reviewed by me and considered in my medical decision making (see chart for details).  MDM Reviewed: previous chart, nursing note and vitals Reviewed previous: labs and ECG Interpretation: labs, ECG and CT scan Total time providing critical care: 30-74 minutes. This excludes time spent performing separately reportable procedures and services. Consults: neurology   CRITICAL CARE Performed by: Francine Graven Total critical care time: 35 minutes Critical care time was exclusive of separately billable procedures and treating other patients. Critical care was necessary to treat or prevent imminent or life-threatening deterioration. Critical care was time spent personally by me on the following activities: development of treatment plan with patient and/or surrogate as well as nursing, discussions with consultants, evaluation of patient's response to treatment, examination of patient, obtaining history from patient or surrogate, ordering and performing treatments and interventions, ordering and review of laboratory studies, ordering and review of radiographic studies, pulse oximetry and re-evaluation of patient's condition.  Results for  orders placed or performed during the hospital encounter of 03/28/18  Ethanol  Result Value Ref Range   Alcohol, Ethyl (B) <10 <10 mg/dL  Protime-INR  Result Value Ref Range   Prothrombin Time 13.2 11.4 - 15.2 seconds   INR 1.01   APTT  Result Value Ref Range   aPTT 27 24 - 36 seconds  CBC  Result Value Ref Range   WBC 7.6 4.0 - 10.5 K/uL   RBC 4.87 4.22 -  5.81 MIL/uL   Hemoglobin 14.3 13.0 - 17.0 g/dL   HCT 43.0 39.0 - 52.0 %   MCV 88.3 80.0 - 100.0 fL   MCH 29.4 26.0 - 34.0 pg   MCHC 33.3 30.0 - 36.0 g/dL   RDW 12.7 11.5 - 15.5 %   Platelets 319 150 - 400 K/uL   nRBC 0.0 0.0 - 0.2 %  Differential  Result Value Ref Range   Neutrophils Relative % 57 %   Neutro Abs 4.4 1.7 - 7.7 K/uL   Lymphocytes Relative 29 %   Lymphs Abs 2.2 0.7 - 4.0 K/uL   Monocytes Relative 8 %   Monocytes Absolute 0.6 0.1 - 1.0 K/uL   Eosinophils Relative 5 %   Eosinophils Absolute 0.4 0.0 - 0.5 K/uL   Basophils Relative 1 %   Basophils Absolute 0.0 0.0 - 0.1 K/uL   Immature Granulocytes 0 %   Abs Immature Granulocytes 0.01 0.00 - 0.07 K/uL  Comprehensive metabolic panel  Result Value Ref Range   Sodium 140 135 - 145 mmol/L   Potassium 4.2 3.5 - 5.1 mmol/L   Chloride 105 98 - 111 mmol/L   CO2 29 22 - 32 mmol/L   Glucose, Bld 111 (H) 70 - 99 mg/dL   BUN 20 6 - 20 mg/dL   Creatinine, Ser 1.25 (H) 0.61 - 1.24 mg/dL   Calcium 9.5 8.9 - 10.3 mg/dL   Total Protein 7.6 6.5 - 8.1 g/dL   Albumin 4.1 3.5 - 5.0 g/dL   AST 29 15 - 41 U/L   ALT 43 0 - 44 U/L   Alkaline Phosphatase 88 38 - 126 U/L   Total Bilirubin 0.9 0.3 - 1.2 mg/dL   GFR calc non Af Amer >60 >60 mL/min   GFR calc Af Amer >60 >60 mL/min   Anion gap 6 5 - 15  Urine rapid drug screen (hosp performed)not at [facility]  Result Value Ref Range   Opiates NONE DETECTED NONE DETECTED   Cocaine NONE DETECTED NONE DETECTED   Benzodiazepines NONE DETECTED NONE DETECTED   Amphetamines NONE DETECTED NONE DETECTED   Tetrahydrocannabinol NONE  DETECTED NONE DETECTED   Barbiturates NONE DETECTED NONE DETECTED  Urinalysis, Routine w reflex microscopic  Result Value Ref Range   Color, Urine YELLOW YELLOW   APPearance CLEAR CLEAR   Specific Gravity, Urine 1.012 1.005 - 1.030   pH 6.0 5.0 - 8.0   Glucose, UA NEGATIVE NEGATIVE mg/dL   Hgb urine dipstick SMALL (A) NEGATIVE   Bilirubin Urine NEGATIVE NEGATIVE   Ketones, ur NEGATIVE NEGATIVE mg/dL   Protein, ur NEGATIVE NEGATIVE mg/dL   Nitrite NEGATIVE NEGATIVE   Leukocytes, UA NEGATIVE NEGATIVE   RBC / HPF 0-5 0 - 5 RBC/hpf   WBC, UA 0-5 0 - 5 WBC/hpf   Bacteria, UA NONE SEEN NONE SEEN   Mucus PRESENT    Hyaline Casts, UA PRESENT   I-Stat Chem 8, ED  (not at [facility], [facility])  Result Value Ref Range   Sodium 142 135 - 145 mmol/L   Potassium 3.8 3.5 - 5.1 mmol/L   Chloride 102 98 - 111 mmol/L   BUN 19 6 - 20 mg/dL   Creatinine, Ser 1.30 (H) 0.61 - 1.24 mg/dL   Glucose, Bld 111 (H) 70 - 99 mg/dL   Calcium, Ion 1.28 1.15 - 1.40 mmol/L   TCO2 31 22 - 32 mmol/L   Hemoglobin 14.6 13.0 - 17.0 g/dL   HCT 43.0 39.0 - 52.0 %  I-stat troponin, ED (not at [facility], [facility])  Result Value Ref Range   Troponin i, poc 0.00 0.00 - 0.08 ng/mL   Comment 3          CBG monitoring, ED  Result Value Ref Range   Glucose-Capillary 117 (H) 70 - 99 mg/dL   Ct Head Code Stroke Wo Contrast Result Date: 03/28/2018 CLINICAL DATA:  Code stroke. 47 year old male who woke with headache and dizziness. EXAM: CT HEAD WITHOUT CONTRAST TECHNIQUE: Contiguous axial images were obtained from the base of the skull through the vertex without intravenous contrast. COMPARISON:  Brain MRI 12/25/2016. the CTA head and neck 12/25/2016. Head CT 12/24/2016. FINDINGS: Brain: No midline shift, ventriculomegaly, mass effect, evidence of mass lesion, intracranial hemorrhage or evidence of cortically based acute infarction. Gray-white matter differentiation is within normal limits throughout the brain. Vascular: No suspicious intracranial  vascular hyperdensity. Skull: Negative. Sinuses/Orbits: New left maxillary sinus, left ethmoid and subtotal left frontal sinus opacification since September. Mild mucosal thickening at that time. Chronically opacified right ethmoid air cell is stable and other visualized paranasal sinuses and mastoids are stable and well pneumatized. Other: Visualized orbits and scalp soft tissues are within normal limits. ASPECTS ([place] Stroke Program Early CT Score) - Ganglionic level infarction (caudate, lentiform nuclei, internal capsule, insula, M1-M3 cortex): 7 - Supraganglionic infarction (M4-M6 cortex): 3 Total score (0-10 with 10 being normal): 10 IMPRESSION: 1. Stable and normal noncontrast CT appearance of the brain. ASPECTS is 10. 2. New left-side paranasal sinus disease since September. Study discussed by telephone with Dr. Francine Graven on 03/28/2018 at 12:31 . Electronically Signed   By: Genevie Ann M.D.   On: 03/28/2018 12:32     1245:  Tele Neuro Dr. Karle Barr has evaluated pt: states pt DID endorse to her that he has hx of complicated migraine with facial numbness like this previously, she feels reassured that pt's numbness was spreading from right to left face, this was not suggestive of stroke, recommends to tx headache per usual migraine meds and can be d/c from ED to f/u with outpt Neuro MD or PMD. Meds ordered.    1520:  Pt states he feels better now and is ready to go home. Tx symptomatically, f/u PMD. Dx and testing d/w pt and family.  Questions answered.  Verb understanding, agreeable to d/c home with outpt f/u.      Final Clinical Impressions(s) / ED Diagnoses   Final diagnoses:  None    ED Discharge Orders    None       Francine Graven, DO 04/01/18 [id number]

## 2018-03-28 NOTE — ED Notes (Signed)
Beeped and Called Code Stroke @ [phone]

## 2018-03-28 NOTE — ED Notes (Signed)
Patient now complaining of right sided facial numbness from right cheek radiating into chin area. Advised Dr Clarene DukeMcManus.

## 2018-03-28 NOTE — ED Notes (Signed)
Patient complaining of waking up this morning with headache and dizziness. States he has history of migraines and last migraine was approximately 6 months ago. States he took ibuprofen 2 hours prior to arrival with no relief. Also complaining of nausea.

## 2018-03-28 NOTE — ED Notes (Signed)
Per neurologist at end of consult, CT ok, to treat for migraine. No TPA to be given. Patient continues to complain of left sided facial numbness at this time.

## 2018-03-28 NOTE — ED Triage Notes (Signed)
Pt woke up with ha and dizziness at 0630 this morning.  Pt states hx of same several years ago.  + nausea, denies emesis

## 2018-03-28 NOTE — ED Notes (Signed)
Teleneuro consult in progress at this time.  ?

## 2018-04-02 NOTE — Progress Notes (Signed)
CODE STROKE 1212 CALL TIME 1215 BEEPER 1221 EXAM STARTED 1222 EXAM FINISHED  1222 IMAGES SENT TO SOC 1223 EXAM COMPLETE 1224 [place] [facility] CALLED

## 2018-05-27 ENCOUNTER — Other Ambulatory Visit: Payer: Self-pay

## 2018-05-27 ENCOUNTER — Emergency Department (HOSPITAL_COMMUNITY): Payer: Medicare Other

## 2018-05-27 ENCOUNTER — Encounter (HOSPITAL_COMMUNITY): Payer: Self-pay

## 2018-05-27 ENCOUNTER — Emergency Department (HOSPITAL_COMMUNITY)
Admission: EM | Admit: 2018-05-27 | Discharge: 2018-05-27 | Discharge status: Against Medical Advice | Payer: Medicare Other | Attending: Emergency Medicine | Admitting: Emergency Medicine

## 2018-05-27 DIAGNOSIS — Z5321 Procedure and treatment not carried out due to patient leaving prior to being seen by health care provider: Secondary | ICD-10-CM | POA: Insufficient documentation

## 2018-05-27 DIAGNOSIS — R202 Paresthesia of skin: Secondary | ICD-10-CM | POA: Insufficient documentation

## 2018-05-27 DIAGNOSIS — E782 Mixed hyperlipidemia: Secondary | ICD-10-CM | POA: Diagnosis not present

## 2018-05-27 NOTE — ED Notes (Signed)
Called for room x 1.  

## 2018-05-27 NOTE — ED Notes (Signed)
Called for room x 2.  

## 2018-05-27 NOTE — ED Notes (Signed)
Spoke with dr Clarene Duke approved CT of head

## 2018-05-27 NOTE — ED Triage Notes (Signed)
Pt reports that his face and tongue have been numb for 2 days.worse on the left side than right side  Reports feeling tired. Reports primary doctor instructed to come to ED

## 2018-06-05 DIAGNOSIS — E782 Mixed hyperlipidemia: Secondary | ICD-10-CM | POA: Diagnosis not present

## 2018-06-05 DIAGNOSIS — I10 Essential (primary) hypertension: Secondary | ICD-10-CM | POA: Diagnosis not present

## 2018-06-06 DIAGNOSIS — R202 Paresthesia of skin: Secondary | ICD-10-CM | POA: Diagnosis not present

## 2018-06-06 DIAGNOSIS — M545 Low back pain: Secondary | ICD-10-CM | POA: Diagnosis not present

## 2018-06-06 DIAGNOSIS — E782 Mixed hyperlipidemia: Secondary | ICD-10-CM | POA: Diagnosis not present

## 2018-06-18 DIAGNOSIS — M542 Cervicalgia: Secondary | ICD-10-CM | POA: Diagnosis not present

## 2018-06-18 DIAGNOSIS — M5416 Radiculopathy, lumbar region: Secondary | ICD-10-CM | POA: Diagnosis not present

## 2018-06-18 DIAGNOSIS — G894 Chronic pain syndrome: Secondary | ICD-10-CM | POA: Diagnosis not present

## 2018-08-19 DIAGNOSIS — Z0000 Encounter for general adult medical examination without abnormal findings: Secondary | ICD-10-CM | POA: Diagnosis not present

## 2018-11-03 ENCOUNTER — Encounter (HOSPITAL_COMMUNITY): Payer: Self-pay | Admitting: Emergency Medicine

## 2018-11-03 ENCOUNTER — Other Ambulatory Visit: Payer: Self-pay

## 2018-11-03 ENCOUNTER — Emergency Department (HOSPITAL_COMMUNITY): Payer: Medicare Other

## 2018-11-03 ENCOUNTER — Emergency Department (HOSPITAL_COMMUNITY)
Admission: EM | Admit: 2018-11-03 | Discharge: 2018-11-03 | Disposition: A | Discharge status: Home / Self Care | Payer: Medicare Other | Attending: Emergency Medicine | Admitting: Emergency Medicine

## 2018-11-03 DIAGNOSIS — Z7982 Long term (current) use of aspirin: Secondary | ICD-10-CM | POA: Diagnosis not present

## 2018-11-03 DIAGNOSIS — R51 Headache: Secondary | ICD-10-CM | POA: Insufficient documentation

## 2018-11-03 DIAGNOSIS — I10 Essential (primary) hypertension: Secondary | ICD-10-CM | POA: Diagnosis not present

## 2018-11-03 DIAGNOSIS — Z79899 Other long term (current) drug therapy: Secondary | ICD-10-CM | POA: Insufficient documentation

## 2018-11-03 DIAGNOSIS — R519 Headache, unspecified: Secondary | ICD-10-CM

## 2018-11-03 LAB — CBC WITH DIFFERENTIAL/PLATELET
Abs Immature Granulocytes: 0.01 10*3/uL (ref 0.00–0.07)
Basophils Absolute: 0.1 10*3/uL (ref 0.0–0.1)
Basophils Relative: 1 %
Eosinophils Absolute: 0.4 10*3/uL (ref 0.0–0.5)
Eosinophils Relative: 6 %
HCT: 40.8 % (ref 39.0–52.0)
Hemoglobin: 13.7 g/dL (ref 13.0–17.0)
Immature Granulocytes: 0 %
Lymphocytes Relative: 38 %
Lymphs Abs: 2.8 10*3/uL (ref 0.7–4.0)
MCH: 29.4 pg (ref 26.0–34.0)
MCHC: 33.6 g/dL (ref 30.0–36.0)
MCV: 87.6 fL (ref 80.0–100.0)
Monocytes Absolute: 0.6 10*3/uL (ref 0.1–1.0)
Monocytes Relative: 8 %
Neutro Abs: 3.5 10*3/uL (ref 1.7–7.7)
Neutrophils Relative %: 47 %
Platelets: 277 10*3/uL (ref 150–400)
RBC: 4.66 MIL/uL (ref 4.22–5.81)
RDW: 12.7 % (ref 11.5–15.5)
WBC: 7.4 10*3/uL (ref 4.0–10.5)
nRBC: 0 % (ref 0.0–0.2)

## 2018-11-03 LAB — BASIC METABOLIC PANEL
Anion gap: 6 (ref 5–15)
BUN: 17 mg/dL (ref 6–20)
CO2: 26 mmol/L (ref 22–32)
Calcium: 8.9 mg/dL (ref 8.9–10.3)
Chloride: 109 mmol/L (ref 98–111)
Creatinine, Ser: 1.3 mg/dL — ABNORMAL HIGH (ref 0.61–1.24)
GFR calc Af Amer: >60 mL/min (ref >60)
GFR calc non Af Amer: >60 mL/min (ref >60)
Glucose, Bld: 116 mg/dL — ABNORMAL HIGH (ref 70–99)
Potassium: 3.8 mmol/L (ref 3.5–5.1)
Sodium: 141 mmol/L (ref 135–145)

## 2018-11-03 MED ORDER — CLONIDINE HCL 0.1 MG PO TABS
0.1000 mg | ORAL_TABLET | Freq: Once | ORAL | Status: AC
  Administered 2018-11-03: 0.1 mg via ORAL
  Filled 2018-11-03: qty 1

## 2018-11-03 MED ORDER — OXYCODONE-ACETAMINOPHEN 5-325 MG PO TABS
2.0000 | ORAL_TABLET | Freq: Once | ORAL | Status: AC
  Administered 2018-11-03: 2 via ORAL
  Filled 2018-11-03: qty 2

## 2018-11-03 MED ORDER — HYDROCODONE-ACETAMINOPHEN 5-325 MG PO TABS: 1.0000 | ORAL_TABLET | ORAL | 0 refills | Status: DC | PRN

## 2018-11-03 MED ORDER — AMLODIPINE BESYLATE 10 MG PO TABS: 10.0000 mg | ORAL_TABLET | Freq: Every day | ORAL | 0 refills | Status: AC

## 2018-11-03 NOTE — Discharge Instructions (Signed)
You were seen in the emergency department for headache and elevated blood pressure.  You had a CAT scan of your head that did not show any signs of any bleeding.  Your blood work was unchanged from your prior levels.  I am refilling your blood pressure medicine that your primary care doctor stopped and also prescribing some pain medicine for a few days.  Please contact your primary care doctor regarding blood pressure management as they may want to adjust her medications.  Return if any concerns.

## 2018-11-03 NOTE — ED Triage Notes (Signed)
Pt c/o headache x 1 week intermittently, took BP at [facility] and reading was 200/121, pt reports he was on Amlodipine 5mg  x 1 dose daily and Lisinopril 20mg  x 1 dose daily was taken off Amlodipine 3 mos ago, triage BP 206/128

## 2018-11-03 NOTE — ED Provider Notes (Signed)
[facility] EMERGENCY DEPARTMENT Provider Note   CSN: [id number] Arrival date & time: 11/03/18  1946     History   Chief Complaint Chief Complaint  Patient presents with   Hypertension    HPI Phares Zaccone is a 48 y.o. male.  He has a prior history of hypertension and has chronic back issues from at least 5 years ago.  He said his doctor took him off 1 of his blood pressure medicines because he was having a side effect to it causing him to urinate a lot.  His blood pressures have been okay but he said his back pain is been worse for a week and has been having an intermittent headache for a week.  Tonight he took his blood pressure at [facility] and found it to be 200/120.  He is still taking his lisinopril.  He is complaining of chronic back pain that is been worse over the past 2 weeks although not associate with any new neurologic symptoms no bowel or bladder incontinence.  He has a generalized throbbing headache although no double vision chest pain or shortness of breath.  No fevers or cough.     The history is provided by the patient.  Hypertension This is a chronic problem. The problem occurs constantly. The problem has not changed since onset.Associated symptoms include headaches. Pertinent negatives include no chest pain, no abdominal pain and no shortness of breath. Nothing aggravates the symptoms. Nothing relieves the symptoms. He has tried nothing for the symptoms. The treatment provided no relief.    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    Patient Active Problem List   Diagnosis Date Noted   Lumbosacral radiculopathy at S1 09/04/2017   Epidural fibrosis 09/04/2017   TIA (transient  ischemic attack) 12/25/2016   Vitamin D deficiency 10/02/2016   Essential hypertension 08/18/2016   History of MI (myocardial infarction) 08/18/2016   Coronary artery disease involving native heart without angina pectoris 08/18/2016   Chronic low back pain 08/18/2016   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 Authorizing Provider  amLODipine (NORVASC) 10 MG tablet Take 1 tablet (10 mg total) by mouth daily. 04/05/17   Raylene Everts, MD  aspirin EC 81 MG tablet Take 1 tablet (81 mg total) by mouth daily. 12/25/16   Annita Brod, MD  cyclobenzaprine (FLEXERIL) 5 MG tablet Take 1 tablet (5 mg total) by mouth at bedtime. Patient not taking: Reported on 09/04/2017 06/18/17   Raylene Everts, MD  gabapentin (NEURONTIN) 300 MG capsule Take 300 mg by mouth 3 (three) times daily.    [provider]  hydrochlorothiazide (HYDRODIURIL) 12.5 MG tablet Take 1 tablet (12.5 mg total) by mouth daily. Patient not taking: Reported on 03/28/2018 04/05/17   Raylene Everts, MD  HYDROcodone-acetaminophen (NORCO/VICODIN)  5-325 MG tablet Take 1 tablet by mouth every 4 (four) hours as needed. Patient not taking: Reported on 03/28/2018 08/12/17   Nat Christen, MD  ibuprofen (ADVIL,MOTRIN) 800 MG tablet Take 1 tablet (800 mg total) by mouth every 8 (eight) hours as needed for moderate pain. 06/20/17   Raylene Everts, MD  lisinopril (PRINIVIL,ZESTRIL) 40 MG tablet Take 1 tablet (40 mg total) by mouth daily. 04/05/17   Raylene Everts, MD  metaxalone (SKELAXIN) 800 MG tablet Take 1 tablet (800 mg total) by mouth 3 (three) times daily. 12/14/17   Evalee Jefferson, PA-C    metoCLOPramide (REGLAN) 10 MG tablet Take 1 tablet (10 mg total) by mouth every 6 (six) hours as needed for nausea (or headache). 03/28/18   Francine Graven, DO  predniSONE (STERAPRED UNI-PAK 21 TAB) 10 MG (21) TBPK tablet Taper dose over 6 days starting with 6 pills po the first day and reducing by one pill each day. Patient not taking: Reported on 03/28/2018 12/20/17   Britt Bottom, MD    Family History Family History  Problem Relation Age of Onset   Hypertension Mother    Heart disease Mother 78   Cancer Father        prostate   Sickle cell trait Father    Sickle cell trait Son    Cancer Maternal Grandmother        bone   Cancer Paternal Grandmother    Cancer Paternal Grandfather        liver   Learning disabilities Neg Hx     Social History Social History   Tobacco Use   Smoking status: Never Smoker   Smokeless tobacco: Never Used  Substance Use Topics   Alcohol use: No   Drug use: No     Allergies   Patient has no known allergies.   Review of Systems Review of Systems  Constitutional: Negative for chills and fever.  HENT: Negative for ear pain and sore throat.   Eyes: Negative for pain and visual disturbance.  Respiratory: Negative for cough and shortness of breath.   Cardiovascular: Negative for chest pain and palpitations.  Gastrointestinal: Negative for abdominal pain and vomiting.  Genitourinary: Negative for dysuria and hematuria.  Musculoskeletal: Positive for back pain. Negative for arthralgias.  Skin: Negative for color change and rash.  Neurological: Positive for headaches. Negative for seizures and syncope.  All other systems reviewed and are negative.    Physical Exam Updated Vital Signs BP (!) 206/128 (BP Location: Right Arm)    Pulse 70    Temp 98.6 F (37 C) (Oral)    Resp 18    Ht 5' 11"  (1.803 m)    Wt 88.9 kg    SpO2 96%    BMI 27.34 kg/m   Physical Exam Vitals signs and nursing note reviewed.  Constitutional:       Appearance: He is well-developed.  HENT:     Head: Normocephalic and atraumatic.  Eyes:     Conjunctiva/sclera: Conjunctivae normal.  Neck:     Musculoskeletal: Neck supple.  Cardiovascular:     Rate and Rhythm: Normal rate and regular rhythm.     Heart sounds: No murmur.  Pulmonary:     Effort: Pulmonary effort is normal. No respiratory distress.     Breath sounds: Normal breath sounds.  Abdominal:     Palpations: Abdomen is soft.     Tenderness: There is no abdominal tenderness.  Musculoskeletal: Normal range of motion.  Skin:  General: Skin is warm and dry.     Capillary Refill: Capillary refill takes less than 2 seconds.  Neurological:     General: No focal deficit present.     Mental Status: He is alert and oriented to person, place, and time.      ED Treatments / Results  Labs (all labs ordered are listed, but only abnormal results are displayed) Labs Reviewed  BASIC METABOLIC PANEL - Abnormal; Notable for the following components:      Result Value   Glucose, Bld 116 (*)    Creatinine, Ser 1.30 (*)    All other components within normal limits  CBC WITH DIFFERENTIAL/PLATELET    EKG None  Radiology Ct Head Wo Contrast  Result Date: 11/03/2018 CLINICAL DATA:  Headache x1 week, hypertension EXAM: CT HEAD WITHOUT CONTRAST TECHNIQUE: Contiguous axial images were obtained from the base of the skull through the vertex without intravenous contrast. COMPARISON:  03/28/2018 FINDINGS: Brain: No evidence of acute infarction, hemorrhage, hydrocephalus, extra-axial collection or mass lesion/mass effect. Vascular: No hyperdense vessel or unexpected calcification. Skull: Normal. Negative for fracture or focal lesion. Sinuses/Orbits: Partial opacification of the bilateral ethmoid sinuses. Mastoid air cells are clear. Other: None. IMPRESSION: Normal head CT. Electronically Signed   By: Julian Hy M.D.   On: 11/03/2018 22:14    Procedures Procedures (including critical  care time)  Medications Ordered in ED Medications  oxyCODONE-acetaminophen (PERCOCET/ROXICET) 5-325 MG per tablet 2 tablet (2 tablets Oral Given 11/03/18 2127)  cloNIDine (CATAPRES) tablet 0.1 mg (0.1 mg Oral Given 11/03/18 2319)     Initial Impression / Assessment and Plan / ED Course  I have reviewed the triage vital signs and the nursing notes.  Pertinent labs & imaging results that were available during my care of the patient were reviewed by me and considered in my medical decision making (see chart for details).  Clinical Course as of Nov 03 940  Sun Nov 02, 3144  [id number] 48 year old male with known hypertension here with elevated blood pressures and headache.  Nonfocal neuro exam.  Differential includes hypertensive emergency, head bleed, hypertension setting of chronic pain.  Checking labs BMP head CT.  Order him some p.o. pain medicine.   [MB]  2250 Mr. Dempster EKG is sinus bradycardia with a rate of 57 no acute ST-T changes.   [MB]  2256 Patient's lab shows creatinine slightly elevated but baseline for him.  He still remains hypertensive here at 180/110 or 180/115.  He sitting comfortably watching TV.   [MB]    Clinical Course User Index [MB] Hayden Rasmussen, MD        Final Clinical Impressions(s) / ED Diagnoses   Final diagnoses:  Hypertension, unspecified type  Generalized headache    ED Discharge Orders    None       Hayden Rasmussen, MD 11/04/18 [phone]

## 2018-11-04 DIAGNOSIS — M545 Low back pain: Secondary | ICD-10-CM | POA: Diagnosis not present

## 2018-11-04 DIAGNOSIS — I10 Essential (primary) hypertension: Secondary | ICD-10-CM | POA: Diagnosis not present

## 2018-11-05 ENCOUNTER — Emergency Department (HOSPITAL_COMMUNITY): Payer: Medicare Other

## 2018-11-05 ENCOUNTER — Emergency Department (HOSPITAL_COMMUNITY)
Admission: EM | Admit: 2018-11-05 | Discharge: 2018-11-05 | Disposition: A | Discharge status: Home / Self Care | Payer: Medicare Other | Attending: Emergency Medicine | Admitting: Emergency Medicine

## 2018-11-05 ENCOUNTER — Encounter (HOSPITAL_COMMUNITY): Payer: Self-pay | Admitting: Emergency Medicine

## 2018-11-05 ENCOUNTER — Other Ambulatory Visit: Payer: Self-pay

## 2018-11-05 DIAGNOSIS — R519 Headache, unspecified: Secondary | ICD-10-CM

## 2018-11-05 DIAGNOSIS — I252 Old myocardial infarction: Secondary | ICD-10-CM | POA: Diagnosis not present

## 2018-11-05 DIAGNOSIS — Z8673 Personal history of transient ischemic attack (TIA), and cerebral infarction without residual deficits: Secondary | ICD-10-CM | POA: Insufficient documentation

## 2018-11-05 DIAGNOSIS — R1084 Generalized abdominal pain: Secondary | ICD-10-CM | POA: Diagnosis not present

## 2018-11-05 DIAGNOSIS — R112 Nausea with vomiting, unspecified: Secondary | ICD-10-CM | POA: Insufficient documentation

## 2018-11-05 DIAGNOSIS — Z7982 Long term (current) use of aspirin: Secondary | ICD-10-CM | POA: Diagnosis not present

## 2018-11-05 DIAGNOSIS — Z79899 Other long term (current) drug therapy: Secondary | ICD-10-CM | POA: Insufficient documentation

## 2018-11-05 DIAGNOSIS — I10 Essential (primary) hypertension: Secondary | ICD-10-CM | POA: Insufficient documentation

## 2018-11-05 DIAGNOSIS — R109 Unspecified abdominal pain: Secondary | ICD-10-CM | POA: Diagnosis present

## 2018-11-05 DIAGNOSIS — R51 Headache: Secondary | ICD-10-CM | POA: Diagnosis not present

## 2018-11-05 LAB — CBC WITH DIFFERENTIAL/PLATELET
Abs Immature Granulocytes: 0.05 10*3/uL (ref 0.00–0.07)
Basophils Absolute: 0 10*3/uL (ref 0.0–0.1)
Basophils Relative: 0 %
Eosinophils Absolute: 0 10*3/uL (ref 0.0–0.5)
Eosinophils Relative: 0 %
HCT: 46.1 % (ref 39.0–52.0)
Hemoglobin: 15.8 g/dL (ref 13.0–17.0)
Immature Granulocytes: 1 %
Lymphocytes Relative: 12 %
Lymphs Abs: 0.9 10*3/uL (ref 0.7–4.0)
MCH: 29.5 pg (ref 26.0–34.0)
MCHC: 34.3 g/dL (ref 30.0–36.0)
MCV: 86 fL (ref 80.0–100.0)
Monocytes Absolute: 0.2 10*3/uL (ref 0.1–1.0)
Monocytes Relative: 3 %
Neutro Abs: 6.4 10*3/uL (ref 1.7–7.7)
Neutrophils Relative %: 84 %
Platelets: 372 10*3/uL (ref 150–400)
RBC: 5.36 MIL/uL (ref 4.22–5.81)
RDW: 12.1 % (ref 11.5–15.5)
WBC: 7.6 10*3/uL (ref 4.0–10.5)
nRBC: 0 % (ref 0.0–0.2)

## 2018-11-05 LAB — COMPREHENSIVE METABOLIC PANEL
ALT: 35 U/L (ref 0–44)
AST: 28 U/L (ref 15–41)
Albumin: 4.9 g/dL (ref 3.5–5.0)
Alkaline Phosphatase: 108 U/L (ref 38–126)
Anion gap: 12 (ref 5–15)
BUN: 14 mg/dL (ref 6–20)
CO2: 23 mmol/L (ref 22–32)
Calcium: 9.9 mg/dL (ref 8.9–10.3)
Chloride: 107 mmol/L (ref 98–111)
Creatinine, Ser: 1.15 mg/dL (ref 0.61–1.24)
GFR calc Af Amer: >60 mL/min (ref >60)
GFR calc non Af Amer: >60 mL/min (ref >60)
Glucose, Bld: 159 mg/dL — ABNORMAL HIGH (ref 70–99)
Potassium: 3.5 mmol/L (ref 3.5–5.1)
Sodium: 142 mmol/L (ref 135–145)
Total Bilirubin: 0.8 mg/dL (ref 0.3–1.2)
Total Protein: 8.7 g/dL — ABNORMAL HIGH (ref 6.5–8.1)

## 2018-11-05 LAB — CSF CELL COUNT WITH DIFFERENTIAL
RBC Count, CSF: 235 /mm3 — ABNORMAL HIGH
RBC Count, CSF: 3 /mm3 — ABNORMAL HIGH
Tube #: 1
Tube #: 4
WBC, CSF: 0 /mm3 (ref 0–5)
WBC, CSF: 0 /mm3 (ref 0–5)

## 2018-11-05 LAB — LACTIC ACID, PLASMA
Lactic Acid, Venous: 3.4 mmol/L (ref 0.5–1.9)
Lactic Acid, Venous: 1.4 mmol/L (ref 0.5–1.9)

## 2018-11-05 LAB — PROTEIN, CSF: Total  Protein, CSF: 27 mg/dL (ref 15–45)

## 2018-11-05 LAB — GLUCOSE, CSF: Glucose, CSF: 71 mg/dL — ABNORMAL HIGH (ref 40–70)

## 2018-11-05 MED ORDER — ONDANSETRON 8 MG PO TBDP: 8.0000 mg | ORAL_TABLET | Freq: Three times a day (TID) | ORAL | 0 refills | Status: DC | PRN

## 2018-11-05 MED ORDER — ACETAMINOPHEN ER 650 MG PO TBCR: 650.0000 mg | EXTENDED_RELEASE_TABLET | Freq: Three times a day (TID) | ORAL | 0 refills | Status: DC

## 2018-11-05 MED ORDER — MORPHINE SULFATE (PF) 4 MG/ML IV SOLN
4.0000 mg | Freq: Once | INTRAVENOUS | Status: AC
  Administered 2018-11-05: 4 mg via INTRAVENOUS
  Filled 2018-11-05: qty 1

## 2018-11-05 MED ORDER — SODIUM CHLORIDE 0.9 % IV BOLUS
1000.0000 mL | Freq: Once | INTRAVENOUS | Status: AC
  Administered 2018-11-05: 1000 mL via INTRAVENOUS

## 2018-11-05 MED ORDER — METOCLOPRAMIDE HCL 5 MG/ML IJ SOLN
10.0000 mg | Freq: Once | INTRAMUSCULAR | Status: AC
  Administered 2018-11-05: 10 mg via INTRAVENOUS
  Filled 2018-11-05: qty 2

## 2018-11-05 MED ORDER — KETOROLAC TROMETHAMINE 30 MG/ML IJ SOLN
15.0000 mg | Freq: Once | INTRAMUSCULAR | Status: AC
  Administered 2018-11-05: 15 mg via INTRAVENOUS
  Filled 2018-11-05: qty 1

## 2018-11-05 MED ORDER — IOHEXOL 300 MG/ML  SOLN: 100.0000 mL | Freq: Once | INTRAMUSCULAR | Status: DC | PRN

## 2018-11-05 MED ORDER — MIDAZOLAM HCL 2 MG/2ML IJ SOLN
2.0000 mg | Freq: Once | INTRAMUSCULAR | Status: AC
  Administered 2018-11-05: 2 mg via INTRAVENOUS
  Filled 2018-11-05: qty 2

## 2018-11-05 MED ORDER — IBUPROFEN 600 MG PO TABS: 600.0000 mg | ORAL_TABLET | Freq: Four times a day (QID) | ORAL | 0 refills | Status: DC | PRN

## 2018-11-05 NOTE — ED Provider Notes (Signed)
[facility] EMERGENCY DEPARTMENT Provider Note   CSN: [id number] Arrival date & time: 11/05/18  1020    History   Chief Complaint Chief Complaint  Patient presents with  . Abdominal Pain  . Emesis    HPI Shannon Wilson is a 48 y.o. male.     HPI  48 year old male comes in a chief complaint of abdominal pain, headaches and vomiting. Patient has history of migraines, hypertension, abdominal pain, CAD, celiac artery vasculitis.  He reports that he started having severe headache at 5 AM which woke him up from his sleep.  Headache is described as posterior headache, that is throbbing in nature with associated nausea and vomiting.  He had similar headaches 2 days ago when he came to the ER.  At that time a CT scan of the brain was completed which was negative and patient was noted to be hypertensive so he was given BP medications.  Patient states that his headache resolved, but it returned with much more intensity.  He is also having abdominal pain, nausea and vomiting.  The abdominal pain and the headaches are different than the symptoms he has had with his vasculitis and migraines in the past.  Past Medical History:  Diagnosis Date  . Back pain   . GERD (gastroesophageal reflux disease)   . Hyperlipidemia   . Hypertension   . Kidney stone   . Lumbosacral radiculopathy at S1 09/04/2017  . Migraine   . Myocardial infarction ([place])   . TIA (transient ischemic attack)   . Vasculitis ([place])    celiac artery vasculitis    Patient Active Problem List   Diagnosis Date Noted  . Lumbosacral radiculopathy at S1 09/04/2017  . Epidural fibrosis 09/04/2017  . TIA (transient ischemic attack) 12/25/2016  . Vitamin D deficiency 10/02/2016  . Essential hypertension 08/18/2016  . History of MI (myocardial infarction) 08/18/2016  . Coronary artery disease involving native heart without angina pectoris 08/18/2016  . Chronic low back pain 08/18/2016  . 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  Elevated random blood glucose level 08/18/2016  . Lactose intolerance in adult 08/18/2016    Past Surgical History:  Procedure Laterality Date  . BACK SURGERY  08/2012  . SPINE SURGERY  08/2012   discectomy        Home Medications    Prior to Admission medications   Medication Sig Start Date End Date Taking? Authorizing Provider  amLODipine (NORVASC) 10 MG tablet Take 1 tablet (10 mg total) by mouth daily. 11/03/18  Yes Hayden Rasmussen, MD  aspirin EC 81 MG tablet Take 1 tablet (81 mg total) by mouth daily. 12/25/16  Yes Annita Brod, MD  gabapentin (NEURONTIN) 300 MG capsule Take 300 mg by mouth 3 (three) times daily.   Yes [provider]  HYDROcodone-acetaminophen (NORCO/VICODIN) 5-325 MG tablet Take 1 tablet by mouth every 4 (four) hours as needed. 11/03/18  Yes Hayden Rasmussen, MD  lisinopril (PRINIVIL,ZESTRIL) 40 MG tablet Take 1 tablet (40 mg total) by mouth daily. 04/05/17  Yes Raylene Everts, MD  acetaminophen (TYLENOL 8 HOUR) 650 MG CR tablet Take 1 tablet (650 mg total) by mouth every 8 (eight) hours. 11/05/18   Varney Biles, MD  cyclobenzaprine (FLEXERIL) 5 MG tablet Take 1 tablet (5 mg total) by mouth at bedtime. Patient not taking: Reported on 09/04/2017 06/18/17   Raylene Everts, MD  hydrochlorothiazide (HYDRODIURIL) 12.5 MG tablet Take 1 tablet (12.5 mg total) by mouth daily. Patient not taking:  Reported on 03/28/2018 04/05/17   Raylene Everts, MD  ibuprofen (ADVIL) 600 MG tablet Take 1 tablet (600 mg total) by mouth every 6 (six) hours as needed. 11/05/18   Varney Biles, MD  metaxalone (SKELAXIN) 800 MG tablet Take 1 tablet (800 mg total) by mouth 3 (three) times daily. Patient not taking: Reported on 11/03/2018 12/14/17   Evalee Jefferson, PA-C  metoCLOPramide (REGLAN) 10 MG tablet Take 1 tablet (10 mg total) by mouth every 6 (six) hours as needed for nausea (or headache). Patient not taking: Reported on 11/05/2018 03/28/18   Francine Graven,  DO  ondansetron (ZOFRAN ODT) 8 MG disintegrating tablet Take 1 tablet (8 mg total) by mouth every 8 (eight) hours as needed for nausea. 11/05/18   Varney Biles, MD  predniSONE (STERAPRED UNI-PAK 21 TAB) 10 MG (21) TBPK tablet Taper dose over 6 days starting with 6 pills po the first day and reducing by one pill each day. Patient not taking: Reported on 03/28/2018 12/20/17   Britt Bottom, MD    Family History Family History  Problem Relation Age of Onset  . Hypertension Mother   . Heart disease Mother 38  . Cancer Father        prostate  . Sickle cell trait Father   . Sickle cell trait Son   . Cancer Maternal Grandmother        bone  . Cancer Paternal Grandmother   . Cancer Paternal Grandfather        liver  . Learning disabilities Neg Hx     Social History Social History   Tobacco Use  . Smoking status: Never Smoker  . Smokeless tobacco: Never Used  Substance Use Topics  . Alcohol use: No  . Drug use: No     Allergies   Patient has no known allergies.   Review of Systems Review of Systems  Constitutional: Positive for activity change.  Gastrointestinal: Positive for abdominal pain, nausea and vomiting.  Allergic/Immunologic: Negative for immunocompromised state.  Neurological: Positive for headaches.  Hematological: Does not bruise/bleed easily.  All other systems reviewed and are negative.    Physical Exam Updated Vital Signs BP (!) 156/103   Pulse 72   Temp 97.9 F (36.6 C) (Oral)   Resp 16   Ht 5' 11"  (1.803 m)   Wt 82.1 kg   SpO2 97%   BMI 25.24 kg/m   Physical Exam Vitals signs and nursing note reviewed.  Constitutional:      Appearance: He is well-developed.  HENT:     Head: Atraumatic.  Eyes:     Extraocular Movements: Extraocular movements intact.     Pupils: Pupils are equal, round, and reactive to light.  Neck:     Musculoskeletal: Neck supple.  Cardiovascular:     Rate and Rhythm: Normal rate.  Pulmonary:     Effort:  Pulmonary effort is normal.  Abdominal:     Tenderness: There is abdominal tenderness in the periumbilical area, left upper quadrant and left lower quadrant.  Skin:    General: Skin is warm.  Neurological:     General: No focal deficit present.     Mental Status: He is alert and oriented to person, place, and time.     Cranial Nerves: No cranial nerve deficit.     Motor: No weakness.      ED Treatments / Results  Labs (all labs ordered are listed, but only abnormal results are displayed) Labs Reviewed  COMPREHENSIVE METABOLIC PANEL -  Abnormal; Notable for the following components:      Result Value   Glucose, Bld 159 (*)    Total Protein 8.7 (*)    All other components within normal limits  LACTIC ACID, PLASMA - Abnormal; Notable for the following components:   Lactic Acid, Venous 3.4 (*)    All other components within normal limits  CSF CELL COUNT WITH DIFFERENTIAL - Abnormal; Notable for the following components:   Color, CSF CLEAR (*)    Appearance, CSF COLORLESS (*)    RBC Count, CSF 235 (*)    All other components within normal limits  CSF CELL COUNT WITH DIFFERENTIAL - Abnormal; Notable for the following components:   Color, CSF CLEAR (*)    Appearance, CSF COLORLESS (*)    RBC Count, CSF 3 (*)    All other components within normal limits  GLUCOSE, CSF - Abnormal; Notable for the following components:   Glucose, CSF 71 (*)    All other components within normal limits  CSF CULTURE  CBC WITH DIFFERENTIAL/PLATELET  LACTIC ACID, PLASMA  PROTEIN, CSF    EKG None  Radiology Ct Head Wo Contrast  Result Date: 11/03/2018 CLINICAL DATA:  Headache x1 week, hypertension EXAM: CT HEAD WITHOUT CONTRAST TECHNIQUE: Contiguous axial images were obtained from the base of the skull through the vertex without intravenous contrast. COMPARISON:  03/28/2018 FINDINGS: Brain: No evidence of acute infarction, hemorrhage, hydrocephalus, extra-axial collection or mass lesion/mass effect.  Vascular: No hyperdense vessel or unexpected calcification. Skull: Normal. Negative for fracture or focal lesion. Sinuses/Orbits: Partial opacification of the bilateral ethmoid sinuses. Mastoid air cells are clear. Other: None. IMPRESSION: Normal head CT. Electronically Signed   By: Julian Hy M.D.   On: 11/03/2018 22:14    Procedures .Lumbar Puncture  Date/Time: 11/05/2018 12:58 PM Performed by: Varney Biles, MD Authorized by: Varney Biles, MD   Consent:    Consent obtained:  Written   Consent given by:  Patient   Risks discussed:  Bleeding, infection, pain, repeat procedure, nerve damage and headache   Alternatives discussed:  No treatment Pre-procedure details:    Procedure purpose:  Diagnostic Sedation:    Sedation type:  Anxiolysis Anesthesia (see MAR for exact dosages):    Anesthesia method:  Local infiltration   Local anesthetic:  Lidocaine 1% WITH epi Procedure details:    Lumbar space:  L4-L5 interspace   Needle gauge:  20   Needle type:  Diamond point   Ultrasound guidance: no     Number of attempts:  1   Fluid appearance:  Clear   Tubes of fluid:  4   Total volume (ml):  8 Post-procedure:    Puncture site:  Adhesive bandage applied and direct pressure applied   Patient tolerance of procedure:  Tolerated well, no immediate complications   (including critical care time)  Medications Ordered in ED Medications  iohexol (OMNIPAQUE) 300 MG/ML solution 100 mL (has no administration in time range)  metoCLOPramide (REGLAN) injection 10 mg (10 mg Intravenous Given 11/05/18 1127)  morphine 4 MG/ML injection 4 mg (4 mg Intravenous Given 11/05/18 1128)  sodium chloride 0.9 % bolus 1,000 mL (1,000 mLs Intravenous New Bag/Given 11/05/18 1130)  midazolam (VERSED) injection 2 mg (2 mg Intravenous Given 11/05/18 1129)  ketorolac (TORADOL) 30 MG/ML injection 15 mg (15 mg Intravenous Given 11/05/18 1430)  metoCLOPramide (REGLAN) injection 10 mg (10 mg Intravenous Given  11/05/18 1430)     Initial Impression / Assessment and Plan / ED Course  I have reviewed the triage vital signs and the nursing notes.  Pertinent labs & imaging results that were available during my care of the patient were reviewed by me and considered in my medical decision making (see chart for details).  Clinical Course as of Nov 04 1525  Tue Nov 05, 2018  1527 Upon reassessment, patient reports that the headache has resolved. She continued to have no neurologic complains. Strict return precautions discussed, pt will return to the ER if there is visual complains, seizures, altered mental status, loss of consciousness, dizziness, new focal weakness, or numbness.      [AN]  1527 CSF is clear. Stable for discharge.  RBC Count, CSF(!): 3 [AN]    Clinical Course User Index [AN] Varney Biles, MD       48 year old male who comes in a chief complaint of headache, abdominal pain, nausea and vomiting.  His symptoms started 5 AM.  Pain has been constant and described as 10 out of 10 headache.  The pain is different than his prior migraine.  He however did have similar headache 2 days ago when he came to the ER and was noted to be hypertensive.  CT scan of the brain was negative at that time and patient was discharged with his BP meds once the pain was controlled.  His pain is radiating down towards his neck and is not similar to migraines, therefore with the associated nausea and vomiting differential diagnosis would include subarachnoid hemorrhage.  Clinical suspicion for meningitis or brain infection is low.  Additionally patient can be having change in his migraine leading to atypical headache along with abdominal pain.  Other possibility includes allergic reaction as he states that he took hydrocodone before he went to bed.  LP was completed after patient was consented. During my reassessment his abdominal pain resolved without any medications administered.  We will cancel the CT scan of  the abdomen. CT head, CT angios head and neck from prior visits have been reviewed.  Final Clinical Impressions(s) / ED Diagnoses   Final diagnoses:  Bad headache  Generalized abdominal pain    ED Discharge Orders         Ordered    ondansetron (ZOFRAN ODT) 8 MG disintegrating tablet  Every 8 hours PRN     11/05/18 1525    acetaminophen (TYLENOL 8 HOUR) 650 MG CR tablet  Every 8 hours     11/05/18 1525    ibuprofen (ADVIL) 600 MG tablet  Every 6 hours PRN     11/05/18 [place], Annemarie Sebree, MD 11/05/18 1527

## 2018-11-05 NOTE — Discharge Instructions (Addendum)
Follow-up with neurologist if the headaches persist.  Take the Tylenol and ibuprofen as prescribed.  Zofran as your nausea medication.  Return to the ER if symptoms get worse.

## 2018-11-05 NOTE — ED Notes (Signed)
Date and time results received: 11/05/18 1142 (use smartphrase ".now" to insert current time)  Test: Lactic Acid Critical Value: 3.4  Name of Provider Notified: Dr Kathrynn Humble Orders Received? Or Actions Taken?: NA

## 2018-11-05 NOTE — ED Triage Notes (Signed)
Pt from home. Reports waking up with left sided abdominal pain with n/v at 0500 this am.  Pt was placed on some HTN meds yesterday. Unable to take this morning. BP 168/123

## 2018-11-08 LAB — CSF CULTURE W GRAM STAIN
Culture: NO GROWTH
Gram Stain: NONE SEEN
Special Requests: NORMAL

## 2018-11-11 DIAGNOSIS — G8929 Other chronic pain: Secondary | ICD-10-CM | POA: Diagnosis not present

## 2018-11-11 DIAGNOSIS — M545 Low back pain: Secondary | ICD-10-CM | POA: Diagnosis not present

## 2018-11-11 DIAGNOSIS — I10 Essential (primary) hypertension: Secondary | ICD-10-CM | POA: Diagnosis not present

## 2018-11-11 DIAGNOSIS — E782 Mixed hyperlipidemia: Secondary | ICD-10-CM | POA: Diagnosis not present

## 2018-11-19 ENCOUNTER — Other Ambulatory Visit: Payer: Self-pay

## 2018-11-19 NOTE — Patient Outreach (Signed)
[facility]) Care Management  11/19/2018  Keaghan Staton 09/06/1970 [id number]   Medication Adherence call to Mr. Arthur Speagle [facility] Verify spoke with patient he is past due on Lisinopril 20 mg patient explain he was taken off this medication for at least 4-5 month but now doctor has put him back on it patient has enough for two more weeks patient will see doctor in two weeks and will ask doctor to refill this medication. Mr. Lave is showing past due under [place].   [place] [facility] Direct Dial [phone]  Fax [phone] [email or web address]

## 2018-11-27 ENCOUNTER — Other Ambulatory Visit: Payer: Self-pay | Admitting: Family Medicine

## 2018-11-27 DIAGNOSIS — E782 Mixed hyperlipidemia: Secondary | ICD-10-CM | POA: Diagnosis not present

## 2018-11-27 DIAGNOSIS — I10 Essential (primary) hypertension: Secondary | ICD-10-CM | POA: Diagnosis not present

## 2018-11-27 DIAGNOSIS — R229 Localized swelling, mass and lump, unspecified: Secondary | ICD-10-CM | POA: Diagnosis not present

## 2018-12-05 DIAGNOSIS — M545 Low back pain: Secondary | ICD-10-CM | POA: Diagnosis not present

## 2018-12-05 DIAGNOSIS — I10 Essential (primary) hypertension: Secondary | ICD-10-CM | POA: Diagnosis not present

## 2018-12-05 DIAGNOSIS — R7301 Impaired fasting glucose: Secondary | ICD-10-CM | POA: Diagnosis not present

## 2018-12-05 DIAGNOSIS — G894 Chronic pain syndrome: Secondary | ICD-10-CM | POA: Diagnosis not present

## 2018-12-05 DIAGNOSIS — E785 Hyperlipidemia, unspecified: Secondary | ICD-10-CM | POA: Diagnosis not present

## 2018-12-06 ENCOUNTER — Ambulatory Visit (HOSPITAL_COMMUNITY)
Admission: RE | Admit: 2018-12-06 | Discharge: 2018-12-06 | Disposition: A | Discharge status: Home / Self Care | Payer: Medicare Other | Source: Ambulatory Visit | Attending: Family Medicine | Admitting: Family Medicine

## 2018-12-06 ENCOUNTER — Other Ambulatory Visit: Payer: Self-pay

## 2018-12-06 ENCOUNTER — Encounter (HOSPITAL_COMMUNITY): Payer: Self-pay

## 2018-12-06 DIAGNOSIS — R112 Nausea with vomiting, unspecified: Secondary | ICD-10-CM | POA: Diagnosis not present

## 2018-12-06 DIAGNOSIS — R229 Localized swelling, mass and lump, unspecified: Secondary | ICD-10-CM | POA: Diagnosis not present

## 2018-12-13 DIAGNOSIS — M545 Low back pain: Secondary | ICD-10-CM | POA: Diagnosis not present

## 2018-12-13 DIAGNOSIS — I10 Essential (primary) hypertension: Secondary | ICD-10-CM | POA: Diagnosis not present

## 2018-12-13 DIAGNOSIS — E782 Mixed hyperlipidemia: Secondary | ICD-10-CM | POA: Diagnosis not present

## 2018-12-13 DIAGNOSIS — G8929 Other chronic pain: Secondary | ICD-10-CM | POA: Diagnosis not present

## 2019-01-14 DIAGNOSIS — G8929 Other chronic pain: Secondary | ICD-10-CM | POA: Diagnosis not present

## 2019-01-14 DIAGNOSIS — M545 Low back pain: Secondary | ICD-10-CM | POA: Diagnosis not present

## 2019-01-14 DIAGNOSIS — E782 Mixed hyperlipidemia: Secondary | ICD-10-CM | POA: Diagnosis not present

## 2019-01-14 DIAGNOSIS — I10 Essential (primary) hypertension: Secondary | ICD-10-CM | POA: Diagnosis not present

## 2019-02-10 ENCOUNTER — Other Ambulatory Visit: Payer: Self-pay

## 2019-02-10 DIAGNOSIS — Z20822 Contact with and (suspected) exposure to covid-19: Secondary | ICD-10-CM

## 2019-02-12 LAB — NOVEL CORONAVIRUS, NAA: SARS-CoV-2, NAA: NOT DETECTED

## 2019-05-01 DIAGNOSIS — E7849 Other hyperlipidemia: Secondary | ICD-10-CM | POA: Diagnosis not present

## 2019-05-01 DIAGNOSIS — I10 Essential (primary) hypertension: Secondary | ICD-10-CM | POA: Diagnosis not present

## 2019-05-22 DIAGNOSIS — E7849 Other hyperlipidemia: Secondary | ICD-10-CM | POA: Diagnosis not present

## 2019-05-22 DIAGNOSIS — I10 Essential (primary) hypertension: Secondary | ICD-10-CM | POA: Diagnosis not present

## 2019-06-03 DIAGNOSIS — R202 Paresthesia of skin: Secondary | ICD-10-CM | POA: Diagnosis not present

## 2019-06-03 DIAGNOSIS — E782 Mixed hyperlipidemia: Secondary | ICD-10-CM | POA: Diagnosis not present

## 2019-06-03 DIAGNOSIS — R7301 Impaired fasting glucose: Secondary | ICD-10-CM | POA: Diagnosis not present

## 2019-06-03 DIAGNOSIS — G8929 Other chronic pain: Secondary | ICD-10-CM | POA: Diagnosis not present

## 2019-06-03 DIAGNOSIS — I10 Essential (primary) hypertension: Secondary | ICD-10-CM | POA: Diagnosis not present

## 2019-06-03 DIAGNOSIS — M545 Low back pain: Secondary | ICD-10-CM | POA: Diagnosis not present

## 2019-06-05 DIAGNOSIS — Z0001 Encounter for general adult medical examination with abnormal findings: Secondary | ICD-10-CM | POA: Diagnosis not present

## 2019-06-05 DIAGNOSIS — R945 Abnormal results of liver function studies: Secondary | ICD-10-CM | POA: Diagnosis not present

## 2019-06-05 DIAGNOSIS — R7303 Prediabetes: Secondary | ICD-10-CM | POA: Diagnosis not present

## 2019-06-05 DIAGNOSIS — I10 Essential (primary) hypertension: Secondary | ICD-10-CM | POA: Diagnosis not present

## 2019-06-05 DIAGNOSIS — E785 Hyperlipidemia, unspecified: Secondary | ICD-10-CM | POA: Diagnosis not present

## 2019-06-09 DIAGNOSIS — M533 Sacrococcygeal disorders, not elsewhere classified: Secondary | ICD-10-CM | POA: Diagnosis not present

## 2019-06-09 DIAGNOSIS — M545 Low back pain: Secondary | ICD-10-CM | POA: Diagnosis not present

## 2019-06-09 DIAGNOSIS — M5417 Radiculopathy, lumbosacral region: Secondary | ICD-10-CM | POA: Diagnosis not present

## 2019-06-09 DIAGNOSIS — G894 Chronic pain syndrome: Secondary | ICD-10-CM | POA: Diagnosis not present

## 2019-07-11 DIAGNOSIS — M5416 Radiculopathy, lumbar region: Secondary | ICD-10-CM | POA: Diagnosis not present

## 2019-07-11 DIAGNOSIS — M545 Low back pain: Secondary | ICD-10-CM | POA: Diagnosis not present

## 2019-07-28 DIAGNOSIS — I10 Essential (primary) hypertension: Secondary | ICD-10-CM | POA: Diagnosis not present

## 2019-07-28 DIAGNOSIS — E7849 Other hyperlipidemia: Secondary | ICD-10-CM | POA: Diagnosis not present

## 2019-07-30 DIAGNOSIS — L237 Allergic contact dermatitis due to plants, except food: Secondary | ICD-10-CM | POA: Diagnosis not present

## 2019-07-30 DIAGNOSIS — W57XXXA Bitten or stung by nonvenomous insect and other nonvenomous arthropods, initial encounter: Secondary | ICD-10-CM | POA: Diagnosis not present

## 2019-08-11 DIAGNOSIS — Z79899 Other long term (current) drug therapy: Secondary | ICD-10-CM | POA: Diagnosis not present

## 2019-08-11 DIAGNOSIS — I10 Essential (primary) hypertension: Secondary | ICD-10-CM | POA: Diagnosis not present

## 2019-08-11 DIAGNOSIS — M5416 Radiculopathy, lumbar region: Secondary | ICD-10-CM | POA: Diagnosis not present

## 2019-08-11 DIAGNOSIS — M545 Low back pain: Secondary | ICD-10-CM | POA: Diagnosis not present

## 2019-10-06 DIAGNOSIS — M545 Low back pain: Secondary | ICD-10-CM | POA: Diagnosis not present

## 2019-10-08 DIAGNOSIS — M545 Low back pain: Secondary | ICD-10-CM | POA: Diagnosis not present

## 2019-10-08 DIAGNOSIS — M5417 Radiculopathy, lumbosacral region: Secondary | ICD-10-CM | POA: Diagnosis not present

## 2019-10-08 DIAGNOSIS — I10 Essential (primary) hypertension: Secondary | ICD-10-CM | POA: Diagnosis not present

## 2019-10-08 DIAGNOSIS — Z79899 Other long term (current) drug therapy: Secondary | ICD-10-CM | POA: Diagnosis not present

## 2019-10-08 DIAGNOSIS — M5416 Radiculopathy, lumbar region: Secondary | ICD-10-CM | POA: Diagnosis not present

## 2019-10-10 DIAGNOSIS — I10 Essential (primary) hypertension: Secondary | ICD-10-CM | POA: Diagnosis not present

## 2019-10-10 DIAGNOSIS — E7849 Other hyperlipidemia: Secondary | ICD-10-CM | POA: Diagnosis not present

## 2019-11-13 DIAGNOSIS — I10 Essential (primary) hypertension: Secondary | ICD-10-CM | POA: Diagnosis not present

## 2019-11-13 DIAGNOSIS — E7849 Other hyperlipidemia: Secondary | ICD-10-CM | POA: Diagnosis not present

## 2019-12-01 DIAGNOSIS — R202 Paresthesia of skin: Secondary | ICD-10-CM | POA: Diagnosis not present

## 2019-12-01 DIAGNOSIS — R7301 Impaired fasting glucose: Secondary | ICD-10-CM | POA: Diagnosis not present

## 2019-12-01 DIAGNOSIS — M545 Low back pain: Secondary | ICD-10-CM | POA: Diagnosis not present

## 2019-12-01 DIAGNOSIS — G8929 Other chronic pain: Secondary | ICD-10-CM | POA: Diagnosis not present

## 2019-12-01 DIAGNOSIS — E782 Mixed hyperlipidemia: Secondary | ICD-10-CM | POA: Diagnosis not present

## 2019-12-01 DIAGNOSIS — I10 Essential (primary) hypertension: Secondary | ICD-10-CM | POA: Diagnosis not present

## 2019-12-04 DIAGNOSIS — I10 Essential (primary) hypertension: Secondary | ICD-10-CM | POA: Diagnosis not present

## 2019-12-04 DIAGNOSIS — M545 Low back pain: Secondary | ICD-10-CM | POA: Diagnosis not present

## 2019-12-04 DIAGNOSIS — G894 Chronic pain syndrome: Secondary | ICD-10-CM | POA: Diagnosis not present

## 2019-12-04 DIAGNOSIS — R7303 Prediabetes: Secondary | ICD-10-CM | POA: Diagnosis not present

## 2019-12-04 DIAGNOSIS — E785 Hyperlipidemia, unspecified: Secondary | ICD-10-CM | POA: Diagnosis not present

## 2019-12-23 DIAGNOSIS — Z79899 Other long term (current) drug therapy: Secondary | ICD-10-CM | POA: Diagnosis not present

## 2019-12-23 DIAGNOSIS — E559 Vitamin D deficiency, unspecified: Secondary | ICD-10-CM | POA: Diagnosis not present

## 2019-12-23 DIAGNOSIS — M129 Arthropathy, unspecified: Secondary | ICD-10-CM | POA: Diagnosis not present

## 2019-12-23 DIAGNOSIS — M545 Low back pain: Secondary | ICD-10-CM | POA: Diagnosis not present

## 2019-12-23 DIAGNOSIS — G894 Chronic pain syndrome: Secondary | ICD-10-CM | POA: Diagnosis not present

## 2020-01-06 DIAGNOSIS — M545 Low back pain: Secondary | ICD-10-CM | POA: Diagnosis not present

## 2020-01-06 DIAGNOSIS — Z79899 Other long term (current) drug therapy: Secondary | ICD-10-CM | POA: Diagnosis not present

## 2020-01-06 DIAGNOSIS — G894 Chronic pain syndrome: Secondary | ICD-10-CM | POA: Diagnosis not present

## 2020-01-13 DIAGNOSIS — M545 Low back pain, unspecified: Secondary | ICD-10-CM | POA: Diagnosis not present

## 2020-01-13 DIAGNOSIS — M5442 Lumbago with sciatica, left side: Secondary | ICD-10-CM | POA: Diagnosis not present

## 2020-01-21 DIAGNOSIS — E782 Mixed hyperlipidemia: Secondary | ICD-10-CM | POA: Diagnosis not present

## 2020-01-21 DIAGNOSIS — I10 Essential (primary) hypertension: Secondary | ICD-10-CM | POA: Diagnosis not present

## 2020-01-21 DIAGNOSIS — G8929 Other chronic pain: Secondary | ICD-10-CM | POA: Diagnosis not present

## 2020-02-05 DIAGNOSIS — M545 Low back pain, unspecified: Secondary | ICD-10-CM | POA: Diagnosis not present

## 2020-02-05 DIAGNOSIS — Z79899 Other long term (current) drug therapy: Secondary | ICD-10-CM | POA: Diagnosis not present

## 2020-02-05 DIAGNOSIS — G894 Chronic pain syndrome: Secondary | ICD-10-CM | POA: Diagnosis not present

## 2020-02-25 DIAGNOSIS — M545 Low back pain, unspecified: Secondary | ICD-10-CM | POA: Diagnosis not present

## 2020-03-01 DIAGNOSIS — M5442 Lumbago with sciatica, left side: Secondary | ICD-10-CM | POA: Diagnosis not present

## 2020-03-03 DIAGNOSIS — M545 Low back pain, unspecified: Secondary | ICD-10-CM | POA: Diagnosis not present

## 2020-03-03 DIAGNOSIS — Z79899 Other long term (current) drug therapy: Secondary | ICD-10-CM | POA: Diagnosis not present

## 2020-03-03 DIAGNOSIS — G894 Chronic pain syndrome: Secondary | ICD-10-CM | POA: Diagnosis not present

## 2020-03-09 DIAGNOSIS — R319 Hematuria, unspecified: Secondary | ICD-10-CM | POA: Diagnosis not present

## 2020-03-09 DIAGNOSIS — N390 Urinary tract infection, site not specified: Secondary | ICD-10-CM | POA: Diagnosis not present

## 2020-03-09 DIAGNOSIS — M545 Low back pain, unspecified: Secondary | ICD-10-CM | POA: Diagnosis not present

## 2020-03-12 ENCOUNTER — Other Ambulatory Visit: Payer: Self-pay | Admitting: Internal Medicine

## 2020-03-12 ENCOUNTER — Other Ambulatory Visit (HOSPITAL_COMMUNITY): Payer: Self-pay | Admitting: Internal Medicine

## 2020-03-12 ENCOUNTER — Other Ambulatory Visit: Payer: Self-pay

## 2020-03-12 ENCOUNTER — Ambulatory Visit (HOSPITAL_COMMUNITY)
Admission: RE | Admit: 2020-03-12 | Discharge: 2020-03-12 | Disposition: A | Discharge status: Home / Self Care | Payer: Medicare Other | Source: Ambulatory Visit | Attending: Internal Medicine | Admitting: Internal Medicine

## 2020-03-12 DIAGNOSIS — R319 Hematuria, unspecified: Secondary | ICD-10-CM | POA: Diagnosis not present

## 2020-03-12 DIAGNOSIS — K4090 Unilateral inguinal hernia, without obstruction or gangrene, not specified as recurrent: Secondary | ICD-10-CM | POA: Diagnosis not present

## 2020-04-09 DIAGNOSIS — I10 Essential (primary) hypertension: Secondary | ICD-10-CM | POA: Diagnosis not present

## 2020-04-09 DIAGNOSIS — E782 Mixed hyperlipidemia: Secondary | ICD-10-CM | POA: Diagnosis not present

## 2020-04-29 ENCOUNTER — Ambulatory Visit: Payer: Medicare Other | Admitting: Urology

## 2020-05-03 DIAGNOSIS — G894 Chronic pain syndrome: Secondary | ICD-10-CM | POA: Diagnosis not present

## 2020-05-03 DIAGNOSIS — Z79899 Other long term (current) drug therapy: Secondary | ICD-10-CM | POA: Diagnosis not present

## 2020-05-03 DIAGNOSIS — M545 Low back pain, unspecified: Secondary | ICD-10-CM | POA: Diagnosis not present

## 2020-05-08 DIAGNOSIS — G8929 Other chronic pain: Secondary | ICD-10-CM | POA: Diagnosis not present

## 2020-05-08 DIAGNOSIS — I10 Essential (primary) hypertension: Secondary | ICD-10-CM | POA: Diagnosis not present

## 2020-05-08 DIAGNOSIS — E782 Mixed hyperlipidemia: Secondary | ICD-10-CM | POA: Diagnosis not present

## 2020-05-11 DIAGNOSIS — G894 Chronic pain syndrome: Secondary | ICD-10-CM | POA: Diagnosis not present

## 2020-05-17 ENCOUNTER — Ambulatory Visit: Payer: Medicare Other | Admitting: Urology

## 2020-05-31 DIAGNOSIS — R7301 Impaired fasting glucose: Secondary | ICD-10-CM | POA: Diagnosis not present

## 2020-05-31 DIAGNOSIS — R202 Paresthesia of skin: Secondary | ICD-10-CM | POA: Diagnosis not present

## 2020-05-31 DIAGNOSIS — E782 Mixed hyperlipidemia: Secondary | ICD-10-CM | POA: Diagnosis not present

## 2020-05-31 DIAGNOSIS — E7849 Other hyperlipidemia: Secondary | ICD-10-CM | POA: Diagnosis not present

## 2020-05-31 DIAGNOSIS — I10 Essential (primary) hypertension: Secondary | ICD-10-CM | POA: Diagnosis not present

## 2020-05-31 DIAGNOSIS — G8929 Other chronic pain: Secondary | ICD-10-CM | POA: Diagnosis not present

## 2020-06-07 DIAGNOSIS — E782 Mixed hyperlipidemia: Secondary | ICD-10-CM | POA: Diagnosis not present

## 2020-06-07 DIAGNOSIS — I10 Essential (primary) hypertension: Secondary | ICD-10-CM | POA: Diagnosis not present

## 2020-06-08 DIAGNOSIS — G894 Chronic pain syndrome: Secondary | ICD-10-CM | POA: Diagnosis not present

## 2020-06-09 DIAGNOSIS — K4090 Unilateral inguinal hernia, without obstruction or gangrene, not specified as recurrent: Secondary | ICD-10-CM | POA: Diagnosis not present

## 2020-06-09 DIAGNOSIS — M545 Low back pain, unspecified: Secondary | ICD-10-CM | POA: Diagnosis not present

## 2020-06-09 DIAGNOSIS — E785 Hyperlipidemia, unspecified: Secondary | ICD-10-CM | POA: Diagnosis not present

## 2020-06-09 DIAGNOSIS — G894 Chronic pain syndrome: Secondary | ICD-10-CM | POA: Diagnosis not present

## 2020-06-09 DIAGNOSIS — R7301 Impaired fasting glucose: Secondary | ICD-10-CM | POA: Diagnosis not present

## 2020-06-09 DIAGNOSIS — R202 Paresthesia of skin: Secondary | ICD-10-CM | POA: Diagnosis not present

## 2020-06-09 DIAGNOSIS — G8929 Other chronic pain: Secondary | ICD-10-CM | POA: Diagnosis not present

## 2020-06-09 DIAGNOSIS — I10 Essential (primary) hypertension: Secondary | ICD-10-CM | POA: Diagnosis not present

## 2020-06-09 DIAGNOSIS — R7303 Prediabetes: Secondary | ICD-10-CM | POA: Diagnosis not present

## 2020-06-09 DIAGNOSIS — K4091 Unilateral inguinal hernia, without obstruction or gangrene, recurrent: Secondary | ICD-10-CM | POA: Diagnosis not present

## 2020-06-09 DIAGNOSIS — E7849 Other hyperlipidemia: Secondary | ICD-10-CM | POA: Diagnosis not present

## 2020-06-09 DIAGNOSIS — E782 Mixed hyperlipidemia: Secondary | ICD-10-CM | POA: Diagnosis not present

## 2020-06-09 DIAGNOSIS — Z712 Person consulting for explanation of examination or test findings: Secondary | ICD-10-CM | POA: Diagnosis not present

## 2020-06-28 ENCOUNTER — Ambulatory Visit: Payer: Medicare Other | Admitting: Urology

## 2020-06-28 NOTE — Progress Notes (Incomplete)
H&P  Chief Complaint: ***  History of Present Illness: ***  Past Medical History:  Diagnosis Date  . Back pain   . GERD (gastroesophageal reflux disease)   . Hyperlipidemia   . Hypertension   . Kidney stone   . Lumbosacral radiculopathy at S1 09/04/2017  . Migraine   . Myocardial infarction (HCC)   . TIA (transient ischemic attack)   . Vasculitis (HCC)    celiac artery vasculitis    Past Surgical History:  Procedure Laterality Date  . BACK SURGERY  08/2012  . SPINE SURGERY  08/2012   discectomy    Home Medications:  Allergies as of 06/29/2020   No Known Allergies     Medication List       Accurate as of June 28, 2020 11:52 AM. If you have any questions, ask your nurse or doctor.        acetaminophen 650 MG CR tablet Commonly known as: Tylenol 8 Hour Take 1 tablet (650 mg total) by mouth every 8 (eight) hours.   amLODipine 10 MG tablet Commonly known as: NORVASC Take 1 tablet (10 mg total) by mouth daily.   aspirin EC 81 MG tablet Take 1 tablet (81 mg total) by mouth daily.   cyclobenzaprine 5 MG tablet Commonly known as: FLEXERIL Take 1 tablet (5 mg total) by mouth at bedtime.   gabapentin 300 MG capsule Commonly known as: NEURONTIN Take 300 mg by mouth 3 (three) times daily.   hydrochlorothiazide 12.5 MG tablet Commonly known as: HYDRODIURIL Take 1 tablet (12.5 mg total) by mouth daily.   HYDROcodone-acetaminophen 5-325 MG tablet Commonly known as: NORCO/VICODIN Take 1 tablet by mouth every 4 (four) hours as needed.   ibuprofen 600 MG tablet Commonly known as: ADVIL Take 1 tablet (600 mg total) by mouth every 6 (six) hours as needed.   lisinopril 40 MG tablet Commonly known as: ZESTRIL Take 1 tablet (40 mg total) by mouth daily.   metaxalone 800 MG tablet Commonly known as: SKELAXIN Take 1 tablet (800 mg total) by mouth 3 (three) times daily.   metoCLOPramide 10 MG tablet Commonly known as: REGLAN Take 1 tablet (10 mg total) by mouth  every 6 (six) hours as needed for nausea (or headache).   ondansetron 8 MG disintegrating tablet Commonly known as: Zofran ODT Take 1 tablet (8 mg total) by mouth every 8 (eight) hours as needed for nausea.   predniSONE 10 MG (21) Tbpk tablet Commonly known as: STERAPRED UNI-PAK 21 TAB Taper dose over 6 days starting with 6 pills po the first day and reducing by one pill each day.       Allergies: No Known Allergies  Family History  Problem Relation Age of Onset  . Hypertension Mother   . Heart disease Mother 56  . Cancer Father        prostate  . Sickle cell trait Father   . Sickle cell trait Son   . Cancer Maternal Grandmother        bone  . Cancer Paternal Grandmother   . Cancer Paternal Grandfather        liver  . Learning disabilities Neg Hx     Social History:  reports that he has never smoked. He has never used smokeless tobacco. He reports that he does not drink alcohol and does not use drugs.  ROS: A complete review of systems was performed.  All systems are negative except for pertinent findings as noted.  Physical Exam:  Vital signs  in last 24 hours: There were no vitals taken for this visit. Constitutional:  Alert and oriented, No acute distress Cardiovascular: Regular rate  Respiratory: Normal respiratory effort GI: Abdomen is soft, nontender, nondistended, no abdominal masses. No CVAT.  Genitourinary: Normal male phallus, testes are descended bilaterally and non-tender and without masses, scrotum is normal in appearance without lesions or masses, perineum is normal on inspection. Lymphatic: No lymphadenopathy Neurologic: Grossly intact, no focal deficits Psychiatric: Normal mood and affect  Laboratory Data:  No results for input(s): WBC, HGB, HCT, PLT in the last 72 hours.  No results for input(s): NA, K, CL, GLUCOSE, BUN, CALCIUM, CREATININE in the last 72 hours.  Invalid input(s): CO3   No results found for this or any previous visit (from the  past 24 hour(s)). No results found for this or any previous visit (from the past 240 hour(s)).  Renal Function: No results for input(s): CREATININE in the last 168 hours. CrCl cannot be calculated (Patient's most recent lab result is older than the maximum 21 days allowed.).  Radiologic Imaging: No results found.  Impression/Assessment:  ***  Plan:  ***

## 2020-06-29 ENCOUNTER — Ambulatory Visit: Payer: Medicare Other | Admitting: Urology

## 2020-07-03 ENCOUNTER — Emergency Department (HOSPITAL_COMMUNITY)
Admission: EM | Admit: 2020-07-03 | Discharge: 2020-07-03 | Disposition: A | Discharge status: Home / Self Care | Payer: Medicare Other | Attending: Emergency Medicine | Admitting: Emergency Medicine

## 2020-07-03 ENCOUNTER — Other Ambulatory Visit: Payer: Self-pay

## 2020-07-03 ENCOUNTER — Encounter (HOSPITAL_COMMUNITY): Payer: Self-pay | Admitting: Emergency Medicine

## 2020-07-03 DIAGNOSIS — I10 Essential (primary) hypertension: Secondary | ICD-10-CM | POA: Insufficient documentation

## 2020-07-03 DIAGNOSIS — M62830 Muscle spasm of back: Secondary | ICD-10-CM | POA: Insufficient documentation

## 2020-07-03 DIAGNOSIS — Z79899 Other long term (current) drug therapy: Secondary | ICD-10-CM | POA: Diagnosis not present

## 2020-07-03 DIAGNOSIS — M545 Low back pain, unspecified: Secondary | ICD-10-CM | POA: Diagnosis present

## 2020-07-03 DIAGNOSIS — I2510 Atherosclerotic heart disease of native coronary artery without angina pectoris: Secondary | ICD-10-CM | POA: Diagnosis not present

## 2020-07-03 DIAGNOSIS — Z7982 Long term (current) use of aspirin: Secondary | ICD-10-CM | POA: Insufficient documentation

## 2020-07-03 MED ORDER — KETOROLAC TROMETHAMINE 60 MG/2ML IM SOLN
60.0000 mg | Freq: Once | INTRAMUSCULAR | Status: AC
  Administered 2020-07-03: 60 mg via INTRAMUSCULAR
  Filled 2020-07-03: qty 2

## 2020-07-03 MED ORDER — IBUPROFEN 600 MG PO TABS: 600.0000 mg | ORAL_TABLET | Freq: Four times a day (QID) | ORAL | 0 refills | Status: DC | PRN

## 2020-07-03 MED ORDER — PREDNISONE 20 MG PO TABS: 40.0000 mg | ORAL_TABLET | Freq: Every day | ORAL | 0 refills | Status: AC

## 2020-07-03 MED ORDER — DICLOFENAC SODIUM 1 % EX GEL: 2.0000 g | Freq: Four times a day (QID) | CUTANEOUS | 0 refills | Status: DC
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
## 2020-07-03 NOTE — Discharge Instructions (Addendum)
Your history and physical exam is suggestive of muscle spasm in the right side of thoracolumbar region.  I suspect that your symptoms will improve with continued Flexeril twice daily, heating pad to the affected area, continued NSAIDs, Voltaren gel, and a short course of prednisone.  Do not combine the prescribed ibuprofen with other NSAIDs aside from the topical Voltaren gel.  Take the prednisone in the morning with breakfast.    Please follow-up with your primary care provider as well as your team at [facility].  Continue with light stretching exercises.    Return to the ED or seek immediate medical attention should you experience any true muscle weakness or leg numbness, urinary retention, urinary or fecal incontinence, fevers or chills, or any other new or worsening symptoms.

## 2020-07-03 NOTE — ED Triage Notes (Signed)
Pt tot he ED with chronic back pain that has gotten progressively worse today.  Pt states his right leg is numb and tingling from hip to foot and is giving out on him.  Denies loss of bowel or bladder.

## 2020-07-07 DIAGNOSIS — I10 Essential (primary) hypertension: Secondary | ICD-10-CM | POA: Diagnosis not present

## 2020-07-07 DIAGNOSIS — E782 Mixed hyperlipidemia: Secondary | ICD-10-CM | POA: Diagnosis not present

## 2020-07-07 DIAGNOSIS — G8929 Other chronic pain: Secondary | ICD-10-CM | POA: Diagnosis not present

## 2020-07-09 DIAGNOSIS — G894 Chronic pain syndrome: Secondary | ICD-10-CM | POA: Diagnosis not present

## 2020-07-14 DIAGNOSIS — G894 Chronic pain syndrome: Secondary | ICD-10-CM | POA: Diagnosis not present

## 2020-07-14 DIAGNOSIS — M545 Low back pain, unspecified: Secondary | ICD-10-CM | POA: Diagnosis not present

## 2020-08-08 DIAGNOSIS — Z79899 Other long term (current) drug therapy: Secondary | ICD-10-CM | POA: Diagnosis not present

## 2020-08-08 DIAGNOSIS — G894 Chronic pain syndrome: Secondary | ICD-10-CM | POA: Diagnosis not present

## 2020-08-08 DIAGNOSIS — I10 Essential (primary) hypertension: Secondary | ICD-10-CM | POA: Diagnosis not present

## 2020-08-08 DIAGNOSIS — E749 Disorder of carbohydrate metabolism, unspecified: Secondary | ICD-10-CM | POA: Diagnosis not present

## 2020-09-08 DIAGNOSIS — Z79899 Other long term (current) drug therapy: Secondary | ICD-10-CM | POA: Diagnosis not present

## 2020-09-08 DIAGNOSIS — G894 Chronic pain syndrome: Secondary | ICD-10-CM | POA: Diagnosis not present

## 2020-09-14 DIAGNOSIS — M545 Low back pain, unspecified: Secondary | ICD-10-CM | POA: Diagnosis not present

## 2020-09-14 DIAGNOSIS — Z1211 Encounter for screening for malignant neoplasm of colon: Secondary | ICD-10-CM | POA: Diagnosis not present

## 2020-09-14 DIAGNOSIS — G894 Chronic pain syndrome: Secondary | ICD-10-CM | POA: Diagnosis not present

## 2020-09-23 DIAGNOSIS — K4090 Unilateral inguinal hernia, without obstruction or gangrene, not specified as recurrent: Secondary | ICD-10-CM | POA: Diagnosis not present

## 2020-10-07 ENCOUNTER — Ambulatory Visit: Payer: Medicare Other | Admitting: General Surgery

## 2020-10-07 ENCOUNTER — Encounter: Payer: Self-pay | Admitting: General Surgery

## 2020-10-07 ENCOUNTER — Other Ambulatory Visit: Payer: Self-pay

## 2020-10-07 VITALS — BP 144/88 | HR 75 | Temp 98.1°F | Resp 14 | Ht 71.0 in | Wt 191.2 lb

## 2020-10-07 DIAGNOSIS — K4090 Unilateral inguinal hernia, without obstruction or gangrene, not specified as recurrent: Secondary | ICD-10-CM | POA: Diagnosis not present

## 2020-10-07 DIAGNOSIS — I10 Essential (primary) hypertension: Secondary | ICD-10-CM | POA: Diagnosis not present

## 2020-10-07 DIAGNOSIS — E7849 Other hyperlipidemia: Secondary | ICD-10-CM | POA: Diagnosis not present

## 2020-10-07 NOTE — Patient Instructions (Signed)
Hernia, Adult     A hernia happens when tissue inside your body pushes out through a weak spot in your belly muscles (abdominal wall). This makes a round lump (bulge). The lump may be: In a scar from surgery that was done in your belly (incisional hernia). Near your belly button (umbilical hernia). In your groin (inguinal hernia). Your groin is the area where your leg meets your lower belly (abdomen). This kind of hernia could also be: In your scrotum, if you are male. In folds of skin around your vagina, if you are male. In your upper thigh (femoral hernia). Inside your belly (hiatal hernia). This happens when your stomach slides above the muscle between your belly and your chest (diaphragm). If your hernia is small and it does not cause pain, you may not need treatment.If your hernia is large or it causes pain, you may need surgery. Follow these instructions at home: Activity Avoid stretching or overusing (straining) the muscles near your hernia. Straining can happen when you: Lift something heavy. Poop (have a bowel movement). Do not lift anything that is heavier than 10 lb (4.5 kg), or the limit that you are told, until your doctor says that it is safe. Use the strength of your legs when you lift something heavy. Do not use only your back muscles to lift. General instructions Do these things if told by your doctor so you do not have trouble pooping (constipation): Drink enough fluid to keep your pee (urine) pale yellow. Eat foods that are high in fiber. These include fresh fruits and vegetables, whole grains, and beans. Limit foods that are high in fat and processed sugars. These include foods that are fried or sweet. Take medicine for trouble pooping. When you cough, try to cough gently. You may try to push your hernia in by very gently pressing on it when you are lying down. Do not try to force the bulge back in if it will not push in easily. If you are overweight, work with your  doctor to lose weight safely. Do not use any products that have nicotine or tobacco in them. These include cigarettes and e-cigarettes. If you need help quitting, ask your doctor. If you will be having surgery (hernia repair), watch your hernia for changes in shape, size, or color. Tell your doctor if you see any changes. Take over-the-counter and prescription medicines only as told by your doctor. Keep all follow-up visits as told by your doctor. Contact a doctor if: You get new pain, swelling, or redness near your hernia. You poop fewer times in a week than normal. You have trouble pooping. You have poop (stool) that is more dry than normal. You have poop that is harder or larger than normal. Get help right away if: You have a fever. You have belly pain that gets worse. You feel sick to your stomach (nauseous). You throw up (vomit). Your hernia cannot be pushed in by very gently pressing on it when you are lying down. Do not try to force the bulge back in if it will not push in easily. Your hernia: Changes in shape or size. Changes color. Feels hard or it hurts when you touch it. These symptoms may represent a serious problem that is an emergency. Do not wait to see if the symptoms will go away. Get medical help right away. Call your local emergency services (911 in the [place] Summary A hernia happens when tissue inside your body pushes out through a weak spot in  the belly muscles. This creates a bulge. If your hernia is small and it does not hurt, you may not need treatment. If your hernia is large or it hurts, you may need surgery. If you will be having surgery, watch your hernia for changes in shape, size, or color. Tell your doctor about any changes. This information is not intended to replace advice given to you by your health care provider. Make sure you discuss any questions you have with your healthcare provider. Document Revised: 07/18/2018 Document Reviewed:  12/27/2016 Elsevier Patient Education  2021 [place] [employer], Adult Open hernia repair is a surgical procedure to fix a hernia. A hernia occurs when an internal organ or tissue pushes through a weak spot in the muscles along the wall of the abdomen. Hernias commonly occur in the groin and aroundthe belly button. Most hernias tend to get worse over time. Often, surgery is done to prevent the hernia from becoming bigger, uncomfortable, or an emergency. Emergency surgery may be needed if contents of the abdomen get stuck in the opening (incarcerated hernia) or if the blood supply gets cut off (strangulated hernia). In an open repair, an incision is made in the abdomen to perform the surgery. Tell a health care provider about: Any allergies you have. All medicines you are taking, including vitamins, herbs, eye drops, creams, and over-the-counter medicines. Any problems you or family members have had with anesthetic medicines. Any blood or bone disorders you have. Any surgeries you have had. Any medical conditions you have, including any recent cold or flu (influenza)symptoms. Whether you are pregnant or may be pregnant. What are the risks? Generally, this is a safe procedure. However, problems may occur, including: Long-lasting (chronic) pain. Bleeding. Infection. Damage to the testicles. This can cause shrinking or swelling. Damage to nearby structures or organs, including the bladder, blood vessels, intestines, or nerves near the hernia. Blood clots. Trouble passing urine. Return of the hernia. What happens before the procedure? Medicines Ask your health care provider about: Changing or stopping your regular medicines. This is especially important if you are taking diabetes medicines or blood thinners. Taking medicines such as aspirin and ibuprofen. These medicines can thin your blood. Do not take these medicines unless your health care provider tells you to take  them. Taking over-the-counter medicines, vitamins, herbs, and supplements. Surgery safety Ask your health care provider: How your surgery site will be marked. What steps will be taken to help prevent infection. These steps may include: Removing hair at the surgery site. Washing skin with a germ-killing soap. Receiving antibiotic medicine. General instructions You may have an exam or testing, such as blood tests or imaging studies. Do not use any products that contain nicotine or tobacco for at least 4 weeks before the procedure. These products include cigarettes, chewing tobacco, and vaping devices, such as e-cigarettes. If you need help quitting, ask your health care provider. Let your health care provider know if you develop a cold or any infection before your surgery. If you get an infection before surgery, you may receive antibiotics to treat it. Plan to have a responsible adult take you home from the hospital or clinic. If you will be going home right after the procedure, plan to have a responsible adult care for you for the time you are told. This is important. What happens during the procedure?  An IV will be inserted into one of your veins. You will be given one or more of the following:  A medicine to help you relax (sedative). A medicine to numb the area (local anesthetic). A medicine to make you fall asleep (general anesthetic). Your surgeon will make an incision over the hernia. The tissues of the hernia will be moved back into place. The edges of the hernia may be stitched (sutured) together. The opening in the abdominal muscles will be closed with stitches (sutures). Or, your surgeon will place a mesh patch made of artificial (synthetic) material over the opening. The incision will be closed with sutures, skin glue, or adhesive strips. A bandage (dressing) may be placed over the incision. The procedure may vary among health care providers and hospitals. What happens after  the procedure? Your blood pressure, heart rate, breathing rate, and blood oxygen level will be monitored until you leave the hospital or clinic. You may be given medicine for pain. If you were given a sedative during the procedure, it can affect you for several hours. Do not drive or operate machinery until your health care provider says that it is safe. Summary Open hernia repair is a surgical procedure to fix a hernia. Hernias commonly occur in the groin and around the belly button. Emergency surgery may be needed if contents of the abdomen get stuck in the opening (incarcerated hernia) or if the blood supply gets cut off (strangulated hernia). In this procedure, an incision is made in the abdomen to perform the surgery. After the procedure, you may be given medicine for pain. This information is not intended to replace advice given to you by your health care provider. Make sure you discuss any questions you have with your healthcare provider. Document Revised: 11/10/2019 Document Reviewed: 11/10/2019 Elsevier Patient Education  2022 [employer].

## 2020-10-08 DIAGNOSIS — G894 Chronic pain syndrome: Secondary | ICD-10-CM | POA: Diagnosis not present

## 2020-10-08 DIAGNOSIS — Z79899 Other long term (current) drug therapy: Secondary | ICD-10-CM | POA: Diagnosis not present

## 2020-10-12 NOTE — Progress Notes (Signed)
[facility] History and Physical  Reason for Referral: Right inguinal hernia  Referring Physician: Celene Squibb, MD    Chief Complaint   New Patient (Initial Visit)     Shannon Wilson is a 50 y.o. male.  HPI: Shannon Wilson is a 50 yo on disability from his chronic back pain who has a history of vasculitis in 2017 and reported to have had a MI and TIA in 2014  when he lived in [place]. He says he was never put on plavix or had any cardiac catheterization or stents. He says that he saw multiple specialist and no one could really tell what was going on or why it happened. He has no chest pain or SOB and does work around the house but is limited due to his chronic pain. He says he can walk and has no chest pain or SOB. He has been having a worsening bulge in the right groin for several weeks now and it is getting larger and more painful.   He saw Dr. Johnsie Cancel in 2018 and had EKG and ECHO done that were reassuring. He did check Sedimentation rate and go through his prior records and reports that a rheumatology workup etc was completed.  The patient denies any active chest pain or SOB.    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    Past Surgical History:  Procedure Laterality Date   BACK SURGERY  08/2012   SPINE SURGERY  08/2012   discectomy    Family History  Problem Relation Age of Onset   Hypertension Mother    Heart disease Mother 44   Cancer Father        prostate   Sickle cell trait Father    Sickle cell trait Son    Cancer Maternal Grandmother        bone   Cancer Paternal Grandmother    Cancer Paternal Grandfather        liver   Learning disabilities Neg Hx     Social History    Tobacco Use   Smoking status: Never   Smokeless tobacco: Never  Vaping Use   Vaping Use: Never used  Substance Use Topics   Alcohol use: No   Drug use: No    Medications: I have reviewed the patient's current medications. Allergies as of 10/07/2020       Reactions   Hydrocodone-acetaminophen Nausea And Vomiting        Medication List        Accurate as of October 07, 2020 11:59 PM. If you have any questions, ask your nurse or doctor.          STOP taking these medications    diclofenac Sodium 1 % Gel Commonly known as: Voltaren Stopped by: Virl Cagey, MD   hydrochlorothiazide 12.5 MG tablet Commonly known as: HYDRODIURIL Stopped by: Virl Cagey, MD   HYDROcodone-acetaminophen 5-325 MG tablet Commonly known as: NORCO/VICODIN Stopped by: Virl Cagey, MD       TAKE these medications    acetaminophen 650 MG CR tablet Commonly known as: Tylenol 8 Hour Take 1 tablet (650 mg total) by mouth  every 8 (eight) hours.   amLODipine 10 MG tablet Commonly known as: NORVASC Take 1 tablet (10 mg total) by mouth daily.   aspirin EC 81 MG tablet Take 1 tablet (81 mg total) by mouth daily.   cyclobenzaprine 5 MG tablet Commonly known as: FLEXERIL Take 1 tablet (5 mg total) by mouth at bedtime.   gabapentin 300 MG capsule Commonly known as: NEURONTIN Take 300 mg by mouth 3 (three) times daily.   ibuprofen 600 MG tablet Commonly known as: ADVIL Take 1 tablet (600 mg total) by mouth every 6 (six) hours as needed.   lisinopril 40 MG tablet Commonly known as: ZESTRIL Take 1 tablet (40 mg total) by mouth daily.   metaxalone 800 MG tablet Commonly known as: SKELAXIN Take 1 tablet (800 mg total) by mouth 3 (three) times daily.   metoCLOPramide 10 MG tablet Commonly known as: REGLAN Take 1 tablet (10 mg total) by mouth every 6 (six) hours as needed for nausea (or headache).   ondansetron 8 MG disintegrating tablet Commonly known as: Zofran  ODT Take 1 tablet (8 mg total) by mouth every 8 (eight) hours as needed for nausea.         ROS:  A comprehensive review of systems was negative except for: Gastrointestinal: positive for right groin bulge, back pain Musculoskeletal: positive for back pain  Blood pressure (!) 144/88, pulse 75, temperature 98.1 F (36.7 C), temperature source Oral, resp. rate 14, height 5' 11"  (1.803 m), weight 191 lb 3.2 oz (86.7 kg), SpO2 96 %. Physical Exam Vitals reviewed.  Constitutional:      Appearance: Normal appearance.  HENT:     Head: Normocephalic.     Nose: Nose normal.  Eyes:     Extraocular Movements: Extraocular movements intact.  Cardiovascular:     Rate and Rhythm: Normal rate and regular rhythm.  Pulmonary:     Effort: Pulmonary effort is normal.     Breath sounds: Normal breath sounds.  Abdominal:     General: There is no distension.     Palpations: Abdomen is soft.     Tenderness: There is no abdominal tenderness.     Hernia: A hernia is present. Hernia is present in the right inguinal area. There is no hernia in the left inguinal area.     Comments: Reducible, nontender   Musculoskeletal:        General: No swelling. Normal range of motion.     Cervical back: Normal range of motion.  Skin:    General: Skin is warm.  Neurological:     General: No focal deficit present.     Mental Status: He is alert and oriented to person, place, and time.  Psychiatric:        Mood and Affect: Mood normal.        Behavior: Behavior normal.        Thought Content: Thought content normal.        Judgment: Judgment normal.    Results: ECHO 12/2016 Study Conclusions   - Left ventricle: The cavity size was normal. Wall thickness was    increased in a pattern of mild LVH. Systolic function was normal.    The estimated ejection fraction was in the range of 50% to 55%.    Although no diagnostic regional wall motion abnormality was    identified, this possibility cannot be completely  excluded on the    basis of this study. Left ventricular diastolic function    parameters were  normal.  - Aortic valve: Mildly calcified annulus. Trileaflet.  - Mitral valve: Mildly thickened leaflets . Systolic bowing without    prolapse. There was trivial regurgitation.  - Right atrium: Central venous pressure (est): 3 mm Hg.  - Atrial septum: No defect or patent foramen ovale was identified.  - Tricuspid valve: There was trivial regurgitation.  - Pulmonary arteries: PA peak pressure: 14 mm Hg (S).  - Pericardium, extracardiac: There was no pericardial effusion.   Impressions:   - Mild LVH with LVEF approximately 50-55% and normal diastolic    function. Mildly calcified aortic annulus. Mildly thickened    mitral leaflets with systolic bowing and trivial mitral    regurgitation. Trivial tricuspid regurgitation with normal    estimated PASP.    Assessment & Plan:  Shannon Wilson is a 50 y.o. male with right inguinal hernia that is symptomatic and causing him discomfort. He has a prior history of MI, TIA and vasculitis but says he went to multiple specialist in [place] and they could not explain what he had going on. He did she Dr. Johnsie Cancel in 2018 with ECHO, EKG and carotid dopplers all of which were negative. He denies any chest pain or SOB but is not extremely active due to back pain. Discussed with him risk of surgery and being put to sleep and having some cardiac issue given his prior history. Discussed that we would get a preop EKG and if any changes he would need to see cardiology again. As of now he is stable without any chest pain or SOB.   -Discussed the risk and benefits including, bleeding, infection, use of mesh, risk of recurrence, risk of nerve damage causing numbness or changes in sensation, risk of damage to the cord structures. The patient understands the risk and benefits of repair with mesh, and has decided to proceed.  We also discussed open versus laparoscopic surgery and  the use of mesh. We discussed that I do open repairs with mesh, and that this is considered equivalent to laparoscopic surgery. We discussed reasons for opting for laparoscopic surgery including if a bilateral repair is needed or if a patient has a recurrence after an open repair.   All questions were answered to the satisfaction of the patient and family.   Virl Cagey 10/12/2020, 12:12 PM

## 2020-10-12 NOTE — Patient Instructions (Signed)
Shannon Wilson  10/12/2020     @PREFPERIOPPHARMACY @   Your procedure is scheduled on  10/18/2020.   Report to [facility] at  0900 A.M.   Call this number if you have problems the morning of surgery:  [phone]   Remember:  Do not eat or drink after midnight.      Take these medicines the morning of surgery with A SIP OF WATER    amlodipine, gabapentin, skelaxin (if needed), reglan (if needed), zofran (if needed).     Do not wear jewelry, make-up or nail polish.  Do not wear lotions, powders, or perfumes, or deodorant.  Do not shave 48 hours prior to surgery.  Men may shave face and neck.  Do not bring valuables to the hospital.  [facility] is not responsible for any belongings or valuables.  Contacts, dentures or bridgework may not be worn into surgery.  Leave your suitcase in the car.  After surgery it may be brought to your room.  For patients admitted to the hospital, discharge time will be determined by your treatment team.  Patients discharged the day of surgery will not be allowed to drive home and must have someone with them for 24 hours.    Special instructions:   DO NOT smoke tobacco or vape for 24 hours before your procedure.  Please read over the following fact sheets that you were given. Anesthesia Post-op Instructions and Care and Recovery After Surgery      Open Hernia Repair, Adult, Care After What can I expect after the procedure? After the procedure, it is common to have: Mild discomfort. Slight bruising. Mild swelling. Pain in the belly (abdomen). A small amount of blood from the cut from surgery (incision). Follow these instructions at home: Your doctor may give you more specific instructions. If you have problems, callyour doctor. Medicines Take over-the-counter and prescription medicines only as told by your doctor. If told, take steps to prevent problems with pooping (constipation). You may need to: Drink enough fluid to keep  your pee (urine) pale yellow. Take medicines. You will be told what medicines to take. Eat foods that are high in fiber. These include beans, whole grains, and fresh fruits and vegetables. Limit foods that are high in fat and sugar. These include fried or sweet foods. Ask your doctor if you should avoid driving or using machines while you are taking your medicine. Incision care  Follow instructions from your doctor about how to take care of your incision. Make sure you: Wash your hands with soap and water for at least 20 seconds before and after you change your bandage (dressing). If you cannot use soap and water, use hand sanitizer. Change your bandage. Leave stitches or skin glue in place for at least 2 weeks. Leave tape strips alone unless you are told to take them off. You may trim the edges of the tape strips if they curl up. Check your incision every day for signs of infection. Check for: More redness, swelling, or pain. More fluid or blood. Warmth. Pus or a bad smell. Wear loose, soft clothing while your incision heals.  Activity  Rest as told by your doctor. Do not lift anything that is heavier than 10 lb (4.5 kg), or the limit that you are told. Do not play contact sports until your doctor says that this is safe. If you were given a sedative during your procedure, do not drive or use machines until  your doctor says that it is safe. A sedative is a medicine that helps you relax. Return to your normal activities when your doctor says that it is safe.  General instructions Do not take baths, swim, or use a hot tub. Ask your doctor about taking showers or sponge baths. Hold a pillow over your belly when you cough or sneeze. This helps with pain. Do not smoke or use any products that contain nicotine or tobacco. If you need help quitting, ask your doctor. Keep all follow-up visits. Contact a doctor if: You have any of these signs of infection in or around your incision: More  redness, swelling, or pain. More fluid or blood. Warmth. Pus. A bad smell. You have a fever or chills. You have blood in your poop (stool). You have not pooped (had a bowel movement) in 2-3 days. Medicine does not help your pain. Get help right away if: You have chest pain, or you are short of breath. You feel faint or light-headed. You have very bad pain. You vomit and your pain is worse. You have pain, swelling, or redness in a leg. These symptoms may be an emergency. Get help right away. Call your local emergency services (911 in the [place] Do not wait to see if the symptoms will go away. Do not drive yourself to the hospital. Summary After this procedure, it is common to have mild discomfort, slight bruising, and mild swelling. Follow instructions from your doctor about how to take care of your cut from surgery (incision). Check every day for signs of infection. Do not lift heavy objects or play contact sports until your doctor says it is safe. Return to your normal activities as told by your doctor. This information is not intended to replace advice given to you by your health care provider. Make sure you discuss any questions you have with your healthcare provider. Document Revised: 11/10/2019 Document Reviewed: 11/10/2019 Elsevier Patient Education  2022 [place] Anesthesia, Adult, Care After This sheet gives you information about how to care for yourself after your procedure. Your health care provider may also give you more specific instructions. If you have problems or questions, contact your health careprovider. What can I expect after the procedure? After the procedure, the following side effects are common: Pain or discomfort at the IV site. Nausea. Vomiting. Sore throat. Trouble concentrating. Feeling cold or chills. Feeling weak or tired. Sleepiness and fatigue. Soreness and body aches. These side effects can affect parts of the body that were not  involved in surgery. Follow these instructions at home: For the time period you were told by your health care provider:  Rest. Do not participate in activities where you could fall or become injured. Do not drive or use machinery. Do not drink alcohol. Do not take sleeping pills or medicines that cause drowsiness. Do not make important decisions or sign legal documents. Do not take care of children on your own.  Eating and drinking Follow any instructions from your health care provider about eating or drinking restrictions. When you feel hungry, start by eating small amounts of foods that are soft and easy to digest (bland), such as toast. Gradually return to your regular diet. Drink enough fluid to keep your urine pale yellow. If you vomit, rehydrate by drinking water, juice, or clear broth. General instructions If you have sleep apnea, surgery and certain medicines can increase your risk for breathing problems. Follow instructions from your health care provider about wearing your sleep device:  Anytime you are sleeping, including during daytime naps. While taking prescription pain medicines, sleeping medicines, or medicines that make you drowsy. Have a responsible adult stay with you for the time you are told. It is important to have someone help care for you until you are awake and alert. Return to your normal activities as told by your health care provider. Ask your health care provider what activities are safe for you. Take over-the-counter and prescription medicines only as told by your health care provider. If you smoke, do not smoke without supervision. Keep all follow-up visits as told by your health care provider. This is important. Contact a health care provider if: You have nausea or vomiting that does not get better with medicine. You cannot eat or drink without vomiting. You have pain that does not get better with medicine. You are unable to pass urine. You develop a skin  rash. You have a fever. You have redness around your IV site that gets worse. Get help right away if: You have difficulty breathing. You have chest pain. You have blood in your urine or stool, or you vomit blood. Summary After the procedure, it is common to have a sore throat or nausea. It is also common to feel tired. Have a responsible adult stay with you for the time you are told. It is important to have someone help care for you until you are awake and alert. When you feel hungry, start by eating small amounts of foods that are soft and easy to digest (bland), such as toast. Gradually return to your regular diet. Drink enough fluid to keep your urine pale yellow. Return to your normal activities as told by your health care provider. Ask your health care provider what activities are safe for you. This information is not intended to replace advice given to you by your health care provider. Make sure you discuss any questions you have with your healthcare provider. Document Revised: 12/11/2019 Document Reviewed: 07/10/2019 Elsevier Patient Education  2022 [place]. How to Use Chlorhexidine for Bathing Chlorhexidine gluconate (CHG) is a germ-killing (antiseptic) solution that is used to clean the skin. It can get rid of the bacteria that normally live on the skin and can keep them away for about 24 hours. To clean your skin with CHG, you may be given: A CHG solution to use in the shower or as part of a sponge bath. A prepackaged cloth that contains CHG. Cleaning your skin with CHG may help lower the risk for infection: While you are staying in the intensive care unit of the hospital. If you have a vascular access, such as a central line, to provide short-term or long-term access to your veins. If you have a catheter to drain urine from your bladder. If you are on a ventilator. A ventilator is a machine that helps you breathe by moving air in and out of your lungs. After surgery. What  are the risks? Risks of using CHG include: A skin reaction. Hearing loss, if CHG gets in your ears. Eye injury, if CHG gets in your eyes and is not rinsed out. The CHG product catching fire. Make sure that you avoid smoking and flames after applying CHG to your skin. Do not use CHG: If you have a chlorhexidine allergy or have previously reacted to chlorhexidine. On babies younger than 59 months of age. How to use CHG solution Use CHG only as told by your health care provider, and follow the instructions on the label. Use  the full amount of CHG as directed. Usually, this is one bottle. During a shower Follow these steps when using CHG solution during a shower (unless your health care provider gives you different instructions): Start the shower. Use your normal soap and shampoo to wash your face and hair. Turn off the shower or move out of the shower stream. Pour the CHG onto a clean washcloth. Do not use any type of brush or rough-edged sponge. Starting at your neck, lather your body down to your toes. Make sure you follow these instructions: If you will be having surgery, pay special attention to the part of your body where you will be having surgery. Scrub this area for at least 1 minute. Do not use CHG on your head or face. If the solution gets into your ears or eyes, rinse them well with water. Avoid your genital area. Avoid any areas of skin that have broken skin, cuts, or scrapes. Scrub your back and under your arms. Make sure to wash skin folds. Let the lather sit on your skin for 1-2 minutes or as long as told by your health care provider. Thoroughly rinse your entire body in the shower. Make sure that all body creases and crevices are rinsed well. Dry off with a clean towel. Do not put any substances on your body afterward--such as powder, lotion, or perfume--unless you are told to do so by your health care provider. Only use lotions that are recommended by the manufacturer. Put on  clean clothes or pajamas. If it is the night before your surgery, sleep in clean sheets.  During a sponge bath Follow these steps when using CHG solution during a sponge bath (unless your health care provider gives you different instructions): Use your normal soap and shampoo to wash your face and hair. Pour the CHG onto a clean washcloth. Starting at your neck, lather your body down to your toes. Make sure you follow these instructions: If you will be having surgery, pay special attention to the part of your body where you will be having surgery. Scrub this area for at least 1 minute. Do not use CHG on your head or face. If the solution gets into your ears or eyes, rinse them well with water. Avoid your genital area. Avoid any areas of skin that have broken skin, cuts, or scrapes. Scrub your back and under your arms. Make sure to wash skin folds. Let the lather sit on your skin for 1-2 minutes or as long as told by your health care provider. Using a different clean, wet washcloth, thoroughly rinse your entire body. Make sure that all body creases and crevices are rinsed well. Dry off with a clean towel. Do not put any substances on your body afterward--such as powder, lotion, or perfume--unless you are told to do so by your health care provider. Only use lotions that are recommended by the manufacturer. Put on clean clothes or pajamas. If it is the night before your surgery, sleep in clean sheets. How to use CHG prepackaged cloths Only use CHG cloths as told by your health care provider, and follow the instructions on the label. Use the CHG cloth on clean, dry skin. Do not use the CHG cloth on your head or face unless your health care provider tells you to. When washing with the CHG cloth: Avoid your genital area. Avoid any areas of skin that have broken skin, cuts, or scrapes. Before surgery Follow these steps when using a CHG cloth to clean  before surgery (unless your health care  provider gives you different instructions): Using the CHG cloth, vigorously scrub the part of your body where you will be having surgery. Scrub using a back-and-forth motion for 3 minutes. The area on your body should be completely wet with CHG when you are done scrubbing. Do not rinse. Discard the cloth and let the area air-dry. Do not put any substances on the area afterward, such as powder, lotion, or perfume. Put on clean clothes or pajamas. If it is the night before your surgery, sleep in clean sheets.  For general bathing Follow these steps when using CHG cloths for general bathing (unless your health care provider gives you different instructions). Use a separate CHG cloth for each area of your body. Make sure you wash between any folds of skin and between your fingers and toes. Wash your body in the following order, switching to a new cloth after each step: The front of your neck, shoulders, and chest. Both of your arms, under your arms, and your hands. Your stomach and groin area, avoiding the genitals. Your right leg and foot. Your left leg and foot. The back of your neck, your back, and your buttocks. Do not rinse. Discard the cloth and let the area air-dry. Do not put any substances on your body afterward--such as powder, lotion, or perfume--unless you are told to do so by your health care provider. Only use lotions that are recommended by the manufacturer. Put on clean clothes or pajamas. Contact a health care provider if: Your skin gets irritated after scrubbing. You have questions about using your solution or cloth. Get help right away if: Your eyes become very red or swollen. Your eyes itch badly. Your skin itches badly and is red or swollen. Your hearing changes. You have trouble seeing. You have swelling or tingling in your mouth or throat. You have trouble breathing. You swallow any chlorhexidine. Summary Chlorhexidine gluconate (CHG) is a germ-killing (antiseptic)  solution that is used to clean the skin. Cleaning your skin with CHG may help to lower your risk for infection. You may be given CHG to use for bathing. It may be in a bottle or in a prepackaged cloth to use on your skin. Carefully follow your health care provider's instructions and the instructions on the product label. Do not use CHG if you have a chlorhexidine allergy. Contact your health care provider if your skin gets irritated after scrubbing. This information is not intended to replace advice given to you by your health care provider. Make sure you discuss any questions you have with your healthcare provider. Document Revised: 08/08/2019 Document Reviewed: 09/12/2019 Elsevier Patient Education  [place].

## 2020-10-12 NOTE — H&P (Signed)
[facility] History and Physical  Reason for Referral: Right inguinal hernia  Referring Physician: Celene Squibb, MD    Chief Complaint   New Patient (Initial Visit)     Shannon Wilson is a 50 y.o. male.  HPI: Shannon Wilson is a 50 yo on disability from his chronic back pain who has a history of vasculitis in 2017 and reported to have had a MI and TIA in 2014  when he lived in [place]. He says he was never put on plavix or had any cardiac catheterization or stents. He says that he saw multiple specialist and no one could really tell what was going on or why it happened. He has no chest pain or SOB and does work around the house but is limited due to his chronic pain. He says he can walk and has no chest pain or SOB. He has been having a worsening bulge in the right groin for several weeks now and it is getting larger and more painful.   He saw Dr. Johnsie Cancel in 2018 and had EKG and ECHO done that were reassuring. He did check Sedimentation rate and go through his prior records and reports that a rheumatology workup etc was completed.  The patient denies any active chest pain or SOB.    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    Past Surgical History:  Procedure Laterality Date   BACK SURGERY  08/2012   SPINE SURGERY  08/2012   discectomy    Family History  Problem Relation Age of Onset   Hypertension Mother    Heart disease Mother 9   Cancer Father        prostate   Sickle cell trait Father    Sickle cell trait Son    Cancer Maternal Grandmother        bone   Cancer Paternal Grandmother    Cancer Paternal Grandfather        liver   Learning disabilities Neg Hx     Social History    Tobacco Use   Smoking status: Never   Smokeless tobacco: Never  Vaping Use   Vaping Use: Never used  Substance Use Topics   Alcohol use: No   Drug use: No    Medications: I have reviewed the patient's current medications. Allergies as of 10/07/2020       Reactions   Hydrocodone-acetaminophen Nausea And Vomiting        Medication List        Accurate as of October 07, 2020 11:59 PM. If you have any questions, ask your nurse or doctor.          STOP taking these medications    diclofenac Sodium 1 % Gel Commonly known as: Voltaren Stopped by: Virl Cagey, MD   hydrochlorothiazide 12.5 MG tablet Commonly known as: HYDRODIURIL Stopped by: Virl Cagey, MD   HYDROcodone-acetaminophen 5-325 MG tablet Commonly known as: NORCO/VICODIN Stopped by: Virl Cagey, MD       TAKE these medications    acetaminophen 650 MG CR tablet Commonly known as: Tylenol 8 Hour Take 1 tablet (650 mg total) by mouth  every 8 (eight) hours.   amLODipine 10 MG tablet Commonly known as: NORVASC Take 1 tablet (10 mg total) by mouth daily.   aspirin EC 81 MG tablet Take 1 tablet (81 mg total) by mouth daily.   cyclobenzaprine 5 MG tablet Commonly known as: FLEXERIL Take 1 tablet (5 mg total) by mouth at bedtime.   gabapentin 300 MG capsule Commonly known as: NEURONTIN Take 300 mg by mouth 3 (three) times daily.   ibuprofen 600 MG tablet Commonly known as: ADVIL Take 1 tablet (600 mg total) by mouth every 6 (six) hours as needed.   lisinopril 40 MG tablet Commonly known as: ZESTRIL Take 1 tablet (40 mg total) by mouth daily.   metaxalone 800 MG tablet Commonly known as: SKELAXIN Take 1 tablet (800 mg total) by mouth 3 (three) times daily.   metoCLOPramide 10 MG tablet Commonly known as: REGLAN Take 1 tablet (10 mg total) by mouth every 6 (six) hours as needed for nausea (or headache).   ondansetron 8 MG disintegrating tablet Commonly known as: Zofran  ODT Take 1 tablet (8 mg total) by mouth every 8 (eight) hours as needed for nausea.         ROS:  A comprehensive review of systems was negative except for: Gastrointestinal: positive for right groin bulge, back pain Musculoskeletal: positive for back pain  Blood pressure (!) 144/88, pulse 75, temperature 98.1 F (36.7 C), temperature source Oral, resp. rate 14, height 5' 11"  (1.803 m), weight 191 lb 3.2 oz (86.7 kg), SpO2 96 %. Physical Exam Vitals reviewed.  Constitutional:      Appearance: Normal appearance.  HENT:     Head: Normocephalic.     Nose: Nose normal.  Eyes:     Extraocular Movements: Extraocular movements intact.  Cardiovascular:     Rate and Rhythm: Normal rate and regular rhythm.  Pulmonary:     Effort: Pulmonary effort is normal.     Breath sounds: Normal breath sounds.  Abdominal:     General: There is no distension.     Palpations: Abdomen is soft.     Tenderness: There is no abdominal tenderness.     Hernia: A hernia is present. Hernia is present in the right inguinal area. There is no hernia in the left inguinal area.     Comments: Reducible, nontender   Musculoskeletal:        General: No swelling. Normal range of motion.     Cervical back: Normal range of motion.  Skin:    General: Skin is warm.  Neurological:     General: No focal deficit present.     Mental Status: He is alert and oriented to person, place, and time.  Psychiatric:        Mood and Affect: Mood normal.        Behavior: Behavior normal.        Thought Content: Thought content normal.        Judgment: Judgment normal.    Results: ECHO 12/2016 Study Conclusions   - Left ventricle: The cavity size was normal. Wall thickness was    increased in a pattern of mild LVH. Systolic function was normal.    The estimated ejection fraction was in the range of 50% to 55%.    Although no diagnostic regional wall motion abnormality was    identified, this possibility cannot be completely  excluded on the    basis of this study. Left ventricular diastolic function    parameters were  normal.  - Aortic valve: Mildly calcified annulus. Trileaflet.  - Mitral valve: Mildly thickened leaflets . Systolic bowing without    prolapse. There was trivial regurgitation.  - Right atrium: Central venous pressure (est): 3 mm Hg.  - Atrial septum: No defect or patent foramen ovale was identified.  - Tricuspid valve: There was trivial regurgitation.  - Pulmonary arteries: PA peak pressure: 14 mm Hg (S).  - Pericardium, extracardiac: There was no pericardial effusion.   Impressions:   - Mild LVH with LVEF approximately 50-55% and normal diastolic    function. Mildly calcified aortic annulus. Mildly thickened    mitral leaflets with systolic bowing and trivial mitral    regurgitation. Trivial tricuspid regurgitation with normal    estimated PASP.    Assessment & Plan:  Saabir Blyth is a 50 y.o. male with right inguinal hernia that is symptomatic and causing him discomfort. He has a prior history of MI, TIA and vasculitis but says he went to multiple specialist in [place] and they could not explain what he had going on. He did she Dr. Johnsie Cancel in 2018 with ECHO, EKG and carotid dopplers all of which were negative. He denies any chest pain or SOB but is not extremely active due to back pain. Discussed with him risk of surgery and being put to sleep and having some cardiac issue given his prior history. Discussed that we would get a preop EKG and if any changes he would need to see cardiology again. As of now he is stable without any chest pain or SOB.   -Discussed the risk and benefits including, bleeding, infection, use of mesh, risk of recurrence, risk of nerve damage causing numbness or changes in sensation, risk of damage to the cord structures. The patient understands the risk and benefits of repair with mesh, and has decided to proceed.  We also discussed open versus laparoscopic surgery and  the use of mesh. We discussed that I do open repairs with mesh, and that this is considered equivalent to laparoscopic surgery. We discussed reasons for opting for laparoscopic surgery including if a bilateral repair is needed or if a patient has a recurrence after an open repair.   All questions were answered to the satisfaction of the patient and family.   Virl Cagey 10/12/2020, 12:12 PM

## 2020-10-14 ENCOUNTER — Encounter (HOSPITAL_COMMUNITY): Payer: Self-pay

## 2020-10-14 ENCOUNTER — Other Ambulatory Visit: Payer: Self-pay

## 2020-10-14 ENCOUNTER — Encounter (HOSPITAL_COMMUNITY)
Admission: RE | Admit: 2020-10-14 | Discharge: 2020-10-14 | Disposition: A | Discharge status: Home / Self Care | Payer: Medicare Other | Source: Ambulatory Visit | Attending: General Surgery | Admitting: General Surgery

## 2020-10-14 DIAGNOSIS — Z01818 Encounter for other preprocedural examination: Secondary | ICD-10-CM | POA: Diagnosis not present

## 2020-10-14 LAB — CBC WITH DIFFERENTIAL/PLATELET
Abs Immature Granulocytes: 0.02 10*3/uL (ref 0.00–0.07)
Basophils Absolute: 0.1 10*3/uL (ref 0.0–0.1)
Basophils Relative: 1 %
Eosinophils Absolute: 0.3 10*3/uL (ref 0.0–0.5)
Eosinophils Relative: 3 %
HCT: 41.9 % (ref 39.0–52.0)
Hemoglobin: 14.7 g/dL (ref 13.0–17.0)
Immature Granulocytes: 0 %
Lymphocytes Relative: 42 %
Lymphs Abs: 3.3 10*3/uL (ref 0.7–4.0)
MCH: 30.6 pg (ref 26.0–34.0)
MCHC: 35.1 g/dL (ref 30.0–36.0)
MCV: 87.1 fL (ref 80.0–100.0)
Monocytes Absolute: 0.7 10*3/uL (ref 0.1–1.0)
Monocytes Relative: 9 %
Neutro Abs: 3.5 10*3/uL (ref 1.7–7.7)
Neutrophils Relative %: 45 %
Platelets: 304 10*3/uL (ref 150–400)
RBC: 4.81 MIL/uL (ref 4.22–5.81)
RDW: 12.5 % (ref 11.5–15.5)
WBC: 7.8 10*3/uL (ref 4.0–10.5)
nRBC: 0 % (ref 0.0–0.2)

## 2020-10-18 ENCOUNTER — Ambulatory Visit (HOSPITAL_COMMUNITY): Payer: Medicare Other | Admitting: Certified Registered"

## 2020-10-18 ENCOUNTER — Ambulatory Visit (HOSPITAL_COMMUNITY)
Admission: RE | Admit: 2020-10-18 | Discharge: 2020-10-18 | Disposition: A | Discharge status: Home / Self Care | Payer: Medicare Other | Attending: General Surgery | Admitting: General Surgery

## 2020-10-18 ENCOUNTER — Encounter (HOSPITAL_COMMUNITY): Payer: Self-pay | Admitting: General Surgery

## 2020-10-18 ENCOUNTER — Other Ambulatory Visit: Payer: Self-pay

## 2020-10-18 ENCOUNTER — Encounter (HOSPITAL_COMMUNITY)
Admission: RE | Disposition: A | Discharge status: Home / Self Care | Payer: Self-pay | Source: Home / Self Care | Attending: General Surgery

## 2020-10-18 DIAGNOSIS — K4090 Unilateral inguinal hernia, without obstruction or gangrene, not specified as recurrent: Secondary | ICD-10-CM | POA: Diagnosis not present

## 2020-10-18 DIAGNOSIS — G8929 Other chronic pain: Secondary | ICD-10-CM | POA: Insufficient documentation

## 2020-10-18 DIAGNOSIS — Z79899 Other long term (current) drug therapy: Secondary | ICD-10-CM | POA: Insufficient documentation

## 2020-10-18 DIAGNOSIS — I10 Essential (primary) hypertension: Secondary | ICD-10-CM | POA: Diagnosis not present

## 2020-10-18 DIAGNOSIS — I2510 Atherosclerotic heart disease of native coronary artery without angina pectoris: Secondary | ICD-10-CM | POA: Diagnosis not present

## 2020-10-18 DIAGNOSIS — Z7982 Long term (current) use of aspirin: Secondary | ICD-10-CM | POA: Insufficient documentation

## 2020-10-18 DIAGNOSIS — Z885 Allergy status to narcotic agent status: Secondary | ICD-10-CM | POA: Diagnosis not present

## 2020-10-18 DIAGNOSIS — Z8673 Personal history of transient ischemic attack (TIA), and cerebral infarction without residual deficits: Secondary | ICD-10-CM | POA: Diagnosis not present

## 2020-10-18 DIAGNOSIS — I252 Old myocardial infarction: Secondary | ICD-10-CM | POA: Diagnosis not present

## 2020-10-18 HISTORY — PX: INGUINAL HERNIA REPAIR: SHX194

## 2020-10-18 SURGERY — REPAIR, HERNIA, INGUINAL, ADULT
Anesthesia: General | Site: Inguinal | Laterality: Right

## 2020-10-18 MED ORDER — ONDANSETRON HCL 4 MG/2ML IJ SOLN
4.0000 mg | Freq: Once | INTRAMUSCULAR | Status: DC | PRN
Start: 2020-10-18 — End: 2020-10-18
  Filled 2020-10-18: qty 2

## 2020-10-18 MED ORDER — ONDANSETRON HCL 4 MG/2ML IJ SOLN
INTRAMUSCULAR | Status: AC
  Filled 2020-10-18: qty 2

## 2020-10-18 MED ORDER — CHLORHEXIDINE GLUCONATE CLOTH 2 % EX PADS: 6.0000 | MEDICATED_PAD | Freq: Once | CUTANEOUS | Status: DC

## 2020-10-18 MED ORDER — SUGAMMADEX SODIUM 200 MG/2ML IV SOLN
INTRAVENOUS | Status: DC | PRN
  Administered 2020-10-18: 200 mg via INTRAVENOUS

## 2020-10-18 MED ORDER — LACTATED RINGERS IV SOLN: INTRAVENOUS | Status: DC

## 2020-10-18 MED ORDER — KETAMINE HCL 10 MG/ML IJ SOLN
INTRAMUSCULAR | Status: DC | PRN
  Administered 2020-10-18: 20 mg via INTRAVENOUS

## 2020-10-18 MED ORDER — PROPOFOL 10 MG/ML IV BOLUS
INTRAVENOUS | Status: DC | PRN
  Administered 2020-10-18: 200 mg via INTRAVENOUS

## 2020-10-18 MED ORDER — ORAL CARE MOUTH RINSE: 15.0000 mL | Freq: Once | OROMUCOSAL | Status: AC

## 2020-10-18 MED ORDER — DEXAMETHASONE SODIUM PHOSPHATE 4 MG/ML IJ SOLN
INTRAMUSCULAR | Status: DC | PRN
  Administered 2020-10-18: 4 mg via INTRAVENOUS

## 2020-10-18 MED ORDER — FENTANYL CITRATE (PF) 250 MCG/5ML IJ SOLN
INTRAMUSCULAR | Status: AC
  Filled 2020-10-18: qty 5

## 2020-10-18 MED ORDER — BUPIVACAINE LIPOSOME 1.3 % IJ SUSP
INTRAMUSCULAR | Status: AC
  Filled 2020-10-18: qty 20

## 2020-10-18 MED ORDER — PROPOFOL 10 MG/ML IV BOLUS
INTRAVENOUS | Status: AC
  Filled 2020-10-18: qty 40

## 2020-10-18 MED ORDER — TRAMADOL HCL 50 MG PO TABS: 50.0000 mg | ORAL_TABLET | Freq: Four times a day (QID) | ORAL | 0 refills | Status: DC | PRN

## 2020-10-18 MED ORDER — SODIUM CHLORIDE 0.9 % IR SOLN
Status: DC | PRN
  Administered 2020-10-18: 1000 mL

## 2020-10-18 MED ORDER — SUCCINYLCHOLINE CHLORIDE 200 MG/10ML IV SOSY
PREFILLED_SYRINGE | INTRAVENOUS | Status: AC
  Filled 2020-10-18: qty 10

## 2020-10-18 MED ORDER — BUPIVACAINE LIPOSOME 1.3 % IJ SUSP
INTRAMUSCULAR | Status: DC | PRN
  Administered 2020-10-18: 20 mL

## 2020-10-18 MED ORDER — ASPIRIN EC 81 MG PO TBEC: 81.0000 mg | DELAYED_RELEASE_TABLET | ORAL | Status: DC

## 2020-10-18 MED ORDER — DEXAMETHASONE SODIUM PHOSPHATE 10 MG/ML IJ SOLN
INTRAMUSCULAR | Status: AC
  Filled 2020-10-18: qty 1

## 2020-10-18 MED ORDER — SODIUM CHLORIDE FLUSH 0.9 % IV SOLN
INTRAVENOUS | Status: AC
  Filled 2020-10-18: qty 10

## 2020-10-18 MED ORDER — SODIUM CHLORIDE 0.9% FLUSH
10.0000 mL | Freq: Once | INTRAVENOUS | Status: AC
  Administered 2020-10-18: 10 mL via INTRAVENOUS

## 2020-10-18 MED ORDER — MIDAZOLAM HCL 5 MG/5ML IJ SOLN
INTRAMUSCULAR | Status: DC | PRN
  Administered 2020-10-18: 2 mg via INTRAVENOUS

## 2020-10-18 MED ORDER — CEFAZOLIN SODIUM-DEXTROSE 2-4 GM/100ML-% IV SOLN
2.0000 g | INTRAVENOUS | Status: AC
  Administered 2020-10-18: 2 g via INTRAVENOUS
  Filled 2020-10-18: qty 100

## 2020-10-18 MED ORDER — ONDANSETRON HCL 4 MG/2ML IJ SOLN
INTRAMUSCULAR | Status: DC | PRN
  Administered 2020-10-18: 4 mg via INTRAVENOUS

## 2020-10-18 MED ORDER — FENTANYL CITRATE (PF) 100 MCG/2ML IJ SOLN
INTRAMUSCULAR | Status: DC | PRN
  Administered 2020-10-18: 50 ug via INTRAVENOUS
  Administered 2020-10-18: 150 ug via INTRAVENOUS
  Administered 2020-10-18: 50 ug via INTRAVENOUS

## 2020-10-18 MED ORDER — ONDANSETRON HCL 4 MG PO TABS: 4.0000 mg | ORAL_TABLET | Freq: Three times a day (TID) | ORAL | 1 refills | Status: DC | PRN

## 2020-10-18 MED ORDER — KETAMINE HCL 50 MG/5ML IJ SOSY
PREFILLED_SYRINGE | INTRAMUSCULAR | Status: AC
  Filled 2020-10-18: qty 5

## 2020-10-18 MED ORDER — LIDOCAINE HCL (PF) 2 % IJ SOLN
INTRAMUSCULAR | Status: AC
  Filled 2020-10-18: qty 5

## 2020-10-18 MED ORDER — ROCURONIUM BROMIDE 10 MG/ML (PF) SYRINGE
PREFILLED_SYRINGE | INTRAVENOUS | Status: DC | PRN
  Administered 2020-10-18: 40 mg via INTRAVENOUS

## 2020-10-18 MED ORDER — HYDROMORPHONE HCL 1 MG/ML IJ SOLN: 0.2500 mg | INTRAMUSCULAR | Status: DC | PRN

## 2020-10-18 MED ORDER — MIDAZOLAM HCL 2 MG/2ML IJ SOLN
INTRAMUSCULAR | Status: AC
  Filled 2020-10-18: qty 2

## 2020-10-18 MED ORDER — ROCURONIUM BROMIDE 10 MG/ML (PF) SYRINGE
PREFILLED_SYRINGE | INTRAVENOUS | Status: AC
  Filled 2020-10-18: qty 10

## 2020-10-18 MED ORDER — LIDOCAINE 2% (20 MG/ML) 5 ML SYRINGE
INTRAMUSCULAR | Status: DC | PRN
  Administered 2020-10-18: 100 mg via INTRAVENOUS

## 2020-10-18 MED ORDER — CHLORHEXIDINE GLUCONATE 0.12 % MT SOLN
15.0000 mL | Freq: Once | OROMUCOSAL | Status: AC
  Administered 2020-10-18: 15 mL via OROMUCOSAL

## 2020-10-18 MED ORDER — SUCCINYLCHOLINE CHLORIDE 20 MG/ML IJ SOLN
INTRAMUSCULAR | Status: DC | PRN
  Administered 2020-10-18: 140 mg via INTRAVENOUS

## 2020-10-18 MED ORDER — ONDANSETRON HCL 4 MG/2ML IJ SOLN
4.0000 mg | Freq: Once | INTRAMUSCULAR | Status: AC
  Administered 2020-10-18: 4 mg via INTRAVENOUS

## 2020-10-18 SURGICAL SUPPLY — 37 items
ADH SKN CLS APL DERMABOND .7 (GAUZE/BANDAGES/DRESSINGS) ×1
CLOTH BEACON ORANGE TIMEOUT ST (SAFETY) ×2 IMPLANT
COVER LIGHT HANDLE STERIS (MISCELLANEOUS) ×4 IMPLANT
DERMABOND ADVANCED (GAUZE/BANDAGES/DRESSINGS) ×1
DERMABOND ADVANCED .7 DNX12 (GAUZE/BANDAGES/DRESSINGS) ×1 IMPLANT
DRAIN PENROSE 0.5X18 (DRAIN) ×2 IMPLANT
ELECT REM PT RETURN 9FT ADLT (ELECTROSURGICAL) ×2
ELECTRODE REM PT RTRN 9FT ADLT (ELECTROSURGICAL) ×1 IMPLANT
GAUZE 4X4 16PLY ~~LOC~~+RFID DBL (SPONGE) ×2 IMPLANT
GAUZE SPONGE 4X4 12PLY STRL (GAUZE/BANDAGES/DRESSINGS) ×2 IMPLANT
GLOVE SRG 8 PF TXTR STRL LF DI (GLOVE) ×2 IMPLANT
GLOVE SURG ENC MOIS LTX SZ6.5 (GLOVE) ×2 IMPLANT
GLOVE SURG UNDER POLY LF SZ6.5 (GLOVE) ×2 IMPLANT
GLOVE SURG UNDER POLY LF SZ7 (GLOVE) ×6 IMPLANT
GLOVE SURG UNDER POLY LF SZ8 (GLOVE) ×4
GOWN STRL REUS W/TWL LRG LVL3 (GOWN DISPOSABLE) ×6 IMPLANT
GOWN STRL REUS W/TWL XL LVL3 (GOWN DISPOSABLE) ×2 IMPLANT
INST SET MINOR GENERAL (KITS) ×2 IMPLANT
KIT TURNOVER KIT A (KITS) ×2 IMPLANT
MANIFOLD NEPTUNE II (INSTRUMENTS) ×2 IMPLANT
MESH MARLEX PLUG MEDIUM (Mesh General) ×2 IMPLANT
NEEDLE HYPO 18GX1.5 BLUNT FILL (NEEDLE) ×2 IMPLANT
NEEDLE HYPO 21X1.5 SAFETY (NEEDLE) ×2 IMPLANT
NS IRRIG 1000ML POUR BTL (IV SOLUTION) ×2 IMPLANT
PACK MINOR (CUSTOM PROCEDURE TRAY) ×2 IMPLANT
PAD ARMBOARD 7.5X6 YLW CONV (MISCELLANEOUS) ×2 IMPLANT
PENCIL SMOKE EVACUATOR (MISCELLANEOUS) ×2 IMPLANT
SET BASIN LINEN APH (SET/KITS/TRAYS/PACK) ×2 IMPLANT
SOL PREP PROV IODINE SCRUB 4OZ (MISCELLANEOUS) ×2 IMPLANT
SUT MNCRL AB 4-0 PS2 18 (SUTURE) ×2 IMPLANT
SUT NOVA NAB GS-22 2 2-0 T-19 (SUTURE) ×6 IMPLANT
SUT VIC AB 2-0 CT1 27 (SUTURE) ×2
SUT VIC AB 2-0 CT1 TAPERPNT 27 (SUTURE) ×1 IMPLANT
SUT VIC AB 3-0 SH 27 (SUTURE) ×2
SUT VIC AB 3-0 SH 27X BRD (SUTURE) ×1 IMPLANT
SYR 20ML LL LF (SYRINGE) ×2 IMPLANT
SYR 30ML LL (SYRINGE) ×2 IMPLANT

## 2020-10-18 NOTE — Interval H&P Note (Signed)
History and Physical Interval Note:  10/18/2020 7:22 AM  Shannon Wilson  has presented today for surgery, with the diagnosis of Right Inguinal hernia.  The various methods of treatment have been discussed with the patient and family. After consideration of risks, benefits and other options for treatment, the patient has consented to  Procedure(s) with comments: HERNIA REPAIR INGUINAL ADULT W/MESH (Right) - pt knows to arrive at 6:15 as a surgical intervention.  The patient's history has been reviewed, patient examined, no change in status, stable for surgery.  I have reviewed the patient's chart and labs.  Questions were answered to the patient's satisfaction.    No changes. Virl Cagey

## 2020-10-18 NOTE — Op Note (Signed)
[facility] Operative Note  10/18/20  Preoperative Diagnosis: Right inguinal hernia    Postoperative Diagnosis: Same   Procedure(s) Performed: Right inguinal hernia repair with mesh   Surgeon: Lanell Matar. Constance Haw, MD   Assistants: No qualified resident was available   Anesthesia: General endotracheal   Anesthesiologist: Dr. Briant Cedar, MD    Specimens: None   Estimated Blood Loss: Minimal   Blood Replacement: None    Complications: None   Wound Class: Clean    Operative Indications: Shannon Wilson is a 50 yo with a right inguinal hernia that had been getting larger and causing him some discomfort. We discussed repair and risk of bleeding, infection, recurrence, use of mesh.   Findings: Indirect ingunial hernia   Procedure: The patient was taken to the operating room and placed supine. General endotracheal anesthesia was induced. Intravenous antibiotics were  administered per protocol.  A time out was preformed verifying the correct patient, procedure, site, positioning and implants.  The right groin and scrotum were prepared and draped in the usual sterile fashion.   An incision was made in a natural skin crease between the pubic tubercle and the anterior superior iliac spine.  The incision was deepened with electrocautery through Scarpa's and Camper's fascia until the aponeurosis of the external oblique was encountered.  This was cleaned and the external ring was exposed.  An incision was made in the midportion of the external oblique aponeurosis in the direction of its fibers. The ilioinguinal nerve was identified and was protected throughout the dissection.  Flaps of the external oblique were developed cephalad and inferiorly.    The cord was identified and it was gently dissented free at the pubic tubercle and encircled with a Penrose drain.  Attention was then directed at the anteromedial aspect of the cord, where an indirect hernia sac was identified.  The sac was  carefully dissected free from the cord down to the level of the internal ring.  The vas and testicular vessels were identified and protected from harm.  Once the sac was dissected free from the cords, the Penrose was placed around the cord which was retracted inferiorly out of the field of view.  The hernia was reduced into the internal ring without difficulty.  A medium Perfix Plug was placed into the defect and filled the space and was secured with  2-0 Novafil.  Attention was then turned to the floor of the canal, which was grossly weakened without any defined defect or sac.  The Perfix Mesh Patch was sutured to the inguinal ligament inferiorly starting at the pubic tubercle using 2-0 Novafil interrupted sutures.  The mesh was sutured superiorly to the conjoint tendon using 2-0 Novafil interrupted sutures.  Care was taken to ensure the mesh was placed in a relaxed fashion to avoid excessive tension and no neurovascular structures were caught in the repair.  Laterally the tails of the mesh were crossed and the internal ring was recreated, allowing for passage of cords without tension.   Hemostasis was adequate.  The Penrose was removed.  The external oblique aponeurosis was closed with a 2-0 Vicryl suture in a running fashion, taking care to not catch the ilioinguinal nerve in the suture line.  Scarpa's fashion was closed with 3-0 Vicryl interrupted sutures. The skin was closed with a subcuticular 4-0 Monocryl suture.  Dermabond was applied.   The testis was gently pulled down into its anatomic position in the scrotum.  The patient tolerated the procedure well and was taken  to the PACU in stable condition. All counts were correct at the end of the case.        Shannon Labrum, MD [facility] [address] [place], [place] [address] [phone] (office)

## 2020-10-18 NOTE — Transfer of Care (Signed)
Immediate Anesthesia Transfer of Care Note  Patient: Shannon Wilson  Procedure(s) Performed: HERNIA REPAIR INGUINAL ADULT W/MESH (Right: Inguinal)  Patient Location: PACU  Anesthesia Type:General  Level of Consciousness: awake, alert , oriented and patient cooperative  Airway & Oxygen Therapy: Patient Spontanous Breathing and Patient connected to nasal cannula oxygen  Post-op Assessment: Report given to RN, Post -op Vital signs reviewed and stable and Patient moving all extremities X 4  Post vital signs: Reviewed and stable  Last Vitals:  Vitals Value Taken Time  BP 134/95 10/18/20 0851  Temp    Pulse 79 10/18/20 0852  Resp 15 10/18/20 0852  SpO2 96 % 10/18/20 0852  Vitals shown include unvalidated device data.  Last Pain:  Vitals:   10/18/20 0655  TempSrc: Oral  PainSc: 7       Patients Stated Pain Goal: 7 (29/03/79 5583)  Complications: No notable events documented.

## 2020-10-18 NOTE — Anesthesia Postprocedure Evaluation (Signed)
Anesthesia Post Note  Patient: Shannon Wilson  Procedure(s) Performed: HERNIA REPAIR INGUINAL ADULT W/MESH (Right: Inguinal)  Patient location during evaluation: PACU Anesthesia Type: General Level of consciousness: awake and alert Pain management: pain level controlled Vital Signs Assessment: post-procedure vital signs reviewed and stable Respiratory status: spontaneous breathing, nonlabored ventilation, respiratory function stable and patient connected to nasal cannula oxygen Cardiovascular status: blood pressure returned to baseline and stable Postop Assessment: no apparent nausea or vomiting Anesthetic complications: no   No notable events documented.   Last Vitals:  Vitals:   10/18/20 0855 10/18/20 0900  BP: 122/80 125/80  Pulse:  83  Resp: 15 18  Temp:    SpO2:  97%    Last Pain:  Vitals:   10/18/20 0900  TempSrc:   PainSc: [place]

## 2020-10-18 NOTE — Progress Notes (Signed)
[facility]  Updated wife. Will send tramadol and zofran to [place]. No heavy lifting > 10 lbs, excessive bending, pushing, pulling, or squatting for 6-8 weeks after surgery.    Curlene Labrum, MD [facility] [address] [place], [place] [address] [phone] (office)

## 2020-10-18 NOTE — Progress Notes (Signed)
Patient states nausea "much better".

## 2020-10-18 NOTE — Anesthesia Preprocedure Evaluation (Signed)
Anesthesia Evaluation  Patient identified by MRN, date of birth, ID band Patient awake    Reviewed: Allergy & Precautions, H&P , NPO status , Patient's Chart, lab work & pertinent test results, reviewed documented beta blocker date and time   Airway Mallampati: II  TM Distance: >3 FB Neck ROM: full    Dental no notable dental hx.    Pulmonary neg pulmonary ROS,    Pulmonary exam normal breath sounds clear to auscultation       Cardiovascular Exercise Tolerance: Good hypertension, + CAD and + Past MI   Rhythm:regular Rate:Normal     Neuro/Psych  Headaches, TIA Neuromuscular disease negative psych ROS   GI/Hepatic Neg liver ROS, GERD  Medicated,  Endo/Other  negative endocrine ROS  Renal/GU negative Renal ROS  negative genitourinary   Musculoskeletal   Abdominal   Peds  Hematology negative hematology ROS (+)   Anesthesia Other Findings   Reproductive/Obstetrics negative OB ROS                             Anesthesia Physical Anesthesia Plan  ASA: 3  Anesthesia Plan: General   Post-op Pain Management:    Induction:   PONV Risk Score and Plan: Ondansetron  Airway Management Planned:   Additional Equipment:   Intra-op Plan:   Post-operative Plan:   Informed Consent: I have reviewed the patients History and Physical, chart, labs and discussed the procedure including the risks, benefits and alternatives for the proposed anesthesia with the patient or authorized representative who has indicated his/her understanding and acceptance.     Dental Advisory Given  Plan Discussed with: CRNA  Anesthesia Plan Comments:         Anesthesia Quick Evaluation

## 2020-10-18 NOTE — Discharge Instructions (Signed)
Discharge Instructions Hernia:  Common Complaints: Pain at the incision site is common. This will improve with time. Take your pain medications as described below. Some nausea is common and poor appetite. The main goal is to stay hydrated the first few days after surgery.  Numbness at the incision or the thigh is common.  If you start to have burning or tingling pain in your groin, this is from a nerve being pinched. Please call and we can prescribe you a different type of pain medication for nerve pain.  You may have swelling and bruising in your groin and scrotum. You can elevate your scrotum with a towel under the scrotum and ice your groin.   Diet/ Activity: Diet as tolerated. You may not have an appetite, but it is important to stay hydrated. Drink 64 ounces of water a day. Your appetite will return with time.  Shower per your regular routine daily.  Do not take hot showers. Take warm showers that are less than 10 minutes. Rest and listen to your body, but do not remain in bed all day.  Walk everyday for at least 15-20 minutes. Deep cough and move around every 1-2 hours in the first few days after surgery.  Do not pick at the dermabond glue on your incision sites.  This glue film will remain in place for 1-2 weeks and will start to peel off.  Do not place lotions or balms on your incision unless instructed to specifically by Dr. Constance Haw.  Do not lift > 10 lbs, perform excessive bending, pushing, pulling, squatting for 6-8 weeks after surgery.   Pain Expectations and Narcotics: -After surgery you will have pain associated with your incisions and this is normal. The pain is muscular and nerve pain, and will get better with time. -You are encouraged and expected to take non narcotic medications like tylenol and ibuprofen (when able) to treat pain as multiple modalities can aid with pain treatment. -Narcotics are only used when pain is severe or there is breakthrough pain. -You are not  expected to have a pain score of 0 after surgery, as we cannot prevent pain. A pain score of 3-4 that allows you to be functional, move, walk, and tolerate some activity is the goal. The pain will continue to improve over the days after surgery and is dependent on your surgery. -Due to [place] law, we are only able to give a certain amount of pain medication to treat post operative pain, and we only give additional narcotics on a patient by patient basis.  -For most laparoscopic surgery, studies have shown that the majority of patients only need 10-15 narcotic pills, and for open surgeries most patients only need 15-20.   -Having appropriate expectations of pain and knowledge of pain management with non narcotics is important as we do not want anyone to become addicted to narcotic pain medication.  -Using ice packs in the first 48 hours and heating pads after 48 hours, wearing an abdominal binder (when recommended), and using over the counter medications are all ways to help with pain management.   -Simple acts like meditation and mindfulness practices after surgery can also help with pain control and research has proven the benefit of these practices.  Medication: Take tylenol and ibuprofen as needed for pain control, alternating every 4-6 hours.  Example:  Tylenol 1011m @ 6am, 12noon, 6pm, 134mnight (Do not exceed 400055mf tylenol a day). Ibuprofen 800m47m9am, 3pm, 9pm, 3am (Do not exceed 3600mg69mibuprofen  a day).  Take Roxicodone for breakthrough pain every 4 hours.  Take Colace for constipation related to narcotic pain medication. If you do not have a bowel movement in 2 days, take Miralax over the counter.  Drink plenty of water to also prevent constipation.   Contact Information: If you have questions or concerns, please call our office, [phone], Monday- Thursday 8AM-5PM and Friday 8AM-12Noon.  If it is after hours or on the weekend, please call [facility] Main [facility], [phone],  [phone], and ask to speak to the surgeon on call for Dr. Constance Haw at [facility].

## 2020-10-18 NOTE — Progress Notes (Signed)
Dr. Constance Haw notified via telephone regarding patient unable to void, per Dr. Constance Haw ok for patient to have saltine crackers at his request. No further orders.

## 2020-10-18 NOTE — Anesthesia Procedure Notes (Signed)
Procedure Name: Intubation Date/Time: 10/18/2020 7:35 AM Performed by: Tacy Learn, CRNA Pre-anesthesia Checklist: Patient identified, Emergency Drugs available, Suction available, Patient being monitored and Timeout performed Patient Re-evaluated:Patient Re-evaluated prior to induction Oxygen Delivery Method: Circle system utilized Preoxygenation: Pre-oxygenation with 100% oxygen Induction Type: IV induction Laryngoscope Size: Miller and 3 Grade View: Grade II Tube type: Oral Tube size: 8.0 mm Number of attempts: 1 Airway Equipment and Method: Stylet Placement Confirmation: ETT inserted through vocal cords under direct vision, positive ETCO2, CO2 detector and breath sounds checked- equal and bilateral Secured at: 23 cm Tube secured with: Tape Dental Injury: Teeth and Oropharynx as per pre-operative assessment

## 2020-10-18 NOTE — Progress Notes (Signed)
Patient complain of nausea. Reviewed with Dr. Briant Cedar, anesthesiologist. Verbal order received.Wife notified of patient in phase 2, advised wife, waiting for urination and nausea to improve. Wife verbalized understanding.

## 2020-10-19 ENCOUNTER — Encounter (HOSPITAL_COMMUNITY): Payer: Self-pay | Admitting: General Surgery

## 2020-10-26 ENCOUNTER — Other Ambulatory Visit: Payer: Self-pay

## 2020-10-26 ENCOUNTER — Ambulatory Visit (INDEPENDENT_AMBULATORY_CARE_PROVIDER_SITE_OTHER): Payer: Medicare Other | Admitting: General Surgery

## 2020-10-26 ENCOUNTER — Encounter: Payer: Self-pay | Admitting: General Surgery

## 2020-10-26 VITALS — BP 157/107 | HR 64 | Temp 99.0°F | Resp 14 | Ht 71.0 in | Wt 189.0 lb

## 2020-10-26 DIAGNOSIS — K4090 Unilateral inguinal hernia, without obstruction or gangrene, not specified as recurrent: Secondary | ICD-10-CM

## 2020-10-28 NOTE — Progress Notes (Signed)
[facility]  Worried about some swelling. Otherwise well with only soreness, no drainage or redness.  BP (!) 157/107   Pulse 64   Temp 99 F (37.2 C) (Other (Comment))   Resp 14   Ht 5' 11"  (1.803 m)   Wt 189 lb (85.7 kg)   SpO2 95%   BMI 26.36 kg/m  NAD Right inguinal incision healing, no erythema or drainage Swelling and induration, normal  No heavy lifting > 10 lbs, excessive bending, pushing, pulling, or squatting for 6-8 weeks after surgery.   Future Appointments  Date Time Provider [place]  11/23/2020  9:45 AM Virl Cagey, MD [facility] None    Curlene Labrum, MD [facility] [address] [place], [place] [address] [phone] (office)

## 2020-10-31 ENCOUNTER — Emergency Department (HOSPITAL_COMMUNITY)
Admission: EM | Admit: 2020-10-31 | Discharge: 2020-10-31 | Disposition: A | Discharge status: Home / Self Care | Payer: Medicare Other | Attending: Emergency Medicine | Admitting: Emergency Medicine

## 2020-10-31 ENCOUNTER — Encounter (HOSPITAL_COMMUNITY): Payer: Self-pay | Admitting: *Deleted

## 2020-10-31 ENCOUNTER — Other Ambulatory Visit: Payer: Self-pay

## 2020-10-31 DIAGNOSIS — I10 Essential (primary) hypertension: Secondary | ICD-10-CM | POA: Insufficient documentation

## 2020-10-31 DIAGNOSIS — Z7982 Long term (current) use of aspirin: Secondary | ICD-10-CM | POA: Diagnosis not present

## 2020-10-31 DIAGNOSIS — Z28310 Unvaccinated for covid-19: Secondary | ICD-10-CM | POA: Diagnosis not present

## 2020-10-31 DIAGNOSIS — U071 COVID-19: Secondary | ICD-10-CM

## 2020-10-31 DIAGNOSIS — Z79899 Other long term (current) drug therapy: Secondary | ICD-10-CM | POA: Diagnosis not present

## 2020-10-31 DIAGNOSIS — R509 Fever, unspecified: Secondary | ICD-10-CM | POA: Diagnosis present

## 2020-10-31 LAB — RESP PANEL BY RT-PCR (FLU A&B, COVID) ARPGX2
Influenza A by PCR: NEGATIVE
Influenza B by PCR: NEGATIVE
SARS Coronavirus 2 by RT PCR: POSITIVE — AB

## 2020-10-31 LAB — CBC WITH DIFFERENTIAL/PLATELET
Abs Immature Granulocytes: 0.02 10*3/uL (ref 0.00–0.07)
Basophils Absolute: 0 10*3/uL (ref 0.0–0.1)
Basophils Relative: 0 %
Eosinophils Absolute: 0.1 10*3/uL (ref 0.0–0.5)
Eosinophils Relative: 2 %
HCT: 40.9 % (ref 39.0–52.0)
Hemoglobin: 13.8 g/dL (ref 13.0–17.0)
Immature Granulocytes: 0 %
Lymphocytes Relative: 11 %
Lymphs Abs: 0.8 10*3/uL (ref 0.7–4.0)
MCH: 29.6 pg (ref 26.0–34.0)
MCHC: 33.7 g/dL (ref 30.0–36.0)
MCV: 87.8 fL (ref 80.0–100.0)
Monocytes Absolute: 0.9 10*3/uL (ref 0.1–1.0)
Monocytes Relative: 13 %
Neutro Abs: 5.2 10*3/uL (ref 1.7–7.7)
Neutrophils Relative %: 74 %
Platelets: 293 10*3/uL (ref 150–400)
RBC: 4.66 MIL/uL (ref 4.22–5.81)
RDW: 12.6 % (ref 11.5–15.5)
WBC: 7 10*3/uL (ref 4.0–10.5)
nRBC: 0 % (ref 0.0–0.2)

## 2020-10-31 LAB — BASIC METABOLIC PANEL
Anion gap: 6 (ref 5–15)
BUN: 14 mg/dL (ref 6–20)
CO2: 25 mmol/L (ref 22–32)
Calcium: 9.1 mg/dL (ref 8.9–10.3)
Chloride: 108 mmol/L (ref 98–111)
Creatinine, Ser: 1.18 mg/dL (ref 0.61–1.24)
GFR, Estimated: >60 mL/min (ref >60)
Glucose, Bld: 123 mg/dL — ABNORMAL HIGH (ref 70–99)
Potassium: 3.4 mmol/L — ABNORMAL LOW (ref 3.5–5.1)
Sodium: 139 mmol/L (ref 135–145)

## 2020-10-31 MED ORDER — MOLNUPIRAVIR EUA 200MG CAPSULE: 4.0000 | ORAL_CAPSULE | Freq: Two times a day (BID) | ORAL | 0 refills | Status: AC

## 2020-10-31 MED ORDER — IBUPROFEN 800 MG PO TABS
800.0000 mg | ORAL_TABLET | Freq: Once | ORAL | Status: AC
  Administered 2020-10-31: 800 mg via ORAL
  Filled 2020-10-31: qty 1

## 2020-10-31 NOTE — ED Provider Notes (Signed)
[facility] Provider Note   CSN: [id number] Arrival date & time: 10/31/20  1631     History Chief Complaint  Patient presents with   Generalized Body Aches    Wife has covid     Shannon Wilson is a 50 y.o. male.  HPI  Patient with significant medical history of back pain, hypertension, hyperlipidemia TIA presents to the emergency department with chief complaint of URI-like symptoms.  Patient states this started today, he endorses fevers, chills, headaches, nasal congestion, productive cough, chest tightness, nausea, general body aches.  He denies vomiting, diarrhea, still tolerating p.o., he is not vaccine against COVID-19, states that his wife was recently diagnosed with COVID, has no other complaints this time.  He has not tried any over-the-counter pain medications, denies  alleviating or aggravating factors at this time.  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    Patient Active Problem List   Diagnosis Date Noted   Right inguinal hernia 10/07/2020   Lumbosacral radiculopathy at S1 09/04/2017   Epidural fibrosis 09/04/2017   TIA (transient ischemic attack) 12/25/2016   Vitamin D deficiency 10/02/2016   Essential hypertension 08/18/2016   History of MI (myocardial infarction) 08/18/2016   Coronary artery disease involving native heart without angina pectoris 08/18/2016   Chronic low back pain 08/18/2016   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   Elevated random blood glucose level 08/18/2016   Lactose intolerance in adult 08/18/2016    Past Surgical History:  Procedure Laterality Date   BACK SURGERY  08/2012   INGUINAL HERNIA REPAIR Right 10/18/2020   Procedure: HERNIA  REPAIR INGUINAL ADULT W/MESH;  Surgeon: Virl Cagey, MD;  Location: [facility];  Service: General;  Laterality: Right;  pt knows to arrive at [place]  08/2012   discectomy       Family History  Problem Relation Age of Onset   Hypertension Mother    Heart disease Mother 36   Cancer Father        prostate   Sickle cell trait Father    Sickle cell trait Son    Cancer Maternal Grandmother        bone   Cancer Paternal Grandmother    Cancer Paternal Grandfather        liver   Learning disabilities Neg Hx     Social History   Tobacco Use   Smoking status: Never   Smokeless tobacco: Never  Vaping Use   Vaping Use: Never used  Substance Use Topics   Alcohol use: No   Drug use: No    Home Medications Prior to Admission medications   Medication Sig Start Date End Date Taking? Authorizing Provider  molnupiravir EUA 200 mg CAPS Take 4 capsules (800 mg total) by mouth 2 (two) times daily for 5 days. 10/31/20 11/05/20 Yes Marcello Fennel, PA-C  acetaminophen (TYLENOL 8 HOUR) 650 MG CR tablet Take 1 tablet (650 mg total) by mouth every 8 (eight) hours. Patient not taking: No sig reported 11/05/18   Varney Biles, MD  amLODipine (NORVASC) 10 MG tablet Take 1 tablet (10 mg total) by mouth daily. 11/03/18  Hayden Rasmussen, MD  aspirin EC 81 MG tablet Take 1 tablet (81 mg total) by mouth 2 (two) times a week. 10/18/20   Virl Cagey, MD  cyclobenzaprine (FLEXERIL) 10 MG tablet Take 10 mg by mouth 3 (three) times daily as needed for muscle spasms. 05/03/20   [provider]  cyclobenzaprine (FLEXERIL) 5 MG tablet Take 1 tablet (5 mg total) by mouth at bedtime. Patient not taking: No sig reported 06/18/17   Raylene Everts, MD  gabapentin (NEURONTIN) 300 MG capsule Take 300 mg by mouth 3 (three) times daily as needed (pain).    [provider]  ibuprofen (ADVIL) 200 MG tablet Take 200 mg by mouth every 6 (six) hours as needed for moderate pain.     [provider]  ibuprofen (ADVIL) 600 MG tablet Take 1 tablet (600 mg total) by mouth every 6 (six) hours as needed. Patient not taking: No sig reported 07/03/20   Corena Herter, PA-C  ibuprofen (ADVIL) 800 MG tablet Take 800 mg by mouth 3 (three) times daily as needed for moderate pain. 09/14/20   [provider]  lisinopril (PRINIVIL,ZESTRIL) 40 MG tablet Take 1 tablet (40 mg total) by mouth daily. Patient not taking: No sig reported 04/05/17   Raylene Everts, MD  lisinopril (ZESTRIL) 20 MG tablet Take 20 mg by mouth daily.    [provider]  metaxalone (SKELAXIN) 800 MG tablet Take 1 tablet (800 mg total) by mouth 3 (three) times daily. Patient not taking: No sig reported 12/14/17   Evalee [place], PA-C  methocarbamol (ROBAXIN) 500 MG tablet Take 500 mg by mouth every 6 (six) hours as needed for muscle spasms.    [provider]  metoCLOPramide (REGLAN) 10 MG tablet Take 1 tablet (10 mg total) by mouth every 6 (six) hours as needed for nausea (or headache). Patient not taking: No sig reported 03/28/18   Francine Graven, DO  ondansetron (ZOFRAN) 4 MG tablet Take 1 tablet (4 mg total) by mouth every 8 (eight) hours as needed. 10/18/20 10/18/21  Virl Cagey, MD  rosuvastatin (CRESTOR) 20 MG tablet Take 20 mg by mouth at bedtime. 07/21/20   [provider]  traMADol (ULTRAM) 50 MG tablet Take 1 tablet (50 mg total) by mouth every 6 (six) hours as needed for severe pain. 10/18/20 10/18/21  Virl Cagey, MD    Allergies    Hydrocodone-acetaminophen  Review of Systems   Review of Systems  Constitutional:  Positive for chills and fever.  HENT:  Positive for congestion. Negative for sore throat.   Respiratory:  Positive for cough. Negative for shortness of breath.   Cardiovascular:  Negative for chest pain.  Gastrointestinal:  Positive for nausea. Negative for abdominal pain, diarrhea and vomiting.  Genitourinary:  Negative for enuresis.   Musculoskeletal:  Positive for myalgias. Negative for back pain.  Skin:  Negative for rash.  Neurological:  Positive for headaches. Negative for dizziness.  Hematological:  Does not bruise/bleed easily.   Physical Exam Updated Vital Signs BP (!) 160/110   Pulse (!) 104   Temp 100.2 F (37.9 C) (Oral)   Resp 18   Ht 5' 11"  (1.803 m)   Wt 85.7 kg   SpO2 98%   BMI 26.36 kg/m   Physical Exam Vitals and nursing note reviewed.  Constitutional:      General: He is not in acute distress.    Appearance: He is not ill-appearing.  HENT:  Head: Normocephalic and atraumatic.     Right Ear: Tympanic membrane normal.     Left Ear: Tympanic membrane normal.     Nose: Congestion present.     Mouth/Throat:     Mouth: Mucous membranes are moist.     Pharynx: Oropharynx is clear. No oropharyngeal exudate or posterior oropharyngeal erythema.  Eyes:     Conjunctiva/sclera: Conjunctivae normal.  Cardiovascular:     Rate and Rhythm: Normal rate and regular rhythm.     Pulses: Normal pulses.     Heart sounds: No murmur heard.   No friction rub. No gallop.  Pulmonary:     Effort: No respiratory distress.     Breath sounds: No wheezing, rhonchi or rales.  Musculoskeletal:     Right lower leg: No edema.     Left lower leg: No edema.  Skin:    General: Skin is warm and dry.  Neurological:     Mental Status: He is alert.  Psychiatric:        Mood and Affect: Mood normal.    ED Results / Procedures / Treatments   Labs (all labs ordered are listed, but only abnormal results are displayed) Labs Reviewed  RESP PANEL BY RT-PCR (FLU A&B, COVID) ARPGX2 - Abnormal; Notable for the following components:      Result Value   SARS Coronavirus 2 by RT PCR POSITIVE (*)    All other components within normal limits  BASIC METABOLIC PANEL - Abnormal; Notable for the following components:   Potassium 3.4 (*)    Glucose, Bld 123 (*)    All other components within normal limits  CBC WITH  DIFFERENTIAL/PLATELET    EKG None  Radiology No results found.  Procedures Procedures   Medications Ordered in ED Medications  ibuprofen (ADVIL) tablet 800 mg (800 mg Oral Given 10/31/20 1825)    ED Course  I have reviewed the triage vital signs and the nursing notes.  Pertinent labs & imaging results that were available during my care of the patient were reviewed by me and considered in my medical decision making (see chart for details).    MDM Rules/Calculators/A&P                          Initial impression-patient presents with URI-like symptoms.  He is alert, does not appear in acute stress, vital signs are for tachycardia, fever, will obtain basic lab work-up, provide patient with Motrin and reassess.  Work-up-CBC unremarkable, BMP shows slight hypokalemia 3.4, hyperglycemia 123, respiratory panel positive for COVID  Rule out-Low suspicion for systemic infection as patient is nontoxic-appearing, no obvious source infection noted on exam.  Patient noted be tachycardic but I suspect this is likely from positive COVID infection.  Low suspicion for pneumonia as lung sounds are clear bilaterally, will defer imaging at this time.  I have low suspicion for PE as patient denies pleuritic chest pain, shortness of breath, vital signs reassuring, nontachypneic, nonhypoxic low suspicion for strep throat as oropharynx was visualized, no erythema or exudates noted.  Low suspicion patient would need  hospitalized due to viral infection or Covid as vital signs reassuring, patient is not in respiratory distress.    Plan-  URI-suspect patient suffering from [place], will start him on antivirals as he is at a increased risk for adverse outcome due to his unvaccinated state.  Unfortunate patient has a drug drug interactions to paxlovid and will defer to alternate antivirals.  Have him  follow-up with [place] [facility] for further evaluation.  Vital signs have remained stable, no indication for hospital  admission.   Patient given at home care as well strict return precautions.  Patient verbalized that they understood agreed to said plan.  Final Clinical Impression(s) / ED Diagnoses Final diagnoses:  COVID    Rx / DC Orders ED Discharge Orders          Ordered    molnupiravir EUA 200 mg CAPS  2 times daily        10/31/20 2010             Aron Baba 10/31/20 2012    Milton Ferguson, MD 11/01/20 1006

## 2020-10-31 NOTE — ED Notes (Addendum)
Critical Result: Covid: + Reported to [place].

## 2020-10-31 NOTE — Discharge Instructions (Addendum)
You have covid, started on antiviral treatment please take as prescribed.  I recommend taking Tylenol for fever control and ibuprofen for pain control please follow dosing on the back of bottle.  I recommend staying hydrated and if you do not an appetite, I recommend soups as this will provide you with fluids and calories.    you are Covid positive you must self quarantine for 5 days starting on symptom onset, if at the end of those 5 days you are feeling better you may return back to school/work, if you continue to have symptoms you must self quarantine for additional 5 days.  I would like you to contact "post Covid care" as they will provide you with information how to manage your Covid symptoms if you are Covid positive.   Come back to the emergency department if you develop chest pain, shortness of breath, severe abdominal pain, uncontrolled nausea, vomiting, diarrhea.

## 2020-10-31 NOTE — ED Triage Notes (Signed)
Pt c/o generalized body aches that started today around 2pm; pt states his wife was diagnosed with covid x 2 days ago

## 2020-11-07 DIAGNOSIS — I10 Essential (primary) hypertension: Secondary | ICD-10-CM | POA: Diagnosis not present

## 2020-11-07 DIAGNOSIS — E7849 Other hyperlipidemia: Secondary | ICD-10-CM | POA: Diagnosis not present

## 2020-11-08 DIAGNOSIS — Z79899 Other long term (current) drug therapy: Secondary | ICD-10-CM | POA: Diagnosis not present

## 2020-11-08 DIAGNOSIS — G894 Chronic pain syndrome: Secondary | ICD-10-CM | POA: Diagnosis not present

## 2020-11-10 DIAGNOSIS — U099 Post covid-19 condition, unspecified: Secondary | ICD-10-CM | POA: Diagnosis not present

## 2020-11-23 ENCOUNTER — Encounter: Payer: Self-pay | Admitting: General Surgery

## 2020-11-23 ENCOUNTER — Ambulatory Visit (INDEPENDENT_AMBULATORY_CARE_PROVIDER_SITE_OTHER): Payer: Medicare Other | Admitting: General Surgery

## 2020-11-23 ENCOUNTER — Other Ambulatory Visit: Payer: Self-pay

## 2020-11-23 VITALS — BP 153/93 | HR 64 | Temp 98.7°F | Resp 14 | Ht 71.0 in | Wt 187.0 lb

## 2020-11-23 DIAGNOSIS — K4090 Unilateral inguinal hernia, without obstruction or gangrene, not specified as recurrent: Secondary | ICD-10-CM

## 2020-11-23 NOTE — Progress Notes (Signed)
[facility]  No pain complaints.   BP (!) 153/93   Pulse 64   Temp 98.7 F (37.1 C) (Other (Comment))   Resp 14   Ht 5' 11"  (1.803 m)   Wt 187 lb (84.8 kg)   SpO2 95%   BMI 26.08 kg/m   Right inguinal hernia site healing, minor induration, no erythema or drainage  Post op from R inguinal hernia repair with mesh. Doing well. Did have COVID in late July. But feeling better now.   Ok to start lifting over 10 lbs gradually, bending pushing squatting, bending once you are 6 weeks out from surgery.   PRN follow up   Curlene Labrum, MD [facility] [address] [place], [place] [address] [phone] (office)

## 2020-11-23 NOTE — Patient Instructions (Signed)
Ok to start lifting over 10 lbs gradually, bending pushing squatting, bending once you are 6 weeks out from surgery.

## 2020-12-09 DIAGNOSIS — G894 Chronic pain syndrome: Secondary | ICD-10-CM | POA: Diagnosis not present

## 2020-12-09 DIAGNOSIS — Z79899 Other long term (current) drug therapy: Secondary | ICD-10-CM | POA: Diagnosis not present

## 2020-12-14 ENCOUNTER — Other Ambulatory Visit: Payer: Self-pay

## 2020-12-14 ENCOUNTER — Emergency Department (HOSPITAL_COMMUNITY): Payer: Medicare Other

## 2020-12-14 ENCOUNTER — Encounter (HOSPITAL_COMMUNITY): Payer: Self-pay

## 2020-12-14 ENCOUNTER — Emergency Department (HOSPITAL_COMMUNITY)
Admission: EM | Admit: 2020-12-14 | Discharge: 2020-12-14 | Disposition: A | Discharge status: Home / Self Care | Payer: Medicare Other | Attending: Emergency Medicine | Admitting: Emergency Medicine

## 2020-12-14 DIAGNOSIS — E785 Hyperlipidemia, unspecified: Secondary | ICD-10-CM | POA: Diagnosis not present

## 2020-12-14 DIAGNOSIS — R531 Weakness: Secondary | ICD-10-CM | POA: Diagnosis not present

## 2020-12-14 DIAGNOSIS — Z7982 Long term (current) use of aspirin: Secondary | ICD-10-CM | POA: Diagnosis not present

## 2020-12-14 DIAGNOSIS — R202 Paresthesia of skin: Secondary | ICD-10-CM | POA: Diagnosis not present

## 2020-12-14 DIAGNOSIS — I10 Essential (primary) hypertension: Secondary | ICD-10-CM | POA: Diagnosis not present

## 2020-12-14 DIAGNOSIS — Z79899 Other long term (current) drug therapy: Secondary | ICD-10-CM | POA: Insufficient documentation

## 2020-12-14 DIAGNOSIS — R519 Headache, unspecified: Secondary | ICD-10-CM | POA: Diagnosis not present

## 2020-12-14 DIAGNOSIS — R200 Anesthesia of skin: Secondary | ICD-10-CM

## 2020-12-14 DIAGNOSIS — R7301 Impaired fasting glucose: Secondary | ICD-10-CM | POA: Diagnosis not present

## 2020-12-14 LAB — CBC WITH DIFFERENTIAL/PLATELET
Abs Immature Granulocytes: 0.01 10*3/uL (ref 0.00–0.07)
Basophils Absolute: 0 10*3/uL (ref 0.0–0.1)
Basophils Relative: 1 %
Eosinophils Absolute: 0.2 10*3/uL (ref 0.0–0.5)
Eosinophils Relative: 3 %
HCT: 38.3 % — ABNORMAL LOW (ref 39.0–52.0)
Hemoglobin: 12.9 g/dL — ABNORMAL LOW (ref 13.0–17.0)
Immature Granulocytes: 0 %
Lymphocytes Relative: 32 %
Lymphs Abs: 2 10*3/uL (ref 0.7–4.0)
MCH: 29.5 pg (ref 26.0–34.0)
MCHC: 33.7 g/dL (ref 30.0–36.0)
MCV: 87.6 fL (ref 80.0–100.0)
Monocytes Absolute: 0.5 10*3/uL (ref 0.1–1.0)
Monocytes Relative: 8 %
Neutro Abs: 3.6 10*3/uL (ref 1.7–7.7)
Neutrophils Relative %: 56 %
Platelets: 295 10*3/uL (ref 150–400)
RBC: 4.37 MIL/uL (ref 4.22–5.81)
RDW: 13.2 % (ref 11.5–15.5)
WBC: 6.4 10*3/uL (ref 4.0–10.5)
nRBC: 0 % (ref 0.0–0.2)

## 2020-12-14 LAB — COMPREHENSIVE METABOLIC PANEL
ALT: 31 U/L (ref 0–44)
AST: 30 U/L (ref 15–41)
Albumin: 4.1 g/dL (ref 3.5–5.0)
Alkaline Phosphatase: 99 U/L (ref 38–126)
Anion gap: 4 — ABNORMAL LOW (ref 5–15)
BUN: 17 mg/dL (ref 6–20)
CO2: 26 mmol/L (ref 22–32)
Calcium: 8.7 mg/dL — ABNORMAL LOW (ref 8.9–10.3)
Chloride: 110 mmol/L (ref 98–111)
Creatinine, Ser: 1.17 mg/dL (ref 0.61–1.24)
GFR, Estimated: >60 mL/min (ref >60)
Glucose, Bld: 117 mg/dL — ABNORMAL HIGH (ref 70–99)
Potassium: 3.3 mmol/L — ABNORMAL LOW (ref 3.5–5.1)
Sodium: 140 mmol/L (ref 135–145)
Total Bilirubin: 0.7 mg/dL (ref 0.3–1.2)
Total Protein: 7 g/dL (ref 6.5–8.1)

## 2020-12-14 NOTE — ED Provider Notes (Signed)
[facility] EMERGENCY DEPARTMENT Provider Note   CSN: [id number] Arrival date & time: 12/14/20  1642     History Chief Complaint  Patient presents with   Weakness    Shannon Wilson is a 50 y.o. male.  Pt complains of tingling in the left side of his face.  Pt reports he noticed tingling 2 days ago  Pt reports today he noticed some drooping of his mouth.  Pt has a history of hypertension    The history is provided by the patient. No language interpreter was used.  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    Patient Active Problem List   Diagnosis Date Noted   Right inguinal hernia 10/07/2020   Lumbosacral radiculopathy at S1 09/04/2017   Epidural fibrosis 09/04/2017   TIA (transient ischemic attack) 12/25/2016   Vitamin D deficiency 10/02/2016   Essential hypertension 08/18/2016   History of MI (myocardial infarction) 08/18/2016   Coronary artery disease involving native heart without angina pectoris 08/18/2016   Chronic low back pain 08/18/2016   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   Elevated random blood glucose level 08/18/2016   Lactose intolerance in adult 08/18/2016    Past Surgical History:  Procedure Laterality Date   BACK SURGERY  08/2012   INGUINAL HERNIA REPAIR Right 10/18/2020   Procedure: HERNIA REPAIR INGUINAL ADULT W/MESH;  Surgeon: Virl Cagey, MD;  Location: [facility];  Service: General;  Laterality: Right;  pt knows to arrive at [place]  08/2012   discectomy       Family History  Problem Relation Age of Onset   Hypertension Mother    Heart disease Mother 94   Cancer Father        prostate   Sickle cell trait Father    Sickle cell trait Son    Cancer Maternal Grandmother        bone   Cancer Paternal Grandmother    Cancer Paternal Grandfather        liver   Learning disabilities Neg Hx     Social History   Tobacco Use   Smoking status: Never   Smokeless tobacco: Never  Vaping Use   Vaping Use: Never used  Substance Use Topics   Alcohol use: No   Drug use: No    Home Medications Prior to Admission medications   Medication Sig Start Date End Date Taking? Authorizing Provider  acetaminophen (TYLENOL 8 HOUR) 650 MG CR tablet Take 1 tablet (650 mg total) by mouth every 8 (eight) hours. 11/05/18   Varney Biles, MD  amLODipine (NORVASC) 10 MG tablet Take 1 tablet (10 mg total) by mouth daily. 11/03/18   Hayden Rasmussen, MD  aspirin EC 81 MG tablet Take 1 tablet (81 mg total) by mouth 2 (two) times a week. 10/18/20   Virl Cagey, MD  cyclobenzaprine (FLEXERIL) 5 MG tablet Take 1 tablet (5 mg  total) by mouth at bedtime. Patient taking differently: Take 5 mg by mouth at bedtime as needed. 06/18/17   Raylene Everts, MD  gabapentin (NEURONTIN) 300 MG capsule Take 300 mg by mouth 3 (three) times daily as needed (pain).    [provider]  ibuprofen (ADVIL) 800 MG tablet Take 800 mg by mouth 3 (three) times daily as needed for moderate pain. 09/14/20   [provider]  lisinopril (ZESTRIL) 20 MG tablet Take 20 mg by mouth daily.    [provider]  methocarbamol (ROBAXIN) 500 MG tablet Take 500 mg by mouth every 6 (six) hours as needed for muscle spasms.    [provider]  rosuvastatin (CRESTOR) 20 MG tablet Take 20 mg by mouth at bedtime. 07/21/20   [provider]  traMADol (ULTRAM) 50 MG tablet Take 1 tablet (50 mg total) by mouth every 6 (six) hours  as needed for severe pain. 10/18/20 10/18/21  Virl Cagey, MD    Allergies    Hydrocodone-acetaminophen  Review of Systems   Review of Systems  Neurological:  Positive for weakness.  All other systems reviewed and are negative.  Physical Exam Updated Vital Signs BP (!) 149/90 (BP Location: Left Arm)   Pulse 82   Temp 98.4 F (36.9 C) (Oral)   Resp 16   Ht 5' 11"  (1.803 m)   Wt 87.1 kg   SpO2 99%   BMI 26.78 kg/m   Physical Exam Vitals and nursing note reviewed.  Constitutional:      Appearance: He is well-developed.  HENT:     Head: Normocephalic and atraumatic.     Right Ear: External ear normal.     Left Ear: External ear normal.     Nose: Nose normal.     Mouth/Throat:     Mouth: Mucous membranes are moist.  Eyes:     Conjunctiva/sclera: Conjunctivae normal.  Cardiovascular:     Rate and Rhythm: Normal rate and regular rhythm.     Heart sounds: No murmur heard. Pulmonary:     Effort: Pulmonary effort is normal. No respiratory distress.     Breath sounds: Normal breath sounds.  Abdominal:     Palpations: Abdomen is soft.     Tenderness: There is no abdominal tenderness.  Musculoskeletal:     Cervical back: Neck supple.  Skin:    General: Skin is warm and dry.  Neurological:     General: No focal deficit present.     Mental Status: He is alert.     Comments: Slight droop left side of mouth  decreased sensation left face.    Psychiatric:        Mood and Affect: Mood normal.    ED Results / Procedures / Treatments   Labs (all labs ordered are listed, but only abnormal results are displayed) Labs Reviewed  CBC WITH DIFFERENTIAL/PLATELET - Abnormal; Notable for the following components:      Result Value   Hemoglobin 12.9 (*)    HCT 38.3 (*)    All other components within normal limits  COMPREHENSIVE METABOLIC PANEL - Abnormal; Notable for the following components:   Potassium 3.3 (*)    Glucose, Bld 117 (*)    Calcium 8.7 (*)    Anion gap 4  (*)    All other components within normal limits  URINALYSIS, ROUTINE W REFLEX MICROSCOPIC    EKG EKG Interpretation  Date/Time:  Tuesday December 14 2020 17:47:59 EDT Ventricular Rate:  74 PR  Interval:  146 QRS Duration: 95 QT Interval:  388 QTC Calculation: 431 R Axis:   7 Text Interpretation: Sinus rhythm RSR' in V1 or V2, right VCD or RVH No significant change since last tracing Confirmed by Calvert Cantor [id number]) on 12/14/2020 6:14:29 PM  Radiology CT HEAD WO CONTRAST (5MM)  Result Date: 12/14/2020 CLINICAL DATA:  Left-sided facial numbness and headache. EXAM: CT HEAD WITHOUT CONTRAST TECHNIQUE: Contiguous axial images were obtained from the base of the skull through the vertex without intravenous contrast. COMPARISON:  CT head 11/03/2018. FINDINGS: Brain: No evidence of acute infarction, hemorrhage, hydrocephalus, extra-axial collection or mass lesion/mass effect. Vascular: No hyperdense vessel or unexpected calcification. Skull: Normal. Negative for fracture or focal lesion. Sinuses/Orbits: There stable polyps or mucous retention cysts in the left frontal sinus and right ethmoid air cell measuring up to 13 mm. The visualized paranasal sinuses and mastoid air cells are otherwise clear. Other: None. IMPRESSION: No acute intracranial process. Electronically Signed   By: Ronney Asters M.D.   On: 12/14/2020 18:30    Procedures Procedures   Medications Ordered in ED Medications - No data to display  ED Course  I have reviewed the triage vital signs and the nursing notes.  Pertinent labs & imaging results that were available during my care of the patient were reviewed by me and considered in my medical decision making (see chart for details).    MDM Rules/Calculators/A&P                           MDM:  Ct scan  no acute abnormality.  Dr. Karle Starch in to see and examine.  I spoke to Dr. Cur[facility] [facility] on call.  Pt offered option of MRI.  Pt has had 2 similar episodes in the past.   Pt prefers outpatient evaluation.    Pt has a neurologist that he sees in [place]   Final Clinical Impression(s) / ED Diagnoses Final diagnoses:  None    Rx / DC Orders ED Discharge Orders     None        Sidney Ace 12/14/20 1916    Truddie Hidden, MD 12/14/20 2311

## 2020-12-14 NOTE — Discharge Instructions (Addendum)
Follow up with your neurologist for evaluation

## 2020-12-14 NOTE — Plan of Care (Signed)
Briefly curb sided by PA [facility] regarding this patient and need for further work-up.  In brief he is a 50 year old gentleman with past medical history of hypertension, hyperlipidemia, migraines, recent COVID-19 infection and prior transient facial tingling who presents with left facial numbness that has been ongoing for 2 days.  Per triage notes patient had tingling in the left face starting 2 days ago (which PA clarifies has been fairly constant), additionally had some blurry vision yesterday which improved and then returned with the patient presenting additionally for concern for subjective left facial droop which is not appreciated by the PA or by Dr. Karle Starch per PA's report.  CT head notes it was obtained for headache as well although PA reports patient denied any headache to her.  On review of prior neurology records, patient presented in September 2018 with left face and arm tingling and had a stroke work-up at the time.  He was additionally evaluated by telemetry neurology in December 2019 for tingling that spread from the right to the left face, was felt to be complicated migraine and treated as such.  PA reports patient denied prior similar symptoms.  We discussed that she should discuss these prior presentations with the patient and if he feels this presentation is overall similar, as recurrent nondisabling symptoms are fairly likely to be benign, he may be discharged with close outpatient follow-up, but that if he does feel that he has truly new symptoms, admission for observation and MRI brain with and without contrast with thin cuts through the brainstem tomorrow would be useful to delineate potential inflammatory/infectious versus stroke symptoms (given symptoms been ongoing for 2 days, would not call this a TIA).  He could then be seen by Dr. 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  Current Outpatient Medications:    amLODipine (NORVASC) 10 MG tablet, Take 1 tablet (10 mg total) by mouth daily., Disp: 30 tablet, Rfl: 0   cyclobenzaprine (FLEXERIL) 5 MG tablet, Take 1 tablet (5 mg total) by mouth at bedtime. (Patient taking differently: Take 5 mg by mouth at bedtime as needed.), Disp: 20 tablet, Rfl: 0   gabapentin (NEURONTIN) 300 MG capsule, Take 300 mg by mouth 3 (three) times daily as needed (pain)., Disp: , Rfl:    ibuprofen (ADVIL) 800 MG tablet, Take 800 mg by mouth 3 (three) times daily as needed for moderate pain., Disp: , Rfl:    lisinopril (ZESTRIL) 20 MG tablet, Take 20 mg by mouth daily.,  Disp: , Rfl:    rosuvastatin (CRESTOR) 20 MG tablet, Take 20 mg by mouth at bedtime., Disp: , Rfl:    traMADol (ULTRAM) 50 MG tablet, Take 1 tablet (50 mg total) by mouth every 6 (six) hours as needed for severe pain., Disp: 20 tablet, Rfl: 0   acetaminophen (TYLENOL 8 HOUR) 650 MG CR tablet, Take 1 tablet (650 mg total) by mouth every 8 (eight) hours. (Patient not taking: Reported on 12/14/2020), Disp: 30 tablet, Rfl: 0   aspirin EC 81 MG  tablet, Take 1 tablet (81 mg total) by mouth 2 (two) times a week. (Patient not taking: No sig reported), Disp: , Rfl:    methocarbamol (ROBAXIN) 500 MG tablet, Take 500 mg by mouth every 6 (six) hours as needed for muscle spasms. (Patient not taking: Reported on 12/14/2020), Disp: , Rfl:

## 2020-12-14 NOTE — ED Triage Notes (Signed)
Pt in er room number 7, Provider at bedside for fast exam, pt states that he started having numbness and tingling in his L face about two days ago, states that he started having blurry vision yesterday, states that it got better and then came back about 30 minutes ago, pt has some L facial droop, no slurred speech noted.

## 2020-12-15 DIAGNOSIS — M545 Low back pain, unspecified: Secondary | ICD-10-CM | POA: Diagnosis not present

## 2020-12-15 DIAGNOSIS — G894 Chronic pain syndrome: Secondary | ICD-10-CM | POA: Diagnosis not present

## 2020-12-15 DIAGNOSIS — R030 Elevated blood-pressure reading, without diagnosis of hypertension: Secondary | ICD-10-CM | POA: Diagnosis not present

## 2020-12-20 DIAGNOSIS — R7301 Impaired fasting glucose: Secondary | ICD-10-CM | POA: Diagnosis not present

## 2020-12-20 DIAGNOSIS — R809 Proteinuria, unspecified: Secondary | ICD-10-CM | POA: Diagnosis not present

## 2020-12-20 DIAGNOSIS — E785 Hyperlipidemia, unspecified: Secondary | ICD-10-CM | POA: Diagnosis not present

## 2020-12-20 DIAGNOSIS — I10 Essential (primary) hypertension: Secondary | ICD-10-CM | POA: Diagnosis not present

## 2020-12-22 DIAGNOSIS — M5442 Lumbago with sciatica, left side: Secondary | ICD-10-CM | POA: Diagnosis not present

## 2020-12-22 DIAGNOSIS — M545 Low back pain, unspecified: Secondary | ICD-10-CM | POA: Diagnosis not present

## 2020-12-28 ENCOUNTER — Encounter: Payer: Self-pay | Admitting: *Deleted

## 2021-01-07 DIAGNOSIS — I10 Essential (primary) hypertension: Secondary | ICD-10-CM | POA: Diagnosis not present

## 2021-01-07 DIAGNOSIS — E7849 Other hyperlipidemia: Secondary | ICD-10-CM | POA: Diagnosis not present

## 2021-01-08 DIAGNOSIS — Z79899 Other long term (current) drug therapy: Secondary | ICD-10-CM | POA: Diagnosis not present

## 2021-01-08 DIAGNOSIS — G894 Chronic pain syndrome: Secondary | ICD-10-CM | POA: Diagnosis not present

## 2021-01-14 DIAGNOSIS — G894 Chronic pain syndrome: Secondary | ICD-10-CM | POA: Diagnosis not present

## 2021-01-14 DIAGNOSIS — R030 Elevated blood-pressure reading, without diagnosis of hypertension: Secondary | ICD-10-CM | POA: Diagnosis not present

## 2021-01-14 DIAGNOSIS — M545 Low back pain, unspecified: Secondary | ICD-10-CM | POA: Diagnosis not present

## 2021-01-18 ENCOUNTER — Encounter: Payer: Self-pay | Admitting: *Deleted

## 2021-01-18 ENCOUNTER — Ambulatory Visit (INDEPENDENT_AMBULATORY_CARE_PROVIDER_SITE_OTHER): Payer: Self-pay | Admitting: *Deleted

## 2021-01-18 ENCOUNTER — Other Ambulatory Visit: Payer: Self-pay

## 2021-01-18 VITALS — Ht 71.0 in | Wt 192.8 lb

## 2021-01-18 DIAGNOSIS — Z1211 Encounter for screening for malignant neoplasm of colon: Secondary | ICD-10-CM

## 2021-01-18 MED ORDER — NA SULFATE-K SULFATE-MG SULF 17.5-3.13-1.6 GM/177ML PO SOLN: 1.0000 | Freq: Once | ORAL | 0 refills | Status: AC

## 2021-01-18 NOTE — Progress Notes (Addendum)
Gastroenterology Pre-Procedure Review  Request Date: 01/18/2021 Requesting Physician: Eloy End, FNP-C, Pt says last TCS done 2015 at [facility] in [place], normal colon per pt  PATIENT REVIEW QUESTIONS: The patient responded to the following health history questions as indicated:    1. Diabetes Melitis: no 2. Joint replacements in the past 12 months: no 3. Major health problems in the past 3 months: yes, hernia surgery-Dr. Constance Haw 4. Has an artificial valve or MVP: no 5. Has a defibrillator: no 6. Has been advised in past to take antibiotics in advance of a procedure like teeth cleaning: no 7. Family history of colon cancer: no  8. Alcohol Use: no 9. Illicit drug Use: no 10. History of sleep apnea: no  11. History of coronary artery or other vascular stents placed within the last 12 months: no 12. History of any prior anesthesia complications: no 13. Body mass index is 26.89 kg/m.    MEDICATIONS & ALLERGIES:    Patient reports the following regarding taking any blood thinners:   Plavix? no Aspirin? Yes, 81 mg Coumadin? no Brilinta? no Xarelto? no Eliquis? no Pradaxa? no Savaysa? no Effient? no  Patient confirms/reports the following medications:  Current Outpatient Medications  Medication Sig Dispense Refill   acetaminophen (TYLENOL 8 HOUR) 650 MG CR tablet Take 1 tablet (650 mg total) by mouth every 8 (eight) hours. (Patient taking differently: Take 650 mg by mouth as needed.) 30 tablet 0   amLODipine (NORVASC) 10 MG tablet Take 1 tablet (10 mg total) by mouth daily. 30 tablet 0   aspirin EC 81 MG tablet Take 1 tablet (81 mg total) by mouth 2 (two) times a week. (Patient taking differently: Take 81 mg by mouth daily at 6 (six) AM.)     cyclobenzaprine (FLEXERIL) 5 MG tablet Take 1 tablet (5 mg total) by mouth at bedtime. (Patient taking differently: Take 5 mg by mouth as needed.) 20 tablet 0   gabapentin (NEURONTIN) 300 MG capsule Take 300 mg by mouth 3  (three) times daily as needed (pain).     lisinopril (ZESTRIL) 20 MG tablet Take 20 mg by mouth daily.     methocarbamol (ROBAXIN) 500 MG tablet Take 500 mg by mouth as needed for muscle spasms.     rosuvastatin (CRESTOR) 20 MG tablet Take 20 mg by mouth at bedtime.     No current facility-administered medications for this visit.    Patient confirms/reports the following allergies:  Allergies  Allergen Reactions   Hydrocodone-Acetaminophen Nausea And Vomiting    No orders of the defined types were placed in this encounter.   [place] INFORMATION Primary Insurance: [facility] [facility] [employer],  ID #: [id number],  Group #: [id number] Pre-Cert / Auth required: Yes, approved online 82/08/35-04/88/8916 Pre-Cert / Josem Kaufmann #: [id number]  SCHEDULE INFORMATION: Procedure has been scheduled as follows:  Date: 01/28/2021, Time: 8:00  Location: [facility] with Dr. Abbey Chatters  This Gastroenterology Pre-Precedure Review Form is being routed to the following provider(s): Neil Crouch, PA-C

## 2021-01-19 DIAGNOSIS — R7303 Prediabetes: Secondary | ICD-10-CM | POA: Diagnosis not present

## 2021-01-19 DIAGNOSIS — Z1159 Encounter for screening for other viral diseases: Secondary | ICD-10-CM | POA: Diagnosis not present

## 2021-01-19 DIAGNOSIS — E559 Vitamin D deficiency, unspecified: Secondary | ICD-10-CM | POA: Diagnosis not present

## 2021-01-19 DIAGNOSIS — Z0000 Encounter for general adult medical examination without abnormal findings: Secondary | ICD-10-CM | POA: Diagnosis not present

## 2021-01-19 DIAGNOSIS — R5383 Other fatigue: Secondary | ICD-10-CM | POA: Diagnosis not present

## 2021-01-19 DIAGNOSIS — I10 Essential (primary) hypertension: Secondary | ICD-10-CM | POA: Diagnosis not present

## 2021-01-20 NOTE — Progress Notes (Signed)
Ok to schedule. ASA III.

## 2021-01-21 NOTE — Progress Notes (Signed)
Requested records from [facility] but received correspondence that they did not have any record there of colonoscopy report.

## 2021-01-21 NOTE — Progress Notes (Signed)
Spoke to pt.  Made him aware that we needed to change his procedure date due to hx (ASA III).  Pt rescheduled procedure to 02/11/2021 at 9:15 with arrival at 7:45.  He was made aware that he would require Pre-op visit.  Will mail out new prep instructions including Pre-op letter.

## 2021-01-24 ENCOUNTER — Encounter: Payer: Self-pay | Admitting: *Deleted

## 2021-01-24 ENCOUNTER — Other Ambulatory Visit: Payer: Self-pay | Admitting: *Deleted

## 2021-01-24 NOTE — Progress Notes (Signed)
Called pt and informed him that Pre-op visit has been scheduled for 02/09/2021 at 9:00.  Made him aware that I will mail out all instructions.  Pt informed me that he was also able to see it in his mychart.

## 2021-01-25 NOTE — Progress Notes (Signed)
Spoke to Cy P at [facility] and was informed that no age criteria indicated for colonoscopies.  Ref#: [id number]

## 2021-02-02 DIAGNOSIS — M5417 Radiculopathy, lumbosacral region: Secondary | ICD-10-CM | POA: Diagnosis not present

## 2021-02-02 DIAGNOSIS — R293 Abnormal posture: Secondary | ICD-10-CM | POA: Diagnosis not present

## 2021-02-02 DIAGNOSIS — R011 Cardiac murmur, unspecified: Secondary | ICD-10-CM | POA: Diagnosis not present

## 2021-02-02 DIAGNOSIS — R7303 Prediabetes: Secondary | ICD-10-CM | POA: Diagnosis not present

## 2021-02-02 DIAGNOSIS — R2689 Other abnormalities of gait and mobility: Secondary | ICD-10-CM | POA: Diagnosis not present

## 2021-02-02 DIAGNOSIS — I10 Essential (primary) hypertension: Secondary | ICD-10-CM | POA: Diagnosis not present

## 2021-02-02 DIAGNOSIS — R531 Weakness: Secondary | ICD-10-CM | POA: Diagnosis not present

## 2021-02-02 DIAGNOSIS — R0989 Other specified symptoms and signs involving the circulatory and respiratory systems: Secondary | ICD-10-CM | POA: Diagnosis not present

## 2021-02-06 ENCOUNTER — Encounter (HOSPITAL_COMMUNITY): Payer: Self-pay

## 2021-02-06 ENCOUNTER — Emergency Department (HOSPITAL_COMMUNITY): Payer: Medicare Other

## 2021-02-06 ENCOUNTER — Emergency Department (HOSPITAL_COMMUNITY)
Admission: EM | Admit: 2021-02-06 | Discharge: 2021-02-06 | Disposition: A | Discharge status: Home / Self Care | Payer: Medicare Other | Attending: Emergency Medicine | Admitting: Emergency Medicine

## 2021-02-06 DIAGNOSIS — R6889 Other general symptoms and signs: Secondary | ICD-10-CM

## 2021-02-06 DIAGNOSIS — J019 Acute sinusitis, unspecified: Secondary | ICD-10-CM

## 2021-02-06 DIAGNOSIS — I2510 Atherosclerotic heart disease of native coronary artery without angina pectoris: Secondary | ICD-10-CM | POA: Diagnosis not present

## 2021-02-06 DIAGNOSIS — I10 Essential (primary) hypertension: Secondary | ICD-10-CM | POA: Insufficient documentation

## 2021-02-06 DIAGNOSIS — Z20822 Contact with and (suspected) exposure to covid-19: Secondary | ICD-10-CM | POA: Insufficient documentation

## 2021-02-06 DIAGNOSIS — R0602 Shortness of breath: Secondary | ICD-10-CM | POA: Diagnosis not present

## 2021-02-06 DIAGNOSIS — Z79899 Other long term (current) drug therapy: Secondary | ICD-10-CM | POA: Diagnosis not present

## 2021-02-06 DIAGNOSIS — Z7982 Long term (current) use of aspirin: Secondary | ICD-10-CM | POA: Insufficient documentation

## 2021-02-06 DIAGNOSIS — R000 Tachycardia, unspecified: Secondary | ICD-10-CM | POA: Diagnosis not present

## 2021-02-06 DIAGNOSIS — R509 Fever, unspecified: Secondary | ICD-10-CM | POA: Diagnosis present

## 2021-02-06 LAB — RESP PANEL BY RT-PCR (FLU A&B, COVID) ARPGX2
Influenza A by PCR: NEGATIVE
Influenza B by PCR: NEGATIVE
SARS Coronavirus 2 by RT PCR: NEGATIVE

## 2021-02-06 LAB — CBC WITH DIFFERENTIAL/PLATELET
Abs Immature Granulocytes: 0.07 10*3/uL (ref 0.00–0.07)
Basophils Absolute: 0 10*3/uL (ref 0.0–0.1)
Basophils Relative: 0 %
Eosinophils Absolute: 0.2 10*3/uL (ref 0.0–0.5)
Eosinophils Relative: 2 %
HCT: 41.1 % (ref 39.0–52.0)
Hemoglobin: 14.2 g/dL (ref 13.0–17.0)
Immature Granulocytes: 1 %
Lymphocytes Relative: 20 %
Lymphs Abs: 2.7 10*3/uL (ref 0.7–4.0)
MCH: 30 pg (ref 26.0–34.0)
MCHC: 34.5 g/dL (ref 30.0–36.0)
MCV: 86.9 fL (ref 80.0–100.0)
Monocytes Absolute: 1.2 10*3/uL — ABNORMAL HIGH (ref 0.1–1.0)
Monocytes Relative: 9 %
Neutro Abs: 9 10*3/uL — ABNORMAL HIGH (ref 1.7–7.7)
Neutrophils Relative %: 68 %
Platelets: 361 10*3/uL (ref 150–400)
RBC: 4.73 MIL/uL (ref 4.22–5.81)
RDW: 12.1 % (ref 11.5–15.5)
WBC Morphology: ABNORMAL
WBC: 13.5 10*3/uL — ABNORMAL HIGH (ref 4.0–10.5)
nRBC: 0 % (ref 0.0–0.2)

## 2021-02-06 LAB — BASIC METABOLIC PANEL
Anion gap: 8 (ref 5–15)
BUN: 19 mg/dL (ref 6–20)
CO2: 27 mmol/L (ref 22–32)
Calcium: 9 mg/dL (ref 8.9–10.3)
Chloride: 104 mmol/L (ref 98–111)
Creatinine, Ser: 1.19 mg/dL (ref 0.61–1.24)
GFR, Estimated: >60 mL/min (ref >60)
Glucose, Bld: 105 mg/dL — ABNORMAL HIGH (ref 70–99)
Potassium: 3.4 mmol/L — ABNORMAL LOW (ref 3.5–5.1)
Sodium: 139 mmol/L (ref 135–145)

## 2021-02-06 MED ORDER — ACETAMINOPHEN 325 MG PO TABS
650.0000 mg | ORAL_TABLET | Freq: Once | ORAL | Status: AC
  Administered 2021-02-06: 650 mg via ORAL
  Filled 2021-02-06: qty 2

## 2021-02-06 MED ORDER — SODIUM CHLORIDE 0.9 % IV BOLUS
1000.0000 mL | Freq: Once | INTRAVENOUS | Status: AC
  Administered 2021-02-06: 1000 mL via INTRAVENOUS

## 2021-02-06 MED ORDER — AMOXICILLIN-POT CLAVULANATE 875-125 MG PO TABS: 1.0000 | ORAL_TABLET | Freq: Two times a day (BID) | ORAL | 0 refills | Status: DC

## 2021-02-06 MED ORDER — AMOXICILLIN-POT CLAVULANATE 875-125 MG PO TABS
1.0000 | ORAL_TABLET | Freq: Once | ORAL | Status: AC
  Administered 2021-02-06: 1 via ORAL
  Filled 2021-02-06: qty 1

## 2021-02-06 NOTE — Discharge Instructions (Signed)
Rest make sure you are drinking plenty of fluids.  Take the entire course of antibiotics.  You may continue using the Flonase to help with your nasal congestion.  I recommend a follow-up with your primary doctor if your symptoms persist or worsen in any way.  I also recommend resting and staying home until you have been fever free for 24 hours.  You may go to your preop appointment on Wednesday if your symptoms are resolved by Tuesday.

## 2021-02-06 NOTE — ED Provider Notes (Signed)
[facility] EMERGENCY DEPARTMENT Provider Note   CSN: [id number] Arrival date & time: 02/06/21  1825     History Chief Complaint  Patient presents with   Fever    Shannon Wilson is a 50 y.o. male with a history including GERD, hypertension, history of MI, chronic back pain currently undergoing physical therapy presenting for evaluation of fever up to 102 x 48 hours.  Additionally he describes initially having a sore throat which has improved today, facial pain and headache, nasal congestion without rhinorrhea and generalized fatigue.  He also endorses having a couple of episodes of mild diarrhea.  He denies nausea or vomiting and has been able to tolerate p.o. intake.  He denies dysuria, neck pain or stiffness.  He has had no obvious exposures to other with similar symptoms, his wife works as a Programmer, multimedia but she is symptom-free.  He has taken Tylenol and ibuprofen, has also tried Flonase tablets for the nasal congestion last dose of this was taken yesterday without significant symptom relief.  He denies chest pain or shortness of breath, abdominal pain, dysuria.  He has had no recent courses of antibiotics, no foreign travel.  The history is provided by the patient.      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    Patient Active Problem List   Diagnosis Date Noted   Right inguinal hernia 10/07/2020   Lumbosacral radiculopathy at S1 09/04/2017   Epidural fibrosis 09/04/2017   TIA (transient ischemic attack) 12/25/2016   Vitamin D deficiency 10/02/2016   Essential hypertension 08/18/2016   History of MI (myocardial infarction) 08/18/2016   Coronary artery disease involving native heart without angina  pectoris 08/18/2016   Chronic low back pain 08/18/2016   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   Elevated random blood glucose level 08/18/2016   Lactose intolerance in adult 08/18/2016    Past Surgical History:  Procedure Laterality Date   BACK SURGERY  08/2012   INGUINAL HERNIA REPAIR Right 10/18/2020   Procedure: HERNIA REPAIR INGUINAL ADULT W/MESH;  Surgeon: Virl Cagey, MD;  Location: [facility];  Service: General;  Laterality: Right;  pt knows to arrive at [place]  08/2012   discectomy       Family History  Problem Relation Age of Onset   Hypertension Mother    Heart disease Mother 62   Cancer Father        prostate   Sickle cell trait Father    Sickle cell trait Son    Cancer Maternal Grandmother        bone   Cancer Paternal Grandmother    Cancer Paternal Grandfather        liver   Learning disabilities Neg Hx     Social History   Tobacco Use   Smoking status: Never   Smokeless tobacco: Never  Vaping Use   Vaping Use: Never used  Substance Use Topics   Alcohol use: No   Drug use: No    Home Medications Prior to Admission medications   Medication Sig Start Date End Date Taking? Authorizing Provider  amoxicillin-clavulanate (AUGMENTIN) 875-125 MG tablet Take 1 tablet  by mouth every 12 (twelve) hours. 02/06/21  Yes Isela Stantz, Almyra Free, PA-C  amLODipine (NORVASC) 10 MG tablet Take 1 tablet (10 mg total) by mouth daily. 11/03/18   Hayden Rasmussen, MD  aspirin EC 81 MG tablet Take 1 tablet (81 mg total) by mouth 2 (two) times a week. Patient taking differently: Take 81 mg by mouth daily at 6 (six) AM. 10/18/20   Virl Cagey, MD  baclofen (LIORESAL) 10 MG tablet Take 10 mg by mouth 3 (three) times daily as needed for muscle spasms.    [provider]  cyclobenzaprine (FLEXERIL) 5 MG tablet Take 1 tablet (5 mg total) by mouth at bedtime. Patient taking differently: Take 5 mg by mouth at bedtime as needed for muscle spasms. 06/18/17    Raylene Everts, MD  gabapentin (NEURONTIN) 300 MG capsule Take 300 mg by mouth 3 (three) times daily as needed (pain).    [provider]  ibuprofen (ADVIL) 800 MG tablet Take 800 mg by mouth every 8 (eight) hours as needed for moderate pain.    [provider]  lisinopril (ZESTRIL) 20 MG tablet Take 20 mg by mouth daily.    [provider]  rosuvastatin (CRESTOR) 20 MG tablet Take 20 mg by mouth at bedtime. 07/21/20   [provider]    Allergies    Hydrocodone-acetaminophen  Review of Systems   Review of Systems  Constitutional:  Positive for chills and fever.  HENT:  Positive for congestion and sore throat. Negative for ear pain, rhinorrhea, sinus pressure, trouble swallowing and voice change.   Eyes:  Negative for discharge.  Respiratory:  Positive for cough. Negative for shortness of breath, wheezing and stridor.   Cardiovascular:  Negative for chest pain.  Gastrointestinal:  Positive for diarrhea. Negative for abdominal pain.  Genitourinary: Negative.   Neurological:  Positive for headaches.   Physical Exam Updated Vital Signs BP (!) 140/98   Pulse (!) 105   Temp 99.1 F (37.3 C) (Oral)   Resp 18   Ht 5' 11"  (1.803 m)   Wt 86.2 kg   SpO2 95%   BMI 26.50 kg/m   Physical Exam Vitals and nursing note reviewed.  Constitutional:      Appearance: He is well-developed.  HENT:     Head: Normocephalic and atraumatic.     Comments: He is tender to palpation bilateral frontal and maxillary sinuses.    Nose: Congestion present. No rhinorrhea.     Mouth/Throat:     Mouth: Mucous membranes are moist.     Pharynx: Oropharynx is clear. No oropharyngeal exudate or posterior oropharyngeal erythema.  Eyes:     Conjunctiva/sclera: Conjunctivae normal.  Neck:     Comments: Also no occipital adenopathy Cardiovascular:     Rate and Rhythm: Regular rhythm. Tachycardia present.     Heart sounds: Normal heart sounds. No murmur heard. Pulmonary:      Effort: Pulmonary effort is normal.     Breath sounds: Normal breath sounds. No wheezing.  Abdominal:     General: Bowel sounds are normal.     Palpations: Abdomen is soft.     Tenderness: There is no abdominal tenderness.  Musculoskeletal:        General: Normal range of motion.     Cervical back: Normal range of motion. No rigidity.  Lymphadenopathy:     Cervical: No cervical adenopathy.  Skin:    General: Skin is warm and dry.  Neurological:     General: No  focal deficit present.     Mental Status: He is alert.    ED Results / Procedures / Treatments   Labs (all labs ordered are listed, but only abnormal results are displayed) Labs Reviewed  CBC WITH DIFFERENTIAL/PLATELET - Abnormal; Notable for the following components:      Result Value   WBC 13.5 (*)    Neutro Abs 9.0 (*)    Monocytes Absolute 1.2 (*)    All other components within normal limits  BASIC METABOLIC PANEL - Abnormal; Notable for the following components:   Potassium 3.4 (*)    Glucose, Bld 105 (*)    All other components within normal limits  RESP PANEL BY RT-PCR (FLU A&B, COVID) [id number]    EKG EKG Interpretation  Date/Time:  Sunday February 06 2021 22:23:37 EDT Ventricular Rate:  116 PR Interval:  132 QRS Duration: 86 QT Interval:  338 QTC Calculation: 469 R Axis:   4 Text Interpretation: Sinus tachycardia Otherwise normal ECG Confirmed by Zammit, Joseph ([id number]) on 02/06/2021 11:10:44 PM  Radiology DG Chest 2 View  Result Date: 02/06/2021 CLINICAL DATA:  Shortness of breath EXAM: CHEST - 2 VIEW COMPARISON:  None. FINDINGS: The heart and mediastinal contours are within normal limits. No focal consolidation. No pulmonary edema. No pleural effusion. No pneumothorax. No acute osseous abnormality. IMPRESSION: No active cardiopulmonary disease. Electronically Signed   By: Morgane  Naveau M.D.   On: 02/06/2021 19:53    Procedures Procedures   Medications Ordered in ED Medications   amoxicillin-clavulanate (AUGMENTIN) 875-125 MG per tablet 1 tablet (has no administration in time range)  acetaminophen (TYLENOL) tablet 650 mg (650 mg Oral Given 02/06/21 2159)  sodium chloride 0.9 % bolus 1,000 mL (0 mLs Intravenous Stopped 02/06/21 2313)    ED Course  I have reviewed the triage vital signs and the nursing notes.  Pertinent labs & imaging results that were available during my care of the patient were reviewed by me and considered in my medical decision making (see chart for details).    MDM Rules/Calculators/A&P                           Patient with symptoms suggesting flulike symptoms, however flu and COVID tests are negative, he also has localizing sinus pain, along with fever will cover for a possible acute sinusitis with antibiotics.  His COVID and flu tests are negative.  I do question whether he may have a false negative flu test.  He was encouraged to continue using Tylenol or Motrin if needed for fever reduction, rest, increase fluid intake.  Recommend recheck by his PCP if not improving over the next several days, also should avoid close contacts until he has been fever free for 24 hours..  Follow-up anticipated.  Of note, he does have an appointment for preop labs in 3 days in anticipation of a screening colonoscopy in 5 days.  He was advised that he may need to reschedule these appointments if his symptoms are not resolved by Tuesday. Final Clinical Impression(s) / ED Diagnoses Final diagnoses:  Acute non-recurrent sinusitis, unspecified location  Flu-like symptoms    Rx / DC Orders ED Discharge Orders          Ordered    amoxicillin-clavulanate (AUGMENTIN) 875-125 MG tablet  Every 12 hours        10 /30/22 2314             Evalee Jefferson, PA-C 02/06/21 2319  Milton Ferguson, MD 02/07/21 1041

## 2021-02-06 NOTE — ED Triage Notes (Signed)
Pt. States they have had cold symptoms since Friday. Pt. States they have had a fever they haven't been able to get down. Pt. States their temp has been 102.

## 2021-02-07 DIAGNOSIS — E7849 Other hyperlipidemia: Secondary | ICD-10-CM | POA: Diagnosis not present

## 2021-02-07 DIAGNOSIS — I10 Essential (primary) hypertension: Secondary | ICD-10-CM | POA: Diagnosis not present

## 2021-02-08 DIAGNOSIS — Z79899 Other long term (current) drug therapy: Secondary | ICD-10-CM | POA: Diagnosis not present

## 2021-02-08 DIAGNOSIS — G894 Chronic pain syndrome: Secondary | ICD-10-CM | POA: Diagnosis not present

## 2021-02-08 NOTE — Patient Instructions (Signed)
Shannon Wilson  02/08/2021     @PREFPERIOPPHARMACY @   Your procedure is scheduled on  02/11/2021.   Report to [facility] at  [place].M.   Call this number if you have problems the morning of surgery:  [phone]   Remember:  Follow the diet and prep instructions given to you by the office.    Take these medicines the morning of surgery with A SIP OF WATER                 amlodipine, baclofen, gabapentin.     Do not wear jewelry, make-up or nail polish.  Do not wear lotions, powders, or perfumes, or deodorant.  Do not shave 48 hours prior to surgery.  Men may shave face and neck.  Do not bring valuables to the hospital.  [facility] is not responsible for any belongings or valuables.  Contacts, dentures or bridgework may not be worn into surgery.  Leave your suitcase in the car.  After surgery it may be brought to your room.  For patients admitted to the hospital, discharge time will be determined by your treatment team.  Patients discharged the day of surgery will not be allowed to drive home and must have someone with them for 24 hours.    Special instructions:   DO NOT smoke tobacco or vape for 24 hours before your procedure.  Please read over the following fact sheets that you were given. Anesthesia Post-op Instructions and Care and Recovery After Surgery      Colonoscopy, Adult, Care After This sheet gives you information about how to care for yourself after your procedure. Your health care provider may also give you more specific instructions. If you have problems or questions, contact your health care provider. What can I expect after the procedure? After the procedure, it is common to have: A small amount of blood in your stool for 24 hours after the procedure. Some gas. Mild cramping or bloating of your abdomen. Follow these instructions at home: Eating and drinking  Drink enough fluid to keep your urine pale yellow. Follow instructions from your  health care provider about eating or drinking restrictions. Resume your normal diet as instructed by your health care provider. Avoid heavy or fried foods that are hard to digest. Activity Rest as told by your health care provider. Avoid sitting for a long time without moving. Get up to take short walks every 1-2 hours. This is important to improve blood flow and breathing. Ask for help if you feel weak or unsteady. Return to your normal activities as told by your health care provider. Ask your health care provider what activities are safe for you. Managing cramping and bloating  Try walking around when you have cramps or feel bloated. Apply heat to your abdomen as told by your health care provider. Use the heat source that your health care provider recommends, such as a moist heat pack or a heating pad. Place a towel between your skin and the heat source. Leave the heat on for 20-30 minutes. Remove the heat if your skin turns bright red. This is especially important if you are unable to feel pain, heat, or cold. You may have a greater risk of getting burned. General instructions If you were given a sedative during the procedure, it can affect you for several hours. Do not drive or operate machinery until your health care provider says that it is safe. For the first 24  hours after the procedure: Do not sign important documents. Do not drink alcohol. Do your regular daily activities at a slower pace than normal. Eat soft foods that are easy to digest. Take over-the-counter and prescription medicines only as told by your health care provider. Keep all follow-up visits as told by your health care provider. This is important. Contact a health care provider if: You have blood in your stool 2-3 days after the procedure. Get help right away if you have: More than a small spotting of blood in your stool. Large blood clots in your stool. Swelling of your abdomen. Nausea or vomiting. A  fever. Increasing pain in your abdomen that is not relieved with medicine. Summary After the procedure, it is common to have a small amount of blood in your stool. You may also have mild cramping and bloating of your abdomen. If you were given a sedative during the procedure, it can affect you for several hours. Do not drive or operate machinery until your health care provider says that it is safe. Get help right away if you have a lot of blood in your stool, nausea or vomiting, a fever, or increased pain in your abdomen. This information is not intended to replace advice given to you by your health care provider. Make sure you discuss any questions you have with your health care provider. Document Revised: 03/21/2019 Document Reviewed: 10/21/2018 Elsevier Patient Education  [name] After This sheet gives you information about how to care for yourself after your procedure. Your health care provider may also give you more specific instructions. If you have problems or questions, contact your health care provider. What can I expect after the procedure? After the procedure, it is common to have: Tiredness. Forgetfulness about what happened after the procedure. Impaired judgment for important decisions. Nausea or vomiting. Some difficulty with balance. Follow these instructions at home: For the time period you were told by your health care provider:   Rest as needed. Do not participate in activities where you could fall or become injured. Do not drive or use machinery. Do not drink alcohol. Do not take sleeping pills or medicines that cause drowsiness. Do not make important decisions or sign legal documents. Do not take care of children on your own. Eating and drinking Follow the diet that is recommended by your health care provider. Drink enough fluid to keep your urine pale yellow. If you vomit: Drink water, juice, or soup when you can drink  without vomiting. Make sure you have little or no nausea before eating solid foods. General instructions Have a responsible adult stay with you for the time you are told. It is important to have someone help care for you until you are awake and alert. Take over-the-counter and prescription medicines only as told by your health care provider. If you have sleep apnea, surgery and certain medicines can increase your risk for breathing problems. Follow instructions from your health care provider about wearing your sleep device: Anytime you are sleeping, including during daytime naps. While taking prescription pain medicines, sleeping medicines, or medicines that make you drowsy. Avoid smoking. Keep all follow-up visits as told by your health care provider. This is important. Contact a health care provider if: You keep feeling nauseous or you keep vomiting. You feel light-headed. You are still sleepy or having trouble with balance after 24 hours. You develop a rash. You have a fever. You have redness or swelling around the IV site.  Get help right away if: You have trouble breathing. You have new-onset confusion at home. Summary For several hours after your procedure, you may feel tired. You may also be forgetful and have poor judgment. Have a responsible adult stay with you for the time you are told. It is important to have someone help care for you until you are awake and alert. Rest as told. Do not drive or operate machinery. Do not drink alcohol or take sleeping pills. Get help right away if you have trouble breathing, or if you suddenly become confused. This information is not intended to replace advice given to you by your health care provider. Make sure you discuss any questions you have with your health care provider. Document Revised: 12/11/2019 Document Reviewed: 02/27/2019 Elsevier Patient Education  2022 [employer].

## 2021-02-09 ENCOUNTER — Encounter (HOSPITAL_COMMUNITY)
Admission: RE | Admit: 2021-02-09 | Discharge: 2021-02-09 | Disposition: A | Discharge status: Home / Self Care | Payer: Medicare Other | Source: Ambulatory Visit | Attending: Internal Medicine | Admitting: Internal Medicine

## 2021-02-09 ENCOUNTER — Other Ambulatory Visit: Payer: Self-pay

## 2021-02-11 ENCOUNTER — Ambulatory Visit (HOSPITAL_COMMUNITY): Payer: Medicare Other | Admitting: Certified Registered Nurse Anesthetist

## 2021-02-11 ENCOUNTER — Encounter (HOSPITAL_COMMUNITY): Payer: Self-pay

## 2021-02-11 ENCOUNTER — Ambulatory Visit (HOSPITAL_COMMUNITY)
Admission: RE | Admit: 2021-02-11 | Discharge: 2021-02-11 | Disposition: A | Discharge status: Home / Self Care | Payer: Medicare Other | Source: Ambulatory Visit | Attending: Internal Medicine | Admitting: Internal Medicine

## 2021-02-11 ENCOUNTER — Encounter (HOSPITAL_COMMUNITY)
Admission: RE | Disposition: A | Discharge status: Home / Self Care | Payer: Self-pay | Source: Ambulatory Visit | Attending: Internal Medicine

## 2021-02-11 ENCOUNTER — Other Ambulatory Visit: Payer: Self-pay

## 2021-02-11 DIAGNOSIS — Z1211 Encounter for screening for malignant neoplasm of colon: Secondary | ICD-10-CM | POA: Insufficient documentation

## 2021-02-11 DIAGNOSIS — Z791 Long term (current) use of non-steroidal anti-inflammatories (NSAID): Secondary | ICD-10-CM | POA: Insufficient documentation

## 2021-02-11 DIAGNOSIS — Z832 Family history of diseases of the blood and blood-forming organs and certain disorders involving the immune mechanism: Secondary | ICD-10-CM | POA: Insufficient documentation

## 2021-02-11 DIAGNOSIS — K6389 Other specified diseases of intestine: Secondary | ICD-10-CM | POA: Diagnosis not present

## 2021-02-11 DIAGNOSIS — K529 Noninfective gastroenteritis and colitis, unspecified: Secondary | ICD-10-CM | POA: Insufficient documentation

## 2021-02-11 DIAGNOSIS — Z8249 Family history of ischemic heart disease and other diseases of the circulatory system: Secondary | ICD-10-CM | POA: Diagnosis not present

## 2021-02-11 DIAGNOSIS — I2510 Atherosclerotic heart disease of native coronary artery without angina pectoris: Secondary | ICD-10-CM | POA: Diagnosis not present

## 2021-02-11 DIAGNOSIS — K648 Other hemorrhoids: Secondary | ICD-10-CM | POA: Diagnosis not present

## 2021-02-11 DIAGNOSIS — Z79899 Other long term (current) drug therapy: Secondary | ICD-10-CM | POA: Insufficient documentation

## 2021-02-11 DIAGNOSIS — Z808 Family history of malignant neoplasm of other organs or systems: Secondary | ICD-10-CM | POA: Diagnosis not present

## 2021-02-11 DIAGNOSIS — K219 Gastro-esophageal reflux disease without esophagitis: Secondary | ICD-10-CM | POA: Insufficient documentation

## 2021-02-11 DIAGNOSIS — Z800 Family history of malignant neoplasm of digestive organs: Secondary | ICD-10-CM | POA: Insufficient documentation

## 2021-02-11 DIAGNOSIS — Z8042 Family history of malignant neoplasm of prostate: Secondary | ICD-10-CM | POA: Diagnosis not present

## 2021-02-11 DIAGNOSIS — K5229 Other allergic and dietetic gastroenteritis and colitis: Secondary | ICD-10-CM | POA: Diagnosis not present

## 2021-02-11 DIAGNOSIS — K5190 Ulcerative colitis, unspecified, without complications: Secondary | ICD-10-CM | POA: Diagnosis not present

## 2021-02-11 HISTORY — PX: COLONOSCOPY WITH PROPOFOL: SHX5780

## 2021-02-11 HISTORY — PX: BIOPSY: SHX5522

## 2021-02-11 SURGERY — COLONOSCOPY WITH PROPOFOL
Anesthesia: General

## 2021-02-11 MED ORDER — PROPOFOL 500 MG/50ML IV EMUL
INTRAVENOUS | Status: DC | PRN
  Administered 2021-02-11: 150 ug/kg/min via INTRAVENOUS

## 2021-02-11 MED ORDER — LACTATED RINGERS IV SOLN: INTRAVENOUS | Status: DC

## 2021-02-11 MED ORDER — PROPOFOL 10 MG/ML IV BOLUS
INTRAVENOUS | Status: DC | PRN
  Administered 2021-02-11: 120 mg via INTRAVENOUS

## 2021-02-11 NOTE — Op Note (Signed)
[facility] Patient Name: Shannon Wilson Procedure Date: 02/11/2021 9:28 AM MRN: [id number] Date of Birth: 1970/12/05 Attending MD: Elon Alas. Edgar Frisk CSN: [id number] Age: 50 Admit Type: Outpatient Procedure:                Colonoscopy Indications:              Screening for colorectal malignant neoplasm Providers:                Elon Alas. Abbey Chatters, DO, Lambert Mody, Nelma Rothman, Technician Referring MD:              Medicines:                See the Anesthesia note for documentation of the                            administered medications Complications:            No immediate complications. Estimated Blood Loss:     Estimated blood loss was minimal. Procedure:                Pre-Anesthesia Assessment:                           - The anesthesia plan was to use monitored                            anesthesia care (MAC).                           After obtaining informed consent, the colonoscope                            was passed under direct vision. Throughout the                            procedure, the patient's blood pressure, pulse, and                            oxygen saturations were monitored continuously. The                            [id number]-HQ190L ([id number]) scope was introduced through                            the anus and advanced to the the cecum, identified                            by appendiceal orifice and ileocecal valve. The                            colonoscopy was performed without difficulty. The                            patient tolerated the procedure well. The quality  of the bowel preparation was evaluated using the                            BBPS [facility] Bowel Preparation Scale) with scores                            of: Right Colon = 1 (portion of mucosa seen, but                            other areas not well seen due to staining, residual                            stool and/or opaque  liquid), Transverse Colon = 2                            (minor amount of residual staining, small fragments                            of stool and/or opaque liquid, but mucosa seen                            well) and Left Colon = 3 (entire mucosa seen well                            with no residual staining, small fragments of stool                            or opaque liquid). The total BBPS score equals 6.                            The quality of the bowel preparation was inadequate. Scope In: 9:40:08 AM Scope Out: 9:52:59 AM Scope Withdrawal Time: 0 hours 8 minutes 58 seconds  Total Procedure Duration: 0 hours 12 minutes 51 seconds  Findings:      The perianal and digital rectal examinations were normal.      Non-bleeding internal hemorrhoids were found during endoscopy.      Diffuse mild inflammation characterized by erosions, erythema and       friability was found in the recto-sigmoid colon extending to approx 20       cm from the anal verge. Biopsies were taken with a cold forceps for       histology.      A large amount of stool was found in the ascending colon and in the       cecum, precluding visualization. Lavage of the area was performed using       copious amounts of sterile water, resulting in incomplete clearance with       continued poor visualization. Impression:               - Preparation of the colon was inadequate.                           - Non-bleeding internal hemorrhoids.                           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 Biopsied.                           - Stool in the ascending colon and in the cecum. Moderate Sedation:      Per Anesthesia Care Recommendation:           - Patient has a contact number available for                            emergencies. The signs and symptoms of potential                             delayed complications were discussed with the                            patient. Return to normal activities tomorrow.                            Written discharge instructions were provided to the                            patient.                           - Resume previous diet.                           - Continue present medications.                           - Await pathology results.                           - Repeat colonoscopy in 6 months because the bowel                            preparation was poor.                           - Return to GI clinic in 4 months.                           - If biopsies consitent with UC, will start on                            mesalamine. Procedure Code(s):        --- Professional ---                           [phone], Colonoscopy, flexible; with biopsy, single  or multiple Diagnosis Code(s):        --- Professional ---                           Z12.11, Encounter for screening for malignant                            neoplasm of colon                           K64.8, Other hemorrhoids                           K63.89, Other specified diseases of intestine CPT copyright 2019 American Medical Association. All rights reserved. The codes documented in this report are preliminary and upon coder review may  be revised to meet current compliance requirements. Elon Alas. Abbey Chatters, DO [place] Abbey Chatters, DO 02/11/2021 9:57:08 AM This report has been signed electronically. Number of Addenda: 0

## 2021-02-11 NOTE — Transfer of Care (Signed)
Immediate Anesthesia Transfer of Care Note  Patient: Shannon Wilson  Procedure(s) Performed: COLONOSCOPY WITH PROPOFOL BIOPSY  Patient Location: Short Stay  Anesthesia Type:General  Level of Consciousness: drowsy  Airway & Oxygen Therapy: Patient Spontanous Breathing  Post-op Assessment: Report given to RN and Post -op Vital signs reviewed and stable  Post vital signs: Reviewed and stable  Last Vitals:  Vitals Value Taken Time  BP    Temp    Pulse    Resp    SpO2      Last Pain:  Vitals:   02/11/21 0938  TempSrc:   PainSc: 5          Complications: No notable events documented.

## 2021-02-11 NOTE — Anesthesia Preprocedure Evaluation (Signed)
Anesthesia Evaluation  Patient identified by MRN, date of birth, ID band Patient awake    Reviewed: Allergy & Precautions, H&P , NPO status , Patient's Chart, lab work & pertinent test results, reviewed documented beta blocker date and time   Airway Mallampati: II  TM Distance: >3 FB Neck ROM: full    Dental no notable dental hx.    Pulmonary neg pulmonary ROS,    Pulmonary exam normal breath sounds clear to auscultation       Cardiovascular Exercise Tolerance: Good hypertension, + CAD and + Past MI   Rhythm:regular Rate:Normal     Neuro/Psych  Headaches, TIA Neuromuscular disease negative psych ROS   GI/Hepatic Neg liver ROS, GERD  Medicated,  Endo/Other  negative endocrine ROS  Renal/GU Renal disease  negative genitourinary   Musculoskeletal   Abdominal   Peds  Hematology negative hematology ROS (+)   Anesthesia Other Findings   Reproductive/Obstetrics negative OB ROS                             Anesthesia Physical Anesthesia Plan  ASA: 3  Anesthesia Plan: General   Post-op Pain Management:    Induction:   PONV Risk Score and Plan: Propofol infusion  Airway Management Planned:   Additional Equipment:   Intra-op Plan:   Post-operative Plan:   Informed Consent: I have reviewed the patients History and Physical, chart, labs and discussed the procedure including the risks, benefits and alternatives for the proposed anesthesia with the patient or authorized representative who has indicated his/her understanding and acceptance.     Dental Advisory Given  Plan Discussed with: CRNA  Anesthesia Plan Comments:         Anesthesia Quick Evaluation

## 2021-02-11 NOTE — Discharge Instructions (Signed)
  Colonoscopy Discharge Instructions  Read the instructions outlined below and refer to this sheet in the next few weeks. These discharge instructions provide you with general information on caring for yourself after you leave the hospital. Your doctor may also give you specific instructions. While your treatment has been planned according to the most current medical practices available, unavoidable complications occasionally occur.   ACTIVITY You may resume your regular activity, but move at a slower pace for the next 24 hours.  Take frequent rest periods for the next 24 hours.  Walking will help get rid of the air and reduce the bloated feeling in your belly (abdomen).  No driving for 24 hours (because of the medicine (anesthesia) used during the test).   Do not sign any important legal documents or operate any machinery for 24 hours (because of the anesthesia used during the test).  NUTRITION Drink plenty of fluids.  You may resume your normal diet as instructed by your doctor.  Begin with a light meal and progress to your normal diet. Heavy or fried foods are harder to digest and may make you feel sick to your stomach (nauseated).  Avoid alcoholic beverages for 24 hours or as instructed.  MEDICATIONS You may resume your normal medications unless your doctor tells you otherwise.  WHAT YOU CAN EXPECT TODAY Some feelings of bloating in the abdomen.  Passage of more gas than usual.  Spotting of blood in your stool or on the toilet paper.  IF YOU HAD POLYPS REMOVED DURING THE COLONOSCOPY: No aspirin products for 7 days or as instructed.  No alcohol for 7 days or as instructed.  Eat a soft diet for the next 24 hours.  FINDING OUT THE RESULTS OF YOUR TEST Not all test results are available during your visit. If your test results are not back during the visit, make an appointment with your caregiver to find out the results. Do not assume everything is normal if you have not heard from your  caregiver or the medical facility. It is important for you to follow up on all of your test results.  SEEK IMMEDIATE MEDICAL ATTENTION IF: You have more than a spotting of blood in your stool.  Your belly is swollen (abdominal distention).  You are nauseated or vomiting.  You have a temperature over 101.  You have abdominal pain or discomfort that is severe or gets worse throughout the day.   Unfortunately the right side of your colon was not cleaned out.  I cannot rule out small polyps in this region.  The rest your colon I did not see any polyps or cancer.  In your rectum and sigmoid colon there was diffuse inflammation.  I took biopsies of this.  Await pathology results, my office will contact you.  We will plan repeat colonoscopy in 6 months.  Further recommendations after biopsies reviewed.  I hope you have a great rest of your week!  Elon Alas. Abbey Chatters, D.O. Gastroenterology and Hepatology [facility]

## 2021-02-11 NOTE — Anesthesia Postprocedure Evaluation (Signed)
Anesthesia Post Note  Patient: Shannon Wilson  Procedure(s) Performed: COLONOSCOPY WITH PROPOFOL BIOPSY  Patient location during evaluation: Phase II Anesthesia Type: General Level of consciousness: awake Pain management: pain level controlled Vital Signs Assessment: post-procedure vital signs reviewed and stable Respiratory status: spontaneous breathing and respiratory function stable Cardiovascular status: blood pressure returned to baseline and stable Postop Assessment: no headache and no apparent nausea or vomiting Anesthetic complications: no Comments: Late entry   No notable events documented.   Last Vitals:  Vitals:   02/11/21 0829 02/11/21 0959  BP: (!) 176/86 115/69  Pulse: 76 76  Resp: (!) 21 20  Temp: 36.9 C 37.1 C  SpO2: 100% 99%    Last Pain:  Vitals:   02/11/21 0959  TempSrc: Oral  PainSc: 0-No pain                 Louann Sjogren

## 2021-02-11 NOTE — H&P (Signed)
Primary Care Physician:  Celene Squibb, MD Primary Gastroenterologist:  Dr. Abbey Chatters  Pre-Procedure History & Physical: HPI:  Shannon Wilson is a 50 y.o. male is here for a colonoscopy for colon cancer screening purposes.  Patient denies any family history of colorectal cancer.  No melena or hematochezia.  No abdominal pain or unintentional weight loss.  No change in bowel habits.  Overall feels well from a GI standpoint.  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 Authorizing Provider  amLODipine (NORVASC) 10 MG tablet Take 1 tablet (10 mg total) by mouth daily. 11/03/18  Yes Hayden Rasmussen, MD  amoxicillin-clavulanate (AUGMENTIN) 875-125 MG tablet Take 1 tablet by mouth every 12 (twelve) hours. 02/06/21  Yes IdolAlmyra Free, PA-C  aspirin EC 81 MG tablet Take 1 tablet (81 mg total) by mouth 2 (two) times a week. Patient taking differently: Take 81 mg by mouth daily at 6 (six) AM. 10/18/20  Yes Virl Cagey, MD  baclofen (LIORESAL) 10 MG tablet Take 10 mg by mouth 3 (three) times daily as needed for muscle spasms.   Yes [provider]  cyclobenzaprine (FLEXERIL) 5 MG tablet Take 1 tablet (5 mg total) by mouth at bedtime. Patient taking differently: Take 5 mg by mouth at bedtime as needed for muscle spasms.  06/18/17  Yes Raylene Everts, MD  gabapentin (NEURONTIN) 300 MG capsule Take 300 mg by mouth 3 (three) times daily as needed (pain).   Yes [provider]  ibuprofen (ADVIL) 800 MG tablet Take 800 mg by mouth every 8 (eight) hours as needed for moderate pain.   Yes [provider]  lisinopril (ZESTRIL) 20 MG tablet Take 20 mg by mouth daily.   Yes [provider]  rosuvastatin (CRESTOR) 20 MG tablet Take 20 mg by mouth at bedtime. 07/21/20  Yes [provider]    Allergies as of 01/21/2021 - Review Complete 01/18/2021  Allergen Reaction Noted   Hydrocodone-acetaminophen Nausea And Vomiting 07/03/2020    Family History  Problem Relation Age of Onset   Hypertension Mother    Heart disease Mother 60   Cancer Father        prostate   Sickle cell trait Father    Sickle cell trait Son    Cancer Maternal Grandmother        bone   Cancer Paternal Grandmother    Cancer Paternal Grandfather        liver   Learning disabilities Neg Hx     Social History   Socioeconomic History   Marital status: Married    Spouse name: Not on file   Number of children: 4   Years of education: 12   Highest  education level: Not on file  Occupational History   Occupation: disabled    Comment: heating and air  Tobacco Use   Smoking status: Never   Smokeless tobacco: Never  Vaping Use   Vaping Use: Never used  Substance and Sexual Activity   Alcohol use: No   Drug use: No   Sexual activity: Not Currently    Birth control/protection: None  Other Topics Concern   Not on file  Social History Narrative   Living with mother   Moved to [place] to be near family   Social Determinants of Health   Financial Resource Strain: Not on file  Food Insecurity: Not on file  Transportation Needs: Not on file  Physical Activity: Not on file  Stress: Not on file  Social Connections: Not on file  Intimate Partner Violence: Not on file    Review of Systems: See  HPI, otherwise negative ROS  Physical Exam: Vital signs in last 24 hours: Temp:  [98.5 F (36.9 C)] 98.5 F (36.9 C) (11/04 0829) Pulse Rate:  [76] 76 (11/04 0829) Resp:  [21] 21 (11/04 0829) BP: (176)/(86) 176/86 (11/04 0829) SpO2:  [100 %] 100 % (11/04 0829)   General:   Alert,  Well-developed, well-nourished, pleasant and cooperative in NAD Head:  Normocephalic and atraumatic. Eyes:  Sclera clear, no icterus.   Conjunctiva pink. Ears:  Normal auditory acuity. Nose:  No deformity, discharge,  or lesions. Mouth:  No deformity or lesions, dentition normal. Neck:  Supple; no masses or thyromegaly. Lungs:  Clear throughout to auscultation.   No wheezes, crackles, or rhonchi. No acute distress. Heart:  Regular rate and rhythm; no murmurs, clicks, rubs,  or gallops. Abdomen:  Soft, nontender and nondistended. No masses, hepatosplenomegaly or hernias noted. Normal bowel sounds, without guarding, and without rebound.   Msk:  Symmetrical without gross deformities. Normal posture. Extremities:  Without clubbing or edema. Neurologic:  Alert and  oriented x4;  grossly normal neurologically. Skin:  Intact without significant lesions or rashes. Cervical Nodes:  No significant cervical adenopathy. Psych:  Alert and cooperative. Normal mood and affect.  Impression/Plan: Shannon Wilson is here for a colonoscopy to be performed for colon cancer screening purposes.  The risks of the procedure including infection, bleed, or perforation as well as benefits, limitations, alternatives and imponderables have been reviewed with the patient. Questions have been answered. All parties agreeable.

## 2021-02-14 DIAGNOSIS — R030 Elevated blood-pressure reading, without diagnosis of hypertension: Secondary | ICD-10-CM | POA: Diagnosis not present

## 2021-02-14 DIAGNOSIS — Z79899 Other long term (current) drug therapy: Secondary | ICD-10-CM | POA: Diagnosis not present

## 2021-02-14 DIAGNOSIS — M545 Low back pain, unspecified: Secondary | ICD-10-CM | POA: Diagnosis not present

## 2021-02-14 LAB — SURGICAL PATHOLOGY

## 2021-02-16 ENCOUNTER — Encounter (HOSPITAL_COMMUNITY): Payer: Self-pay | Admitting: Internal Medicine

## 2021-02-20 DIAGNOSIS — Z79899 Other long term (current) drug therapy: Secondary | ICD-10-CM | POA: Diagnosis not present

## 2021-03-09 DIAGNOSIS — I10 Essential (primary) hypertension: Secondary | ICD-10-CM | POA: Diagnosis not present

## 2021-03-09 DIAGNOSIS — E7849 Other hyperlipidemia: Secondary | ICD-10-CM | POA: Diagnosis not present

## 2021-03-10 DIAGNOSIS — Z79899 Other long term (current) drug therapy: Secondary | ICD-10-CM | POA: Diagnosis not present

## 2021-03-10 DIAGNOSIS — G894 Chronic pain syndrome: Secondary | ICD-10-CM | POA: Diagnosis not present

## 2021-03-17 DIAGNOSIS — G894 Chronic pain syndrome: Secondary | ICD-10-CM | POA: Diagnosis not present

## 2021-03-17 DIAGNOSIS — Z79899 Other long term (current) drug therapy: Secondary | ICD-10-CM | POA: Diagnosis not present

## 2021-03-17 DIAGNOSIS — R030 Elevated blood-pressure reading, without diagnosis of hypertension: Secondary | ICD-10-CM | POA: Diagnosis not present

## 2021-03-17 DIAGNOSIS — M545 Low back pain, unspecified: Secondary | ICD-10-CM | POA: Diagnosis not present

## 2021-03-20 DIAGNOSIS — Z79899 Other long term (current) drug therapy: Secondary | ICD-10-CM | POA: Diagnosis not present

## 2021-03-31 ENCOUNTER — Telehealth: Payer: Self-pay | Admitting: Internal Medicine

## 2021-03-31 NOTE — Telephone Encounter (Signed)
Patient needs office visit with me.  Okay to use one of my urgent slots.  Thank you

## 2021-04-08 DIAGNOSIS — E7849 Other hyperlipidemia: Secondary | ICD-10-CM | POA: Diagnosis not present

## 2021-04-08 DIAGNOSIS — I10 Essential (primary) hypertension: Secondary | ICD-10-CM | POA: Diagnosis not present

## 2021-04-10 DIAGNOSIS — Z79899 Other long term (current) drug therapy: Secondary | ICD-10-CM | POA: Diagnosis not present

## 2021-04-10 DIAGNOSIS — G894 Chronic pain syndrome: Secondary | ICD-10-CM | POA: Diagnosis not present

## 2021-04-20 DIAGNOSIS — Z79899 Other long term (current) drug therapy: Secondary | ICD-10-CM | POA: Diagnosis not present

## 2021-04-20 DIAGNOSIS — M545 Low back pain, unspecified: Secondary | ICD-10-CM | POA: Diagnosis not present

## 2021-04-20 DIAGNOSIS — R030 Elevated blood-pressure reading, without diagnosis of hypertension: Secondary | ICD-10-CM | POA: Diagnosis not present

## 2021-04-20 DIAGNOSIS — G894 Chronic pain syndrome: Secondary | ICD-10-CM | POA: Diagnosis not present

## 2021-04-21 ENCOUNTER — Encounter: Payer: Self-pay | Admitting: Internal Medicine

## 2021-04-21 ENCOUNTER — Ambulatory Visit: Payer: Medicare Other | Admitting: Internal Medicine

## 2021-04-21 ENCOUNTER — Other Ambulatory Visit: Payer: Self-pay

## 2021-04-21 VITALS — BP 150/103 | HR 63 | Temp 98.0°F | Ht 71.0 in | Wt 190.4 lb

## 2021-04-21 DIAGNOSIS — K5130 Ulcerative (chronic) rectosigmoiditis without complications: Secondary | ICD-10-CM

## 2021-04-21 DIAGNOSIS — K921 Melena: Secondary | ICD-10-CM

## 2021-04-21 MED ORDER — ROWASA 4 G RE KIT: 1.0000 | PACK | Freq: Every day | RECTAL | 5 refills | Status: DC

## 2021-04-21 NOTE — Progress Notes (Signed)
Referring Provider: Celene Squibb, MD Primary Care Physician:  Celene Squibb, MD Primary GI:  Dr. Abbey Chatters  Chief Complaint  Patient presents with   Colonoscopy    Poor prep    HPI:   Shannon Wilson is a 51 y.o. male who presents to clinic today for follow-up visit.  Recently underwent colonoscopy 02/11/2021 for colon cancer screening purposes.  Unfortunately, his cecum and ascending colon were not adequately prepped unfortunately.  Patient states that he vomited some of his Suprep due to taste.  His colonoscopy did reveal significant inflammation in his rectum and sigmoid colon extending approximately 20 cm from the anal verge.  Biopsies of the area showed mildly active chronic proctocolitis negative for granulomas or dysplasia.  Further showed crypt architectural distortion full-thickness and basal lymphoid plasmacytosis with mild cryptitis suggestive of idiopathic inflammatory bowel disease.  Upon further questioning, patient does note that he has 3-4 bowel movements daily.  Sometimes they are loose.  Does note occasional blood and mucus in his stools as well.  States that food will run right through him at times.  He thought this was just his normal bowel function.  No family history of autoimmune disorders.  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    Past Surgical History:  Procedure Laterality Date   BACK SURGERY  08/2012   BIOPSY  02/11/2021   Procedure: BIOPSY;  Surgeon: Eloise Harman, DO;  Location: [facility];  Service: Endoscopy;;   COLONOSCOPY WITH PROPOFOL N/A 02/11/2021   Procedure: COLONOSCOPY WITH PROPOFOL;  Surgeon: Eloise Harman, DO;  Location: [facility];  Service: Endoscopy;   Laterality: N/A;  9:15 / ASA III   INGUINAL HERNIA REPAIR Right 10/18/2020   Procedure: HERNIA REPAIR INGUINAL ADULT W/MESH;  Surgeon: Virl Cagey, MD;  Location: [facility];  Service: General;  Laterality: Right;  pt knows to arrive at 6:15   SPINE SURGERY  08/2012   discectomy    Current Outpatient Medications  Medication Sig Dispense Refill   amLODipine (NORVASC) 10 MG tablet Take 1 tablet (10 mg total) by mouth daily. 30 tablet 0   aspirin EC 81 MG tablet Take 1 tablet (81 mg total) by mouth 2 (two) times a week. (Patient taking differently: Take 81 mg by mouth daily at 6 (six) AM.)     baclofen (LIORESAL) 10 MG tablet Take 10 mg by mouth 3 (three) times daily as needed for muscle spasms.     cyclobenzaprine (FLEXERIL) 5 MG tablet Take 1 tablet (5 mg total) by mouth at bedtime. (Patient taking differently: Take 5 mg by mouth at bedtime as needed for muscle spasms.) 20 tablet 0   gabapentin (NEURONTIN) 300 MG capsule Take 300 mg by mouth 3 (three) times daily as needed (pain).     ibuprofen (ADVIL) 800 MG tablet Take 800 mg by mouth every 8 (eight) hours as needed for moderate pain.     lisinopril (ZESTRIL) 20 MG tablet Take 20 mg by mouth daily.     Mesalamine-Cleanser (ROWASA) 4 g KIT Place 1 kit (4 g total) rectally at bedtime. 30 kit 5   rosuvastatin (CRESTOR)  20 MG tablet Take 20 mg by mouth at bedtime.     amoxicillin-clavulanate (AUGMENTIN) 875-125 MG tablet Take 1 tablet by mouth every 12 (twelve) hours. (Patient not taking: Reported on 04/21/2021) 20 tablet 0   No current facility-administered medications for this visit.    Allergies as of 04/21/2021 - Review Complete 04/21/2021  Allergen Reaction Noted   Hydrocodone-acetaminophen Nausea And Vomiting 07/03/2020    Family History  Problem Relation Age of Onset   Hypertension Mother    Heart disease Mother 87   Cancer Father        prostate   Sickle cell trait Father    Sickle cell trait Son    Cancer Maternal  Grandmother        bone   Cancer Paternal Grandmother    Cancer Paternal Grandfather        liver   Learning disabilities Neg Hx     Social History   Socioeconomic History   Marital status: Married    Spouse name: Not on file   Number of children: 4   Years of education: 12   Highest education level: Not on file  Occupational History   Occupation: disabled    Comment: heating and air  Tobacco Use   Smoking status: Never   Smokeless tobacco: Never  Vaping Use   Vaping Use: Never used  Substance and Sexual Activity   Alcohol use: No   Drug use: No   Sexual activity: Not Currently    Birth control/protection: None  Other Topics Concern   Not on file  Social History Narrative   Living with mother   Moved to [place] to be near family   Social Determinants of Health   Financial Resource Strain: Not on file  Food Insecurity: Not on file  Transportation Needs: Not on file  Physical Activity: Not on file  Stress: Not on file  Social Connections: Not on file    Subjective: Review of Systems  Constitutional:  Negative for chills and fever.  HENT:  Negative for congestion and hearing loss.   Eyes:  Negative for blurred vision and double vision.  Respiratory:  Negative for cough and shortness of breath.   Cardiovascular:  Negative for chest pain and palpitations.  Gastrointestinal:  Positive for blood in stool. Negative for abdominal pain, constipation, diarrhea, heartburn, melena and vomiting.  Genitourinary:  Negative for dysuria and urgency.  Musculoskeletal:  Negative for joint pain and myalgias.  Skin:  Negative for itching and rash.  Neurological:  Negative for dizziness and headaches.  Psychiatric/Behavioral:  Negative for depression. The patient is not nervous/anxious.     Objective: BP (!) 150/103    Pulse 63    Temp 98 F (36.7 C) (Temporal)    Ht 5' 11"  (1.803 m)    Wt 190 lb 6.4 oz (86.4 kg)    BMI 26.56 kg/m  Physical Exam Constitutional:       Appearance: Normal appearance.  HENT:     Head: Normocephalic and atraumatic.  Eyes:     Extraocular Movements: Extraocular movements intact.     Conjunctiva/sclera: Conjunctivae normal.  Cardiovascular:     Rate and Rhythm: Normal rate and regular rhythm.  Pulmonary:     Effort: Pulmonary effort is normal.     Breath sounds: Normal breath sounds.  Abdominal:     General: Bowel sounds are normal.     Palpations: Abdomen is soft.  Musculoskeletal:        General: Normal range  of motion.     Cervical back: Normal range of motion and neck supple.  Skin:    General: Skin is warm.  Neurological:     General: No focal deficit present.     Mental Status: He is alert and oriented to person, place, and time.  Psychiatric:        Mood and Affect: Mood normal.        Behavior: Behavior normal.     Assessment: *Ulcerative colitis-proctosigmoiditis, mild to moderate *Blood in stool  Plan: Discussed ulcerative colitis in depth with patient and his wife Judson Roch today.  His disease extends approximately 20 cm from the rectum into the sigmoid colon.  Mild symptoms indicative of likely mild to moderate disease.  Will check CBC, CRP, CMP today.  I will start him on mesalamine enemas.  He needs repeat colonoscopy given poor prep previously.  States he vomited some of the colon preparation.  We will use something other than Suprep for his next colonoscopy which we will tentatively plan on doing in 6 to 12 months.  Follow-up in 3 months or sooner if needed.    04/21/2021 1:58 PM   Disclaimer: This note was dictated with voice recognition software. Similar sounding words can inadvertently be transcribed and may not be corrected upon review.

## 2021-04-21 NOTE — Patient Instructions (Addendum)
For your ulcerative colitis, I am going to check blood work today at [facility] including CBC, CMP, and CRP.  I am also going to start you on a medication called mesalamine enema's which I want you to use once nightly.  Hopefully this improves your symptoms as well as decreases inflammation in your colon.  We will tentatively plan on repeat colonoscopy in 6 to 12 months.  Follow-up in 3 months or sooner if needed.  It was nice seeing you again today.  Dr. Abbey Chatters  At [facility] we value your feedback. You may receive a survey about your visit today. Please share your experience as we strive to create trusting relationships with our patients to provide genuine, compassionate, quality care.  We appreciate your understanding and patience as we review any laboratory studies, imaging, and other diagnostic tests that are ordered as we care for you. Our office policy is 5 business days for review of these results, and any emergent or urgent results are addressed in a timely manner for your best interest. If you do not hear from our office in 1 week, please contact us.   We also encourage the use of MyChart, which contains your medical information for your review as well. If you are not enrolled in this feature, an access code is on this after visit summary for your convenience. Thank you for allowing [place] to be involved in your care.  It was great to see you today!  I hope you have a great rest of your Winter!    Elon Alas. Abbey Chatters, D.O. Gastroenterology and Hepatology [facility]

## 2021-04-22 LAB — CBC
HCT: 40.2 % (ref 38.5–50.0)
Hemoglobin: 13.4 g/dL (ref 13.2–17.1)
MCH: 29.1 pg (ref 27.0–33.0)
MCHC: 33.3 g/dL (ref 32.0–36.0)
MCV: 87.2 fL (ref 80.0–100.0)
MPV: 9.7 fL (ref 7.5–12.5)
Platelets: 328 10*3/uL (ref 140–400)
RBC: 4.61 10*6/uL (ref 4.20–5.80)
RDW: 12.8 % (ref 11.0–15.0)
WBC: 7.3 10*3/uL (ref 3.8–10.8)

## 2021-04-22 LAB — COMPLETE METABOLIC PANEL WITH GFR
AG Ratio: 1.4 (calc) (ref 1.0–2.5)
ALT: 20 U/L (ref 9–46)
AST: 20 U/L (ref 10–40)
Albumin: 4.3 g/dL (ref 3.6–5.1)
Alkaline phosphatase (APISO): 103 U/L (ref 36–130)
BUN: 18 mg/dL (ref 7–25)
CO2: 26 mmol/L (ref 20–32)
Calcium: 9.5 mg/dL (ref 8.6–10.3)
Chloride: 109 mmol/L (ref 98–110)
Creat: 1.23 mg/dL (ref 0.60–1.29)
Globulin: 3 g/dL (calc) (ref 1.9–3.7)
Glucose, Bld: 102 mg/dL — ABNORMAL HIGH (ref 65–99)
Potassium: 3.8 mmol/L (ref 3.5–5.3)
Sodium: 143 mmol/L (ref 135–146)
Total Bilirubin: 0.6 mg/dL (ref 0.2–1.2)
Total Protein: 7.3 g/dL (ref 6.1–8.1)
eGFR: 72 mL/min/{1.73_m2} (ref >=60)

## 2021-04-22 LAB — C-REACTIVE PROTEIN: CRP: 2.4 mg/L (ref <8.0)

## 2021-04-25 DIAGNOSIS — Z79899 Other long term (current) drug therapy: Secondary | ICD-10-CM | POA: Diagnosis not present

## 2021-05-11 DIAGNOSIS — G894 Chronic pain syndrome: Secondary | ICD-10-CM | POA: Diagnosis not present

## 2021-05-11 DIAGNOSIS — Z79899 Other long term (current) drug therapy: Secondary | ICD-10-CM | POA: Diagnosis not present

## 2021-05-23 DIAGNOSIS — R011 Cardiac murmur, unspecified: Secondary | ICD-10-CM | POA: Diagnosis not present

## 2021-05-27 DIAGNOSIS — G894 Chronic pain syndrome: Secondary | ICD-10-CM | POA: Diagnosis not present

## 2021-05-27 DIAGNOSIS — Z6824 Body mass index (BMI) 24.0-24.9, adult: Secondary | ICD-10-CM | POA: Diagnosis not present

## 2021-05-27 DIAGNOSIS — Z79899 Other long term (current) drug therapy: Secondary | ICD-10-CM | POA: Diagnosis not present

## 2021-05-27 DIAGNOSIS — R030 Elevated blood-pressure reading, without diagnosis of hypertension: Secondary | ICD-10-CM | POA: Diagnosis not present

## 2021-05-27 DIAGNOSIS — M545 Low back pain, unspecified: Secondary | ICD-10-CM | POA: Diagnosis not present

## 2021-06-01 DIAGNOSIS — Z79899 Other long term (current) drug therapy: Secondary | ICD-10-CM | POA: Diagnosis not present

## 2021-06-02 ENCOUNTER — Encounter: Payer: Self-pay | Admitting: Internal Medicine

## 2021-06-08 DIAGNOSIS — G894 Chronic pain syndrome: Secondary | ICD-10-CM | POA: Diagnosis not present

## 2021-06-08 DIAGNOSIS — Z79899 Other long term (current) drug therapy: Secondary | ICD-10-CM | POA: Diagnosis not present

## 2021-06-10 ENCOUNTER — Emergency Department (HOSPITAL_COMMUNITY)
Admission: EM | Admit: 2021-06-10 | Discharge: 2021-06-10 | Disposition: A | Discharge status: Home / Self Care | Payer: Medicare Other | Attending: Emergency Medicine | Admitting: Emergency Medicine

## 2021-06-10 ENCOUNTER — Other Ambulatory Visit: Payer: Self-pay

## 2021-06-10 DIAGNOSIS — R1013 Epigastric pain: Secondary | ICD-10-CM | POA: Insufficient documentation

## 2021-06-10 DIAGNOSIS — Z5321 Procedure and treatment not carried out due to patient leaving prior to being seen by health care provider: Secondary | ICD-10-CM | POA: Insufficient documentation

## 2021-06-10 LAB — COMPREHENSIVE METABOLIC PANEL
ALT: 29 U/L (ref 0–44)
AST: 27 U/L (ref 15–41)
Albumin: 4.1 g/dL (ref 3.5–5.0)
Alkaline Phosphatase: 97 U/L (ref 38–126)
Anion gap: 7 (ref 5–15)
BUN: 20 mg/dL (ref 6–20)
CO2: 28 mmol/L (ref 22–32)
Calcium: 9.3 mg/dL (ref 8.9–10.3)
Chloride: 109 mmol/L (ref 98–111)
Creatinine, Ser: 1.08 mg/dL (ref 0.61–1.24)
GFR, Estimated: >60 mL/min (ref >60)
Glucose, Bld: 121 mg/dL — ABNORMAL HIGH (ref 70–99)
Potassium: 3.7 mmol/L (ref 3.5–5.1)
Sodium: 144 mmol/L (ref 135–145)
Total Bilirubin: 0.4 mg/dL (ref 0.3–1.2)
Total Protein: 7.6 g/dL (ref 6.5–8.1)

## 2021-06-10 LAB — CBC
HCT: 39.8 % (ref 39.0–52.0)
Hemoglobin: 13.4 g/dL (ref 13.0–17.0)
MCH: 29.5 pg (ref 26.0–34.0)
MCHC: 33.7 g/dL (ref 30.0–36.0)
MCV: 87.5 fL (ref 80.0–100.0)
Platelets: 284 10*3/uL (ref 150–400)
RBC: 4.55 MIL/uL (ref 4.22–5.81)
RDW: 13.1 % (ref 11.5–15.5)
WBC: 6.8 10*3/uL (ref 4.0–10.5)
nRBC: 0 % (ref 0.0–0.2)

## 2021-06-10 LAB — LIPASE, BLOOD: Lipase: 39 U/L (ref 11–51)

## 2021-06-10 NOTE — ED Triage Notes (Signed)
Epigastric pain x 2 days after using Ulcerative Colitis enema. ?

## 2021-06-10 NOTE — ED Triage Notes (Signed)
Denies nvd or fever. ?

## 2021-06-17 ENCOUNTER — Other Ambulatory Visit: Payer: Self-pay | Admitting: Orthopedic Surgery

## 2021-06-17 DIAGNOSIS — M5416 Radiculopathy, lumbar region: Secondary | ICD-10-CM | POA: Diagnosis not present

## 2021-06-17 DIAGNOSIS — M545 Low back pain, unspecified: Secondary | ICD-10-CM

## 2021-06-21 DIAGNOSIS — I10 Essential (primary) hypertension: Secondary | ICD-10-CM | POA: Diagnosis not present

## 2021-06-21 DIAGNOSIS — R011 Cardiac murmur, unspecified: Secondary | ICD-10-CM | POA: Diagnosis not present

## 2021-06-21 DIAGNOSIS — R7301 Impaired fasting glucose: Secondary | ICD-10-CM | POA: Diagnosis not present

## 2021-06-23 DIAGNOSIS — E785 Hyperlipidemia, unspecified: Secondary | ICD-10-CM | POA: Diagnosis not present

## 2021-06-23 DIAGNOSIS — R109 Unspecified abdominal pain: Secondary | ICD-10-CM | POA: Diagnosis not present

## 2021-06-23 DIAGNOSIS — R7301 Impaired fasting glucose: Secondary | ICD-10-CM | POA: Diagnosis not present

## 2021-06-23 DIAGNOSIS — K5130 Ulcerative (chronic) rectosigmoiditis without complications: Secondary | ICD-10-CM | POA: Diagnosis not present

## 2021-06-23 DIAGNOSIS — R809 Proteinuria, unspecified: Secondary | ICD-10-CM | POA: Diagnosis not present

## 2021-06-23 DIAGNOSIS — I10 Essential (primary) hypertension: Secondary | ICD-10-CM | POA: Diagnosis not present

## 2021-07-07 ENCOUNTER — Ambulatory Visit
Admission: RE | Admit: 2021-07-07 | Discharge: 2021-07-07 | Disposition: A | Discharge status: Home / Self Care | Payer: Medicare Other | Source: Ambulatory Visit | Attending: Orthopedic Surgery | Admitting: Orthopedic Surgery

## 2021-07-07 DIAGNOSIS — M545 Low back pain, unspecified: Secondary | ICD-10-CM

## 2021-07-07 DIAGNOSIS — M533 Sacrococcygeal disorders, not elsewhere classified: Secondary | ICD-10-CM | POA: Diagnosis not present

## 2021-07-08 DIAGNOSIS — E7849 Other hyperlipidemia: Secondary | ICD-10-CM | POA: Diagnosis not present

## 2021-07-08 DIAGNOSIS — I10 Essential (primary) hypertension: Secondary | ICD-10-CM | POA: Diagnosis not present

## 2021-07-09 DIAGNOSIS — G894 Chronic pain syndrome: Secondary | ICD-10-CM | POA: Diagnosis not present

## 2021-07-09 DIAGNOSIS — Z79899 Other long term (current) drug therapy: Secondary | ICD-10-CM | POA: Diagnosis not present

## 2021-07-11 ENCOUNTER — Other Ambulatory Visit: Payer: Medicare Other

## 2021-07-16 ENCOUNTER — Other Ambulatory Visit: Payer: Medicare Other

## 2021-07-17 ENCOUNTER — Ambulatory Visit
Admission: RE | Admit: 2021-07-17 | Discharge: 2021-07-17 | Disposition: A | Discharge status: Home / Self Care | Payer: Medicare Other | Source: Ambulatory Visit | Attending: Orthopedic Surgery | Admitting: Orthopedic Surgery

## 2021-07-17 DIAGNOSIS — M545 Low back pain, unspecified: Secondary | ICD-10-CM | POA: Diagnosis not present

## 2021-07-17 DIAGNOSIS — M5416 Radiculopathy, lumbar region: Secondary | ICD-10-CM

## 2021-07-21 ENCOUNTER — Encounter: Payer: Self-pay | Admitting: Internal Medicine

## 2021-07-26 ENCOUNTER — Encounter: Payer: Self-pay | Admitting: *Deleted

## 2021-09-07 DIAGNOSIS — E785 Hyperlipidemia, unspecified: Secondary | ICD-10-CM | POA: Diagnosis not present

## 2021-09-07 DIAGNOSIS — I10 Essential (primary) hypertension: Secondary | ICD-10-CM | POA: Diagnosis not present

## 2021-10-07 DIAGNOSIS — E785 Hyperlipidemia, unspecified: Secondary | ICD-10-CM | POA: Diagnosis not present

## 2021-10-07 DIAGNOSIS — I10 Essential (primary) hypertension: Secondary | ICD-10-CM | POA: Diagnosis not present

## 2021-11-25 DIAGNOSIS — I776 Arteritis, unspecified: Secondary | ICD-10-CM | POA: Diagnosis not present

## 2021-11-25 DIAGNOSIS — G894 Chronic pain syndrome: Secondary | ICD-10-CM | POA: Diagnosis not present

## 2021-11-25 DIAGNOSIS — R030 Elevated blood-pressure reading, without diagnosis of hypertension: Secondary | ICD-10-CM | POA: Diagnosis not present

## 2021-11-25 DIAGNOSIS — Z79899 Other long term (current) drug therapy: Secondary | ICD-10-CM | POA: Diagnosis not present

## 2021-11-25 DIAGNOSIS — M545 Low back pain, unspecified: Secondary | ICD-10-CM | POA: Diagnosis not present

## 2021-12-23 DIAGNOSIS — E785 Hyperlipidemia, unspecified: Secondary | ICD-10-CM | POA: Diagnosis not present

## 2021-12-26 DIAGNOSIS — Z0001 Encounter for general adult medical examination with abnormal findings: Secondary | ICD-10-CM | POA: Diagnosis not present

## 2021-12-26 DIAGNOSIS — R7301 Impaired fasting glucose: Secondary | ICD-10-CM | POA: Diagnosis not present

## 2021-12-26 DIAGNOSIS — E870 Hyperosmolality and hypernatremia: Secondary | ICD-10-CM | POA: Diagnosis not present

## 2021-12-26 DIAGNOSIS — M546 Pain in thoracic spine: Secondary | ICD-10-CM | POA: Diagnosis not present

## 2021-12-26 DIAGNOSIS — Z23 Encounter for immunization: Secondary | ICD-10-CM | POA: Diagnosis not present

## 2021-12-26 DIAGNOSIS — E785 Hyperlipidemia, unspecified: Secondary | ICD-10-CM | POA: Diagnosis not present

## 2021-12-26 DIAGNOSIS — I10 Essential (primary) hypertension: Secondary | ICD-10-CM | POA: Diagnosis not present

## 2021-12-26 DIAGNOSIS — K4090 Unilateral inguinal hernia, without obstruction or gangrene, not specified as recurrent: Secondary | ICD-10-CM | POA: Diagnosis not present

## 2021-12-27 DIAGNOSIS — G894 Chronic pain syndrome: Secondary | ICD-10-CM | POA: Diagnosis not present

## 2021-12-27 DIAGNOSIS — Z79899 Other long term (current) drug therapy: Secondary | ICD-10-CM | POA: Diagnosis not present

## 2021-12-27 DIAGNOSIS — M545 Low back pain, unspecified: Secondary | ICD-10-CM | POA: Diagnosis not present

## 2021-12-27 DIAGNOSIS — I776 Arteritis, unspecified: Secondary | ICD-10-CM | POA: Diagnosis not present

## 2021-12-27 DIAGNOSIS — R030 Elevated blood-pressure reading, without diagnosis of hypertension: Secondary | ICD-10-CM | POA: Diagnosis not present

## 2021-12-29 DIAGNOSIS — Z79899 Other long term (current) drug therapy: Secondary | ICD-10-CM | POA: Diagnosis not present

## 2022-01-07 DIAGNOSIS — E785 Hyperlipidemia, unspecified: Secondary | ICD-10-CM | POA: Diagnosis not present

## 2022-01-07 DIAGNOSIS — I10 Essential (primary) hypertension: Secondary | ICD-10-CM | POA: Diagnosis not present

## 2022-01-26 DIAGNOSIS — Z0000 Encounter for general adult medical examination without abnormal findings: Secondary | ICD-10-CM | POA: Diagnosis not present

## 2022-01-26 DIAGNOSIS — I776 Arteritis, unspecified: Secondary | ICD-10-CM | POA: Diagnosis not present

## 2022-01-26 DIAGNOSIS — R030 Elevated blood-pressure reading, without diagnosis of hypertension: Secondary | ICD-10-CM | POA: Diagnosis not present

## 2022-01-26 DIAGNOSIS — M545 Low back pain, unspecified: Secondary | ICD-10-CM | POA: Diagnosis not present

## 2022-01-26 DIAGNOSIS — G894 Chronic pain syndrome: Secondary | ICD-10-CM | POA: Diagnosis not present

## 2022-01-26 DIAGNOSIS — Z79899 Other long term (current) drug therapy: Secondary | ICD-10-CM | POA: Diagnosis not present

## 2022-02-24 DIAGNOSIS — I10 Essential (primary) hypertension: Secondary | ICD-10-CM | POA: Diagnosis not present

## 2022-02-24 DIAGNOSIS — M545 Low back pain, unspecified: Secondary | ICD-10-CM | POA: Diagnosis not present

## 2022-02-24 DIAGNOSIS — I776 Arteritis, unspecified: Secondary | ICD-10-CM | POA: Diagnosis not present

## 2022-02-24 DIAGNOSIS — Z79899 Other long term (current) drug therapy: Secondary | ICD-10-CM | POA: Diagnosis not present

## 2022-02-24 DIAGNOSIS — G894 Chronic pain syndrome: Secondary | ICD-10-CM | POA: Diagnosis not present

## 2022-02-24 DIAGNOSIS — R030 Elevated blood-pressure reading, without diagnosis of hypertension: Secondary | ICD-10-CM | POA: Diagnosis not present

## 2022-02-24 IMAGING — CT CT RENAL STONE PROTOCOL
2 of 4 series · 16 of 46 positions shown, 18 images · non-contrast
Comparison: August 12, 2017

CLINICAL DATA: Blood in urine.

EXAM:
CT ABDOMEN AND PELVIS WITHOUT CONTRAST
TECHNIQUE: Multidetector CT imaging of the abdomen and pelvis was performed
following the standard protocol without IV contrast.

[Series 2: axial st · axial · 0.75mm/px · z∈[+833,+1258]mm · 13 of 97 slices shown, 15 images]
[im 6/97  soft-tissue]
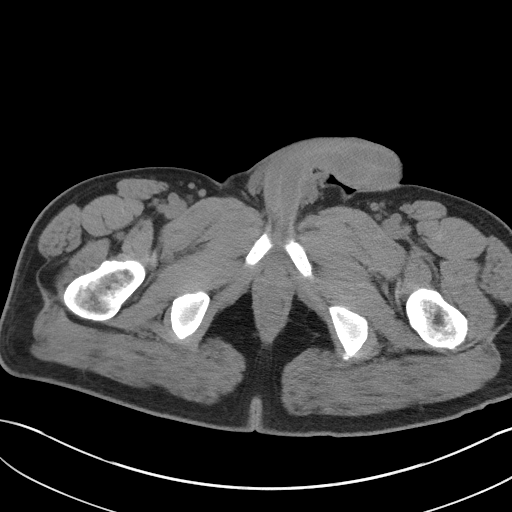
[im 6/97  bone]
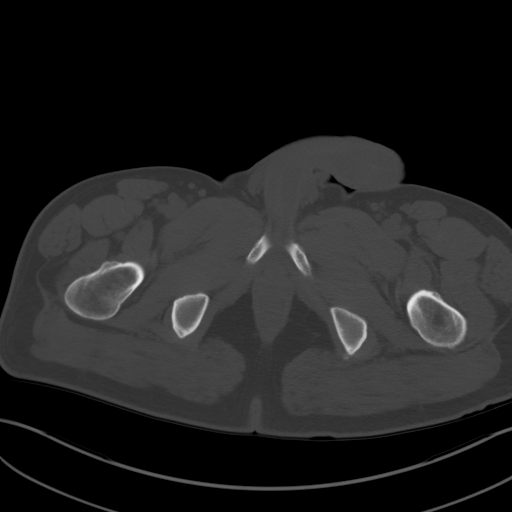
[im 11/97  soft-tissue]
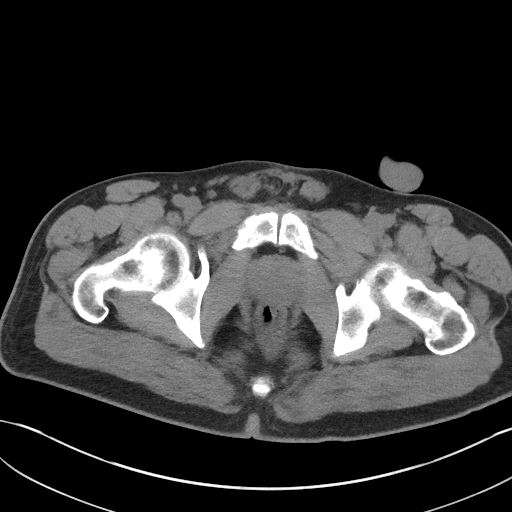
[im 22/97  soft-tissue]
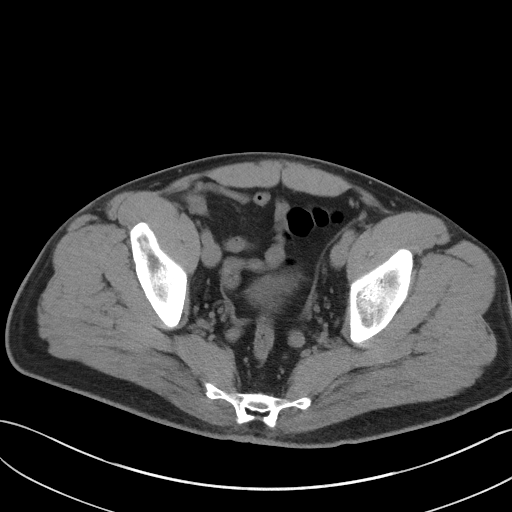
[im 27/97  soft-tissue]
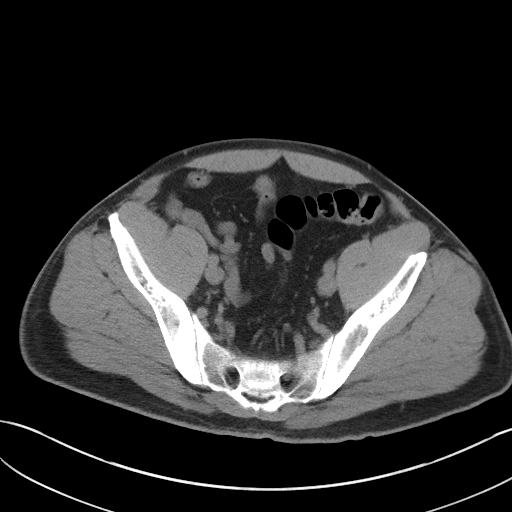
[im 33/97  soft-tissue]
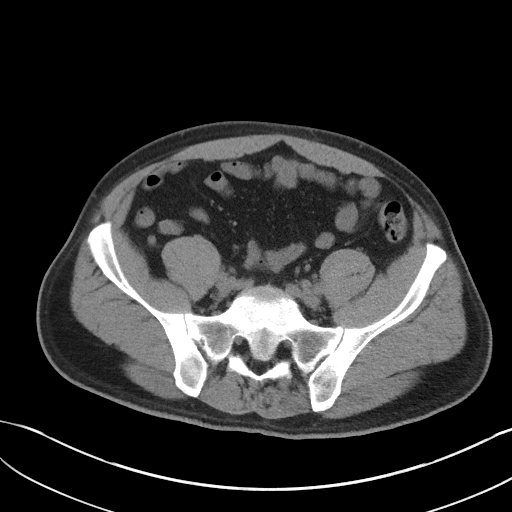
[im 43/97  soft-tissue]
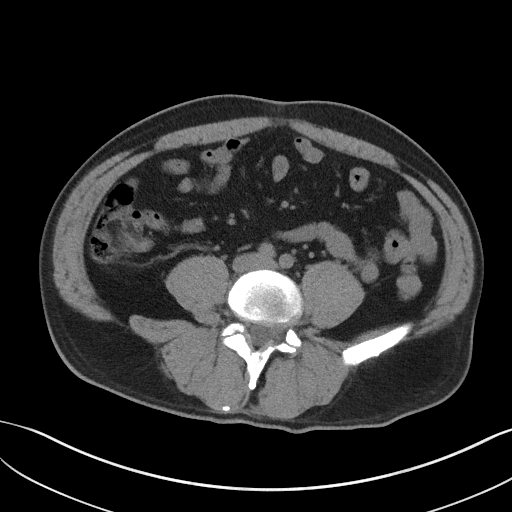
[im 49/97  soft-tissue]
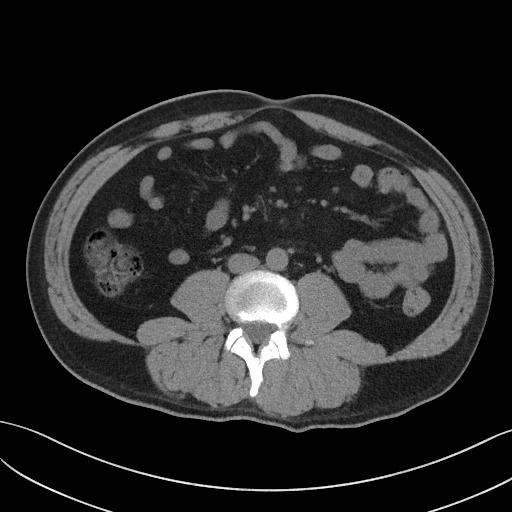
[im 54/97  soft-tissue]
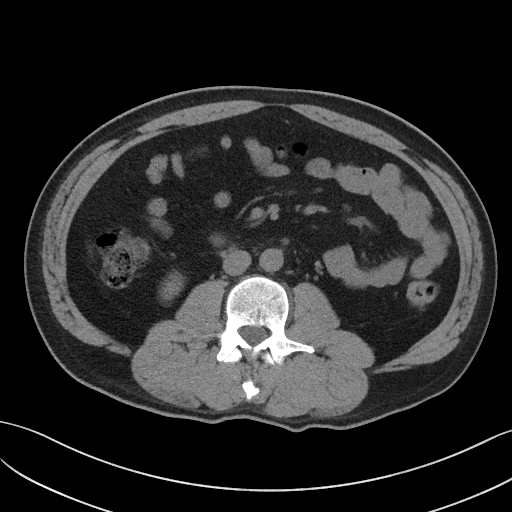
[im 65/97  soft-tissue]
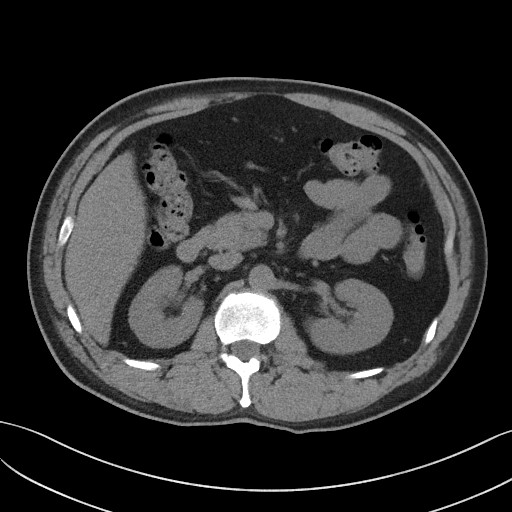
[im 65/97  bone]
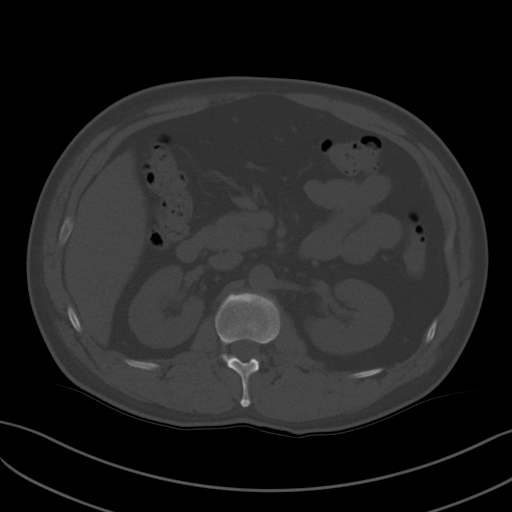
[im 70/97  soft-tissue]
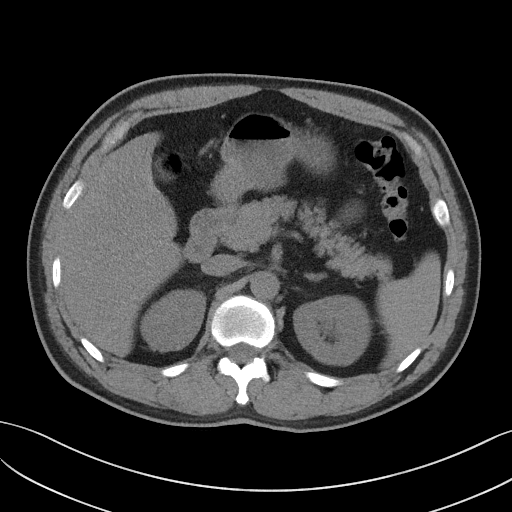
[im 75/97  soft-tissue]
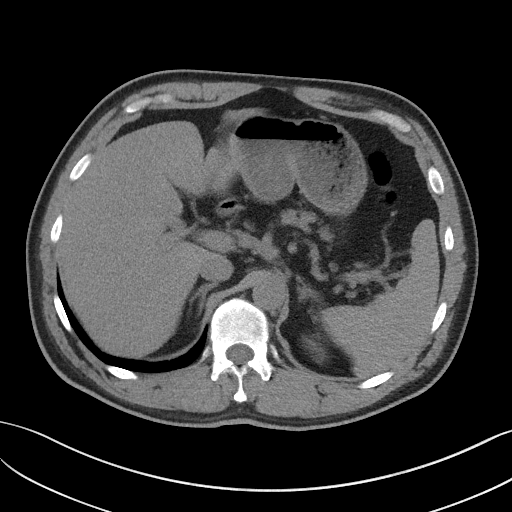
[im 86/97  soft-tissue]
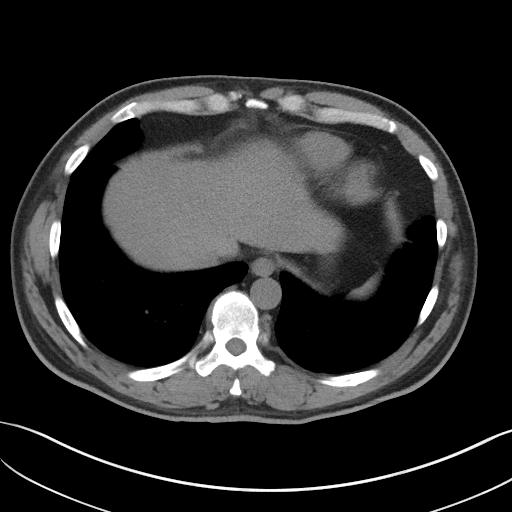
[im 91/97  soft-tissue]
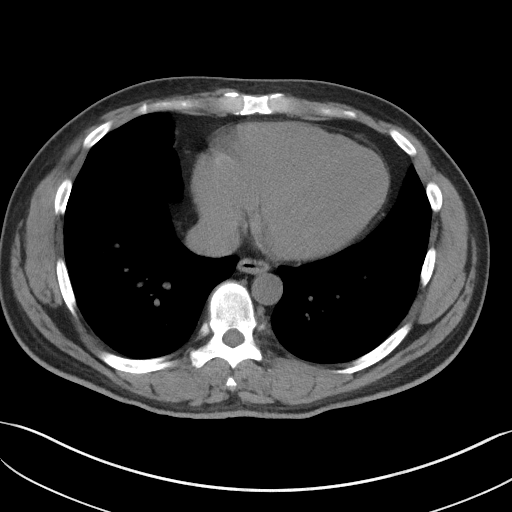

[Series 5: coronal st · coronal · 0.79mm/px · 3 of 94 slices shown]
[im 32/94  soft-tissue]
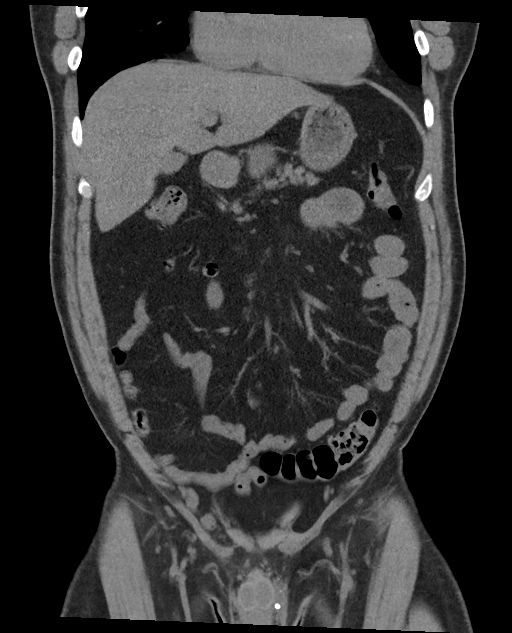
[im 42/94  soft-tissue]
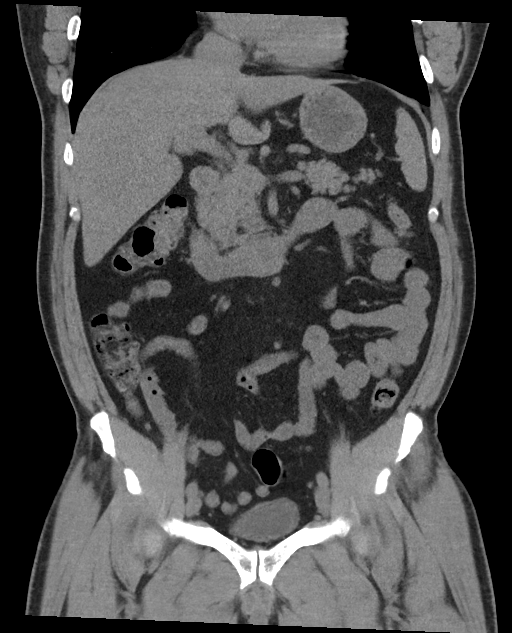
[im 52/94  soft-tissue]
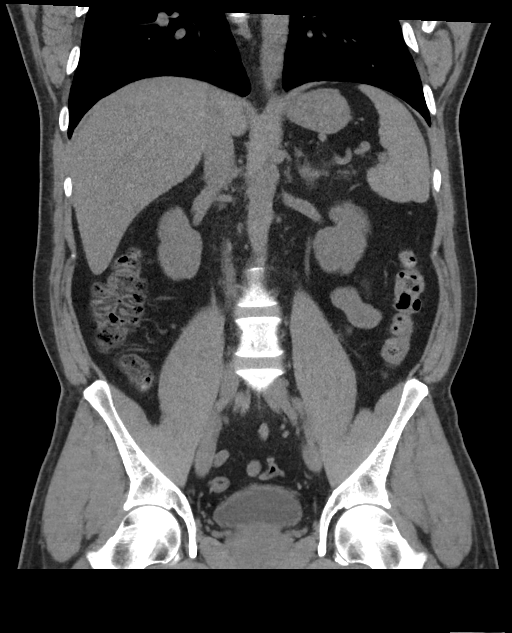

[16 of 46 positions shown; findings below may reference images not displayed]

FINDINGS: Lower chest: No acute abnormality.

Hepatobiliary: No focal liver abnormality is seen. No gallstones,
gallbladder wall thickening, or biliary dilatation.

Pancreas: Unremarkable. No pancreatic ductal dilatation or
surrounding inflammatory changes.

Spleen: Normal in size without focal abnormality.

Adrenals/Urinary Tract: Adrenal glands are unremarkable. Kidneys are
normal, without renal calculi, focal lesion, or hydronephrosis.
Bladder is unremarkable.

Stomach/Bowel: Stomach is within normal limits. Appendix appears
normal. No evidence of bowel wall thickening, distention, or
inflammatory changes.

Vascular/Lymphatic: No significant vascular findings are present. No
enlarged abdominal or pelvic lymph nodes.

Reproductive: Prostate is unremarkable.

Other: There is a right inguinal hernia containing a small amount of
small bowel without obstruction. There is a fat containing
periumbilical hernia.

Musculoskeletal: No acute or significant osseous findings.
IMPRESSION: 1. No cause for hematuria identified. No renal stones or masses are
noted.
2. There is a right inguinal hernia containing a small amount of
small bowel without obstruction. There is a fat containing umbilical
hernia as well.

## 2022-03-24 DIAGNOSIS — G894 Chronic pain syndrome: Secondary | ICD-10-CM | POA: Diagnosis not present

## 2022-03-24 DIAGNOSIS — M545 Low back pain, unspecified: Secondary | ICD-10-CM | POA: Diagnosis not present

## 2022-03-24 DIAGNOSIS — R030 Elevated blood-pressure reading, without diagnosis of hypertension: Secondary | ICD-10-CM | POA: Diagnosis not present

## 2022-03-24 DIAGNOSIS — Z79899 Other long term (current) drug therapy: Secondary | ICD-10-CM | POA: Diagnosis not present

## 2022-03-24 DIAGNOSIS — I776 Arteritis, unspecified: Secondary | ICD-10-CM | POA: Diagnosis not present

## 2022-03-24 DIAGNOSIS — I10 Essential (primary) hypertension: Secondary | ICD-10-CM | POA: Diagnosis not present

## 2022-04-18 ENCOUNTER — Encounter: Payer: Self-pay | Admitting: Internal Medicine

## 2022-04-21 DIAGNOSIS — G894 Chronic pain syndrome: Secondary | ICD-10-CM | POA: Diagnosis not present

## 2022-04-21 DIAGNOSIS — Z79899 Other long term (current) drug therapy: Secondary | ICD-10-CM | POA: Diagnosis not present

## 2022-04-21 DIAGNOSIS — M545 Low back pain, unspecified: Secondary | ICD-10-CM | POA: Diagnosis not present

## 2022-04-21 DIAGNOSIS — I776 Arteritis, unspecified: Secondary | ICD-10-CM | POA: Diagnosis not present

## 2022-04-21 DIAGNOSIS — I10 Essential (primary) hypertension: Secondary | ICD-10-CM | POA: Diagnosis not present

## 2022-04-21 DIAGNOSIS — R030 Elevated blood-pressure reading, without diagnosis of hypertension: Secondary | ICD-10-CM | POA: Diagnosis not present

## 2022-05-11 ENCOUNTER — Emergency Department (HOSPITAL_BASED_OUTPATIENT_CLINIC_OR_DEPARTMENT_OTHER): Payer: Medicare Other | Admitting: Radiology

## 2022-05-11 ENCOUNTER — Emergency Department (HOSPITAL_BASED_OUTPATIENT_CLINIC_OR_DEPARTMENT_OTHER)
Admission: EM | Admit: 2022-05-11 | Discharge: 2022-05-11 | Disposition: A | Discharge status: Home / Self Care | Payer: Medicare Other | Attending: Emergency Medicine | Admitting: Emergency Medicine

## 2022-05-11 ENCOUNTER — Encounter (HOSPITAL_BASED_OUTPATIENT_CLINIC_OR_DEPARTMENT_OTHER): Payer: Self-pay

## 2022-05-11 ENCOUNTER — Other Ambulatory Visit: Payer: Self-pay

## 2022-05-11 DIAGNOSIS — R0789 Other chest pain: Secondary | ICD-10-CM | POA: Diagnosis not present

## 2022-05-11 DIAGNOSIS — I10 Essential (primary) hypertension: Secondary | ICD-10-CM | POA: Insufficient documentation

## 2022-05-11 DIAGNOSIS — R0602 Shortness of breath: Secondary | ICD-10-CM | POA: Diagnosis not present

## 2022-05-11 DIAGNOSIS — Z79899 Other long term (current) drug therapy: Secondary | ICD-10-CM | POA: Insufficient documentation

## 2022-05-11 DIAGNOSIS — E876 Hypokalemia: Secondary | ICD-10-CM | POA: Diagnosis not present

## 2022-05-11 DIAGNOSIS — Z1152 Encounter for screening for COVID-19: Secondary | ICD-10-CM | POA: Diagnosis not present

## 2022-05-11 DIAGNOSIS — Z7982 Long term (current) use of aspirin: Secondary | ICD-10-CM | POA: Insufficient documentation

## 2022-05-11 DIAGNOSIS — U071 COVID-19: Secondary | ICD-10-CM | POA: Diagnosis not present

## 2022-05-11 DIAGNOSIS — R059 Cough, unspecified: Secondary | ICD-10-CM | POA: Diagnosis present

## 2022-05-11 DIAGNOSIS — R079 Chest pain, unspecified: Secondary | ICD-10-CM

## 2022-05-11 LAB — RESP PANEL BY RT-PCR (RSV, FLU A&B, COVID)  RVPGX2
Influenza A by PCR: NEGATIVE
Influenza B by PCR: NEGATIVE
Resp Syncytial Virus by PCR: NEGATIVE
SARS Coronavirus 2 by RT PCR: POSITIVE — AB

## 2022-05-11 LAB — TROPONIN I (HIGH SENSITIVITY)
Troponin I (High Sensitivity): 8 ng/L (ref <18)
Troponin I (High Sensitivity): 8 ng/L (ref <18)

## 2022-05-11 LAB — CBC WITH DIFFERENTIAL/PLATELET
Abs Immature Granulocytes: 0.01 10*3/uL (ref 0.00–0.07)
Basophils Absolute: 0 10*3/uL (ref 0.0–0.1)
Basophils Relative: 0 %
Eosinophils Absolute: 0 10*3/uL (ref 0.0–0.5)
Eosinophils Relative: 1 %
HCT: 37.9 % — ABNORMAL LOW (ref 39.0–52.0)
Hemoglobin: 13.1 g/dL (ref 13.0–17.0)
Immature Granulocytes: 0 %
Lymphocytes Relative: 25 %
Lymphs Abs: 1.2 10*3/uL (ref 0.7–4.0)
MCH: 29.6 pg (ref 26.0–34.0)
MCHC: 34.6 g/dL (ref 30.0–36.0)
MCV: 85.7 fL (ref 80.0–100.0)
Monocytes Absolute: 0.8 10*3/uL (ref 0.1–1.0)
Monocytes Relative: 17 %
Neutro Abs: 2.6 10*3/uL (ref 1.7–7.7)
Neutrophils Relative %: 57 %
Platelets: 231 10*3/uL (ref 150–400)
RBC: 4.42 MIL/uL (ref 4.22–5.81)
RDW: 12.9 % (ref 11.5–15.5)
WBC: 4.7 10*3/uL (ref 4.0–10.5)
nRBC: 0 % (ref 0.0–0.2)

## 2022-05-11 LAB — BASIC METABOLIC PANEL
Anion gap: 9 (ref 5–15)
BUN: 17 mg/dL (ref 6–20)
CO2: 24 mmol/L (ref 22–32)
Calcium: 9.5 mg/dL (ref 8.9–10.3)
Chloride: 109 mmol/L (ref 98–111)
Creatinine, Ser: 1.42 mg/dL — ABNORMAL HIGH (ref 0.61–1.24)
GFR, Estimated: >60 mL/min (ref >60)
Glucose, Bld: 101 mg/dL — ABNORMAL HIGH (ref 70–99)
Potassium: 3.4 mmol/L — ABNORMAL LOW (ref 3.5–5.1)
Sodium: 142 mmol/L (ref 135–145)

## 2022-05-11 LAB — GROUP A STREP BY PCR: Group A Strep by PCR: NOT DETECTED

## 2022-05-11 MED ORDER — POTASSIUM CHLORIDE CRYS ER 20 MEQ PO TBCR
40.0000 meq | EXTENDED_RELEASE_TABLET | Freq: Once | ORAL | Status: AC
  Administered 2022-05-11: 40 meq via ORAL
  Filled 2022-05-11: qty 2

## 2022-05-11 MED ORDER — SODIUM CHLORIDE 0.9 % IV BOLUS
1000.0000 mL | Freq: Once | INTRAVENOUS | Status: AC
  Administered 2022-05-11: 1000 mL via INTRAVENOUS

## 2022-05-11 MED ORDER — POTASSIUM CHLORIDE CRYS ER 20 MEQ PO TBCR: 20.0000 meq | EXTENDED_RELEASE_TABLET | Freq: Two times a day (BID) | ORAL | 0 refills | Status: DC

## 2022-05-11 NOTE — ED Provider Notes (Signed)
[place] [facility] DEPARTMENT AT [facility] Provider Note   CSN: [id number] Arrival date & time: 05/11/22  1529     History  Chief Complaint  Patient presents with   Cough    Shannon Wilson is a 52 y.o. male with history significant for hypertension, MI, TIA, hyperlipidemia, presents to the ED complaining of chest pain, dizziness, blurry vision, cough and sore throat that began yesterday.  He states his chest pain is constant and is not worse with taking a deep breath or coughing.  He has no known sick contacts.  Denies fever, chills, congestion, shortness of breath, chest tightness, abdominal pain, vomiting, diarrhea, syncope, weakness.         Home Medications Prior to Admission medications   Medication Sig Start Date End Date Taking? Authorizing Provider  potassium chloride SA (KLOR-CON M) 20 MEQ tablet Take 1 tablet (20 mEq total) by mouth 2 (two) times daily. 05/11/22  Yes Leahmarie Gasiorowski R, PA  amLODipine (NORVASC) 10 MG tablet Take 1 tablet (10 mg total) by mouth daily. 11/03/18   Terrilee Files, MD  amoxicillin-clavulanate (AUGMENTIN) 875-125 MG tablet Take 1 tablet by mouth every 12 (twelve) hours. Patient not taking: Reported on 04/21/2021 02/06/21   Burgess Amor, PA-C  aspirin EC 81 MG tablet Take 1 tablet (81 mg total) by mouth 2 (two) times a week. Patient taking differently: Take 81 mg by mouth daily at 6 (six) AM. 10/18/20   Lucretia Roers, MD  baclofen (LIORESAL) 10 MG tablet Take 10 mg by mouth 3 (three) times daily as needed for muscle spasms.    [provider]  cyclobenzaprine (FLEXERIL) 5 MG tablet Take 1 tablet (5 mg total) by mouth at bedtime. Patient taking differently: Take 5 mg by mouth at bedtime as needed for muscle spasms. 06/18/17   Eustace Moore, MD  gabapentin (NEURONTIN) 300 MG capsule Take 300 mg by mouth 3 (three) times daily as needed (pain).    [provider]  ibuprofen (ADVIL) 800 MG tablet Take 800 mg by mouth  every 8 (eight) hours as needed for moderate pain.    [provider]  lisinopril (ZESTRIL) 20 MG tablet Take 20 mg by mouth daily.    [provider]  Mesalamine-Cleanser (ROWASA) 4 g KIT Place 1 kit (4 g total) rectally at bedtime. 04/21/21 10/18/21  Lanelle Bal, DO  rosuvastatin (CRESTOR) 20 MG tablet Take 20 mg by mouth at bedtime. 07/21/20   [provider]      Allergies    Hydrocodone-acetaminophen    Review of Systems   Review of Systems  Constitutional:  Negative for chills and fever.  HENT:  Positive for sore throat. Negative for congestion.   Eyes:  Positive for visual disturbance (blurry vision).  Respiratory:  Positive for cough. Negative for chest tightness and shortness of breath.   Cardiovascular:  Positive for chest pain. Negative for palpitations.  Gastrointestinal:  Positive for nausea. Negative for abdominal pain, diarrhea and vomiting.  Neurological:  Positive for dizziness. Negative for syncope and weakness.    Physical Exam Updated Vital Signs BP (!) 164/103   Pulse 71   Temp 99.3 F (37.4 C) (Oral)   Resp 18   Ht 5\' 11"  (1.803 m)   Wt 85.7 kg   SpO2 98%   BMI 26.35 kg/m  Physical Exam Vitals and nursing note reviewed.  Constitutional:      General: He is not in acute distress.    Appearance:  He is not ill-appearing.  HENT:     Head: Normocephalic.     Right Ear: Tympanic membrane and ear canal normal.     Left Ear: Tympanic membrane and ear canal normal.     Nose: Nose normal. No congestion or rhinorrhea.     Mouth/Throat:     Lips: Pink.     Mouth: Mucous membranes are moist.     Pharynx: Oropharynx is clear. Uvula midline. Posterior oropharyngeal erythema present. No pharyngeal swelling or oropharyngeal exudate.     Tonsils: No tonsillar exudate. 1+ on the right. 1+ on the left.     Comments: Posterior oropharynx with post-nasal drip  Eyes:     Conjunctiva/sclera: Conjunctivae normal.     Pupils: Pupils are  equal, round, and reactive to light.  Cardiovascular:     Rate and Rhythm: Normal rate and regular rhythm.     Pulses: Normal pulses.     Heart sounds: Murmur heard.  Pulmonary:     Effort: Pulmonary effort is normal. No tachypnea or respiratory distress.     Breath sounds: Normal breath sounds and air entry.  Chest:     Chest wall: No tenderness.  Abdominal:     General: Abdomen is flat. Bowel sounds are normal. There is no distension.     Palpations: Abdomen is soft.     Tenderness: There is no abdominal tenderness.  Musculoskeletal:     Right lower leg: No edema.     Left lower leg: No edema.  Skin:    General: Skin is warm and dry.     Capillary Refill: Capillary refill takes less than 2 seconds.  Neurological:     Mental Status: He is alert. Mental status is at baseline.  Psychiatric:        Mood and Affect: Mood normal.        Behavior: Behavior normal.     ED Results / Procedures / Treatments   Labs (all labs ordered are listed, but only abnormal results are displayed) Labs Reviewed  RESP PANEL BY RT-PCR (RSV, FLU A&B, COVID)  RVPGX2 - Abnormal; Notable for the following components:      Result Value   SARS Coronavirus 2 by RT PCR POSITIVE (*)    All other components within normal limits  BASIC METABOLIC PANEL - Abnormal; Notable for the following components:   Potassium 3.4 (*)    Glucose, Bld 101 (*)    Creatinine, Ser 1.42 (*)    All other components within normal limits  CBC WITH DIFFERENTIAL/PLATELET - Abnormal; Notable for the following components:   HCT 37.9 (*)    All other components within normal limits  GROUP A STREP BY PCR  TROPONIN I (HIGH SENSITIVITY)  TROPONIN I (HIGH SENSITIVITY)    EKG EKG Interpretation  Date/Time:  Thursday May 11 2022 16:04:59 EST Ventricular Rate:  87 PR Interval:  144 QRS Duration: 90 QT Interval:  352 QTC Calculation: 424 R Axis:   18 Text Interpretation: Sinus rhythm RSR' in V1 or V2, right VCD or RVH  since last tracing no significant change Confirmed by Malvin Johns [phone]) on 05/11/2022 6:04:14 PM  Radiology DG Chest 2 View  Result Date: 05/11/2022 CLINICAL DATA:  Shortness of breath EXAM: CHEST - 2 VIEW COMPARISON:  02/06/2021 FINDINGS: Cardiac and mediastinal contours are within normal limits. No focal pulmonary opacity. No pleural effusion or pneumothorax. No acute osseous abnormality. IMPRESSION: No acute cardiopulmonary process. Electronically Signed   By: Francetta Found.D.  On: 05/11/2022 16:27    Procedures Procedures    Medications Ordered in ED Medications  potassium chloride SA (KLOR-CON M) CR tablet 40 mEq (has no administration in time range)  sodium chloride 0.9 % bolus 1,000 mL (0 mLs Intravenous Stopped 05/11/22 1903)    ED Course/ Medical Decision Making/ A&P             HEART Score: 4                Medical Decision Making Amount and/or Complexity of Data Reviewed Labs: ordered. Radiology: ordered.   This patient presents to the ED with chief complaint(s) of sore throat, cough, chest pain, dizziness and blurry vision with pertinent past medical history of MI, TIA, HTN, hyperlipidemia .The complaint involves an extensive differential diagnosis and also carries with it a high risk of complications and morbidity.    The differential diagnosis includes ACS, COVID, influenza, RSV, strep pharyngitis, viral pharyngitis, adenovirus, rhinovirus, pneumonia, pulmonary edema, CHF   The initial plan is to obtain baseline labs, chest pain work up including EKG, trop, and CXR  Additional history obtained: Additional history obtained from  none Records reviewed Primary Care Documents  Initial Assessment:   On exam, patient appears to be resting comfortably in bed and does not appear to be in acute distress.  Lungs are clear to auscultation bilaterally.  Heart rate is normal in the 80s with regular rhythm.  Murmur appreciated.  Abdomen is soft and non tender to palpation.   Skin is warm and dry with normal color.  Bilateral EACs and TMs unremarkable.  Posterior oropharynx erythematous with post-nasal drip, no exudate or swelling.  Patient has no difficulty swallowing or speaking.    Independent ECG/labs interpretation:  The following labs were independently interpreted:  COVID+, influenza and RSV negative  Group A strep negative  CBC without evidence of leukocytosis or anemia.  Metabolic panel with mild hypokalemia, elevated creatinine with preserved GFR and normal BUN, may be suggestive of dehydration.  No other electrolyte disturbances.    Independent visualization and interpretation of imaging: I independently visualized the following imaging with scope of interpretation limited to determining acute life threatening conditions related to emergency care: CXR, which revealed no evidence of pneumonia, pneumothorax or pleural effusion  Treatment and Reassessment: Patient has mildly elevated creatinine with GFR still above 60.  Will give IV fluids for rehydration, suspect he may be mildly dehydrated.  Upon reassessment, patient states that he feels better and his chest pain has resolved.  Patient was also given PO potassium for mild hypokalemia and sent home on prescription of potassium supplement with recommendation to follow-up with PCP.  Patient's symptoms are likely secondary to COVID-19 infection.  Discussed supportive care measures for symptomatic management at home.  Patient is hypertensive, however, he has not had his medications today.  Patient states he will take his BP meds as soon as he gets home.     Disposition:   The patient has been appropriately medically screened and/or stabilized in the ED. I have low suspicion for any other emergent medical condition which would require further screening, evaluation or treatment in the ED or require inpatient management. At time of discharge the patient is hemodynamically stable and in no acute distress. I have  discussed work-up results and diagnosis with patient and answered all questions. Patient is agreeable with discharge plan. We discussed strict return precautions for returning to the emergency department and they verbalized understanding.  Final Clinical Impression(s) / ED Diagnoses Final diagnoses:  COVID-19  Hypokalemia  Chest pain, unspecified type    Rx / DC Orders ED Discharge Orders          Ordered    potassium chloride SA (KLOR-CON M) 20 MEQ tablet  2 times daily        05/11/22 1952              Pat Kocher, [place] 05/11/22 1956    Malvin Johns, MD 05/11/22 2001

## 2022-05-11 NOTE — ED Triage Notes (Signed)
Patient here POV from Home.  Endorses Sore Throat that began yesterday. Associated with Blurry Vision, CP, and Lower Back Pain. Dry Cough.   NAD noted during Triage. A&Ox4. GCS 15. Ambulatory.

## 2022-05-11 NOTE — Discharge Instructions (Addendum)
Thank you for allowing me to be part of your care today.  You tested positive for COVID-19.  Your workup was overall reassuring.  Your potassium was slightly low, so I have sent a prescription to your pharmacy for potassium supplement.  I recommend having this rechecked by your primary care provider in a week.    I recommend taking Tylenol as needed for headache, body aches, or should you develop a fever.  You can take 1000 mg at a time. Do not exceed 4000 mg in a 24 hour period.    Return to the ED if you experience worsening of your symptoms or if you have any new concerns.

## 2022-05-18 ENCOUNTER — Telehealth: Payer: Self-pay

## 2022-05-18 DIAGNOSIS — Z79899 Other long term (current) drug therapy: Secondary | ICD-10-CM | POA: Diagnosis not present

## 2022-05-18 DIAGNOSIS — R7989 Other specified abnormal findings of blood chemistry: Secondary | ICD-10-CM | POA: Diagnosis not present

## 2022-05-18 DIAGNOSIS — I10 Essential (primary) hypertension: Secondary | ICD-10-CM | POA: Diagnosis not present

## 2022-05-18 DIAGNOSIS — U099 Post covid-19 condition, unspecified: Secondary | ICD-10-CM | POA: Diagnosis not present

## 2022-05-18 NOTE — Telephone Encounter (Signed)
     Patient  visit on 2/1  at [facility]   Have you been able to follow up with your primary care physician? Yes   The patient was or was not able to obtain any needed medicine or equipment. Yes   Are there diet recommendations that you are having difficulty following? Na   Patient expresses understanding of discharge instructions and education provided has no other needs at this time.  Yes      Shannon Wilson Pop [facility] Care Guide, [place] [phone] [address], [place], [place] [address] Phone: [phone] Email: [email or web address]    

## 2022-05-23 DIAGNOSIS — I10 Essential (primary) hypertension: Secondary | ICD-10-CM | POA: Diagnosis not present

## 2022-05-23 DIAGNOSIS — I776 Arteritis, unspecified: Secondary | ICD-10-CM | POA: Diagnosis not present

## 2022-05-23 DIAGNOSIS — G894 Chronic pain syndrome: Secondary | ICD-10-CM | POA: Diagnosis not present

## 2022-05-23 DIAGNOSIS — Z79899 Other long term (current) drug therapy: Secondary | ICD-10-CM | POA: Diagnosis not present

## 2022-05-23 DIAGNOSIS — R030 Elevated blood-pressure reading, without diagnosis of hypertension: Secondary | ICD-10-CM | POA: Diagnosis not present

## 2022-05-23 DIAGNOSIS — M545 Low back pain, unspecified: Secondary | ICD-10-CM | POA: Diagnosis not present

## 2022-05-23 NOTE — H&P (View-Only) (Signed)
 GI Office Note    Referring Provider: Hall, John Z, MD Primary Care Physician:  Hall, John Z, MD Primary Gastroenterologist: Charles K. Carver, DO  Date:  05/24/2022  ID:  Shannon Wilson, DOB 08/04/1970, MRN [id number]   Chief Complaint   Chief Complaint  Patient presents with   Colonoscopy    Ov prior to colonoscopy   History of Present Illness  Shannon Wilson is a 51 y.o. male with a history of ulcerative colitis (proctosigmoiditis), GERD, HLD, HTN, MI, TIA, vasculitis presenting today for evaluation prior to scheduling surveillance colonoscopy.  Colonoscopy 02/11/2021 for screening: -Inadequate preparation of the colon, visualization precluded by stool in the ascending colon and cecum. -Nonbleeding internal hemorrhoids -Diffuse mild inflammation in the rectosigmoid colon secondary to proctosigmoid colitis s/p biopsy -Biopsies with mildly active chronic proctocolitis, negative for granulomas or dysplasia.  Biopsies with crypt architectural distortion, full-thickness and basal lymphoplasmacytosis with mild cryptitis suggestive of idiopathic IBD (differential includes SCAD, infection, drug effect) -Recommend repeat colonoscopy in 6 months due to poor bowel prep  Last office visit 04/21/2021.  Reported prep was inadequate due to vomiting up some of the Suprep.  Patient did report having 3-4 bowel movements daily that are sometimes loose and its occasional blood and mucus in his stools.  Certain foods run right through him.  No family history of autoimmune disorders.  Ulcerative colitis discussed in depth with patient and his wife.  Disease extends approximately 20 cm from the rectum into the sigmoid colon.  CBC, CRP, CMP ordered.  Started on mesalamine enemas.  Plan to repeat colonoscopy in 6/12 months.  Do not use Suprep for bowel prep.  Today: When he eats something spicy or hot he may see a little blood in hs stools but otherwise does not. If he eats 3 times per day he will go 3 times per  day. Is not sure how long he took that mesalamine for and has been off if it for a while No abdominal pain, skin changes, chest pain, shortness of breath, edema. Denies any dark/black stools. No recent visoin changes.   Pain management told him to stop NSAID and he has avoided them. Also avoids aspirin powders. Had seen less blood after stopping NSAIDs however does state he has been taking meloxicam.   Has occasional reflux depending on what he eats or drinks. Sometimes OJ causes it. Admits to laying down after he eats. Does not take anything from it.   Reports he did have COVID 2 weeks prior, feels good but taste is just now starting to come back.   Reports he has hernia surgery to his right groin in July 2022. Has pain when it rains to that area.    Pain 7/10 right now, usually BP is up when he is pain.   Last flu shot: October 2023 Last pneumonia shot: None Last zoster vaccine: None COVID-19 shot: No vaccine - recent illness.    Current Outpatient Medications  Medication Sig Dispense Refill   amLODipine (NORVASC) 10 MG tablet Take 1 tablet (10 mg total) by mouth daily. 30 tablet 0   aspirin EC 81 MG tablet Take 1 tablet (81 mg total) by mouth 2 (two) times a week. (Patient taking differently: Take 81 mg by mouth daily at 6 (six) AM.)     baclofen (LIORESAL) 10 MG tablet Take 10 mg by mouth 3 (three) times daily as needed for muscle spasms.     cyclobenzaprine (FLEXERIL) 5 MG tablet Take 1 tablet (5 mg   total) by mouth at bedtime. (Patient taking differently: Take 5 mg by mouth at bedtime as needed for muscle spasms.) 20 tablet 0   gabapentin (NEURONTIN) 300 MG capsule Take 300 mg by mouth 3 (three) times daily as needed (pain).     lisinopril (ZESTRIL) 20 MG tablet Take 20 mg by mouth daily.     potassium chloride SA (KLOR-CON M) 20 MEQ tablet Take 1 tablet (20 mEq total) by mouth 2 (two) times daily. 14 tablet 0   rosuvastatin (CRESTOR) 20 MG tablet Take 20 mg by mouth at bedtime.      amoxicillin-clavulanate (AUGMENTIN) 875-125 MG tablet Take 1 tablet by mouth every 12 (twelve) hours. (Patient not taking: Reported on 04/21/2021) 20 tablet 0   ibuprofen (ADVIL) 800 MG tablet Take 800 mg by mouth every 8 (eight) hours as needed for moderate pain. (Patient not taking: Reported on 05/24/2022)     Mesalamine-Cleanser (ROWASA) 4 g KIT Place 1 kit (4 g total) rectally at bedtime. 30 kit 5   No current facility-administered medications for this visit.    Past Medical History:  Diagnosis Date   Back pain    GERD (gastroesophageal reflux disease)    Hyperlipidemia    Hypertension    Kidney stone    Lumbosacral radiculopathy at S1 09/04/2017   Migraine    Myocardial infarction (HCC)    TIA (transient ischemic attack)    Vasculitis (HCC)    celiac artery vasculitis    Past Surgical History:  Procedure Laterality Date   BACK SURGERY  08/2012   BIOPSY  02/11/2021   Procedure: BIOPSY;  Surgeon: Carver, Charles K, DO;  Location: [facility];  Service: Endoscopy;;   COLONOSCOPY WITH PROPOFOL N/A 02/11/2021   Procedure: COLONOSCOPY WITH PROPOFOL;  Surgeon: Carver, Charles K, DO;  Location: [facility];  Service: Endoscopy;  Laterality: N/A;  9:15 / ASA III   INGUINAL HERNIA REPAIR Right 10/18/2020   Procedure: HERNIA REPAIR INGUINAL ADULT W/MESH;  Surgeon: Bridges, Lindsay C, MD;  Location: [facility];  Service: General;  Laterality: Right;  pt knows to arrive at 6:15   SPINE SURGERY  08/2012   discectomy    Family History  Problem Relation Age of Onset   Hypertension Mother    Heart disease Mother 50   Cancer Father        prostate   Sickle cell trait Father    Sickle cell trait Son    Cancer Maternal Grandmother        bone   Cancer Paternal Grandmother    Cancer Paternal Grandfather        liver   Learning disabilities Neg Hx     Allergies as of 05/24/2022 - Review Complete 05/24/2022  Allergen Reaction Noted   Hydrocodone-acetaminophen Nausea And Vomiting  07/03/2020    Social History   Socioeconomic History   Marital status: Married    Spouse name: Not on file   Number of children: 4   Years of education: 12   Highest education level: Not on file  Occupational History   Occupation: disabled    Comment: heating and air  Tobacco Use   Smoking status: Never   Smokeless tobacco: Never  Vaping Use   Vaping Use: Never used  Substance and Sexual Activity   Alcohol use: No   Drug use: No   Sexual activity: Not Currently    Birth control/protection: None  Other Topics Concern   Not on file  Social History Narrative     Living with mother   Moved to [place] to be near family   Social Determinants of Health   Financial Resource Strain: Not on file  Food Insecurity: Not on file  Transportation Needs: Not on file  Physical Activity: Not on file  Stress: Not on file  Social Connections: Not on file     Review of Systems   Gen: Denies fever, chills, anorexia. Denies fatigue, weakness, weight loss.  CV: Denies chest pain, palpitations, syncope, peripheral edema, and claudication. Resp: Denies dyspnea at rest, cough, wheezing, coughing up blood, and pleurisy. GI: See HPI Derm: Denies rash, itching, dry skin Psych: Denies depression, anxiety, memory loss, confusion. No homicidal or suicidal ideation.  Heme: Denies bruising, bleeding, and enlarged lymph nodes.   Physical Exam   BP (!) 156/117   Pulse 87   Temp 98.2 F (36.8 C)   Ht 5' 11" (1.803 m)   Wt 195 lb 3.2 oz (88.5 kg)   BMI 27.22 kg/m   General:   Alert and oriented. No distress noted. Pleasant and cooperative.  Head:  Normocephalic and atraumatic. Eyes:  Conjuctiva clear without scleral icterus. Mouth:  Oral mucosa pink and moist. Good dentition. No lesions. Lungs:  Clear to auscultation bilaterally. No wheezes, rales, or rhonchi. No distress.  Heart:  S1, S2 present without murmurs appreciated.  Abdomen:  +BS, soft, non-distended.  Mild TTP to  periumbilical region and right lower quadrant (site of inguinal hernia repair). no rebound or guarding. No HSM or masses noted. Rectal: deferred Msk:  Symmetrical without gross deformities. Normal posture. Extremities:  Without edema. Neurologic:  Alert and  oriented x4 Psych:  Alert and cooperative. Normal mood and affect.   Assessment  Shannon Wilson is a 51 y.o. male with a history of ulcerative colitis (proctosigmoiditis), GERD, HLD, HTN, MI, TIA, vasculitis presenting today for evaluation prior to scheduling surveillance colonoscopy.  Ulcerative Colitis: Not currently taking any mesalamine enemas.  Unsure when he stopped taking.  Did notice improvement in bowel frequency when he was taking them.  Also noticed less blood in his stools when he stopped taking NSAIDs.  Currently reports only having blood in his stools when he eats something that is spicy.  Denies any abdominal pain or extraintestinal symptoms.  He does continue to have about 3 bowel movements daily or as many bowel movements as times that he eats in a day.  Stools are not watery but are loose usually looser.  I discussed more about disease process with him today, will provide a written handout for him as well.  We discussed that ulcerative colitis requires long-term management and is a lifelong condition.  Will restart mesalamine enemas nightly.  Encouraged him to continue to avoid NSAIDs, given that he was recently started back on meloxicam, encouraged him that this may need to be stopped as well.  We also discussed the importance of health maintenance.  He has had his flu shot this year and I recommend for him to get his pneumonia vaccine as well but encouraged him to wait 2 to 4 weeks given his recent COVID infection.  If any consideration of going on Biologics in the future discussed that he will need varicella/herpes vaccination.  Will proceed with surveillance colonoscopy of his ulcerative colitis given he is having some active  symptoms, likely secondary to cessation of medication.  Hypertension: The patient was found to have elevated blood pressure when vital signs were checked in the office. The blood pressure was rechecked by the nursing   staff and it was found be persistently elevated >140/90 mmHg. I personally advised to the patient to follow up closely with PCP for hypertension control.  PLAN   Proceed with colonoscopy with propofol by Dr. Carver  in near future: the risks, benefits, and alternatives have been discussed with the patient in detail. The patient states understanding and desires to proceed. ASA 3 Restart mesalamine enemas nightly, new prescription sent today. Avoid NSAIDs Discuss ongoing elevated BP with PCP Pneumonia vaccination in 2-4 weeks Follow-up 3 months, sooner if needed.    Joyanna Kleman, MSN, FNP-BC, AGACNP-BC [facility] 

## 2022-05-23 NOTE — Progress Notes (Unsigned)
GI Office Note    Referring Provider: Celene Squibb, MD Primary Care Physician:  Celene Squibb, MD Primary Gastroenterologist: Elon Alas. Abbey Chatters, DO  Date:  05/24/2022  ID:  Shannon Wilson, DOB 1970-05-18, MRN [id number]   Chief Complaint   Chief Complaint  Patient presents with   Colonoscopy    Ov prior to colonoscopy   History of Present Illness  Shannon Wilson is a 52 y.o. male with a history of ulcerative colitis (proctosigmoiditis), GERD, HLD, HTN, MI, TIA, vasculitis presenting today for evaluation prior to scheduling surveillance colonoscopy.  Colonoscopy 02/11/2021 for screening: -Inadequate preparation of the colon, visualization precluded by stool in the ascending colon and cecum. -Nonbleeding internal hemorrhoids -Diffuse mild inflammation in the rectosigmoid colon secondary to proctosigmoid colitis s/p biopsy -Biopsies with mildly active chronic proctocolitis, negative for granulomas or dysplasia.  Biopsies with crypt architectural distortion, full-thickness and basal lymphoplasmacytosis with mild cryptitis suggestive of idiopathic IBD (differential includes SCAD, infection, drug effect) -Recommend repeat colonoscopy in 6 months due to poor bowel prep  Last office visit 04/21/2021.  Reported prep was inadequate due to vomiting up some of the Suprep.  Patient did report having 3-4 bowel movements daily that are sometimes loose and its occasional blood and mucus in his stools.  Certain foods run right through him.  No family history of autoimmune disorders.  Ulcerative colitis discussed in depth with patient and his wife.  Disease extends approximately 20 cm from the rectum into the sigmoid colon.  CBC, CRP, CMP ordered.  Started on mesalamine enemas.  Plan to repeat colonoscopy in 6/12 months.  Do not use Suprep for bowel prep.  Today: When he eats something spicy or hot he may see a little blood in hs stools but otherwise does not. If he eats 3 times per day he will go 3 times per  day. Is not sure how long he took that mesalamine for and has been off if it for a while No abdominal pain, skin changes, chest pain, shortness of breath, edema. Denies any dark/black stools. No recent visoin changes.   Pain management told him to stop NSAID and he has avoided them. Also avoids aspirin powders. Had seen less blood after stopping NSAIDs however does state he has been taking meloxicam.   Has occasional reflux depending on what he eats or drinks. Sometimes OJ causes it. Admits to laying down after he eats. Does not take anything from it.   Reports he did have COVID 2 weeks prior, feels good but taste is just now starting to come back.   Reports he has hernia surgery to his right groin in July 2022. Has pain when it rains to that area.    Pain 7/10 right now, usually BP is up when he is pain.   Last flu shot: October 2023 Last pneumonia shot: None Last zoster vaccine: None COVID-19 shot: No vaccine - recent illness.    Current Outpatient Medications  Medication Sig Dispense Refill   amLODipine (NORVASC) 10 MG tablet Take 1 tablet (10 mg total) by mouth daily. 30 tablet 0   aspirin EC 81 MG tablet Take 1 tablet (81 mg total) by mouth 2 (two) times a week. (Patient taking differently: Take 81 mg by mouth daily at 6 (six) AM.)     baclofen (LIORESAL) 10 MG tablet Take 10 mg by mouth 3 (three) times daily as needed for muscle spasms.     cyclobenzaprine (FLEXERIL) 5 MG tablet Take 1 tablet (5 mg  total) by mouth at bedtime. (Patient taking differently: Take 5 mg by mouth at bedtime as needed for muscle spasms.) 20 tablet 0   gabapentin (NEURONTIN) 300 MG capsule Take 300 mg by mouth 3 (three) times daily as needed (pain).     lisinopril (ZESTRIL) 20 MG tablet Take 20 mg by mouth daily.     potassium chloride SA (KLOR-CON M) 20 MEQ tablet Take 1 tablet (20 mEq total) by mouth 2 (two) times daily. 14 tablet 0   rosuvastatin (CRESTOR) 20 MG tablet Take 20 mg by mouth at bedtime.      amoxicillin-clavulanate (AUGMENTIN) 875-125 MG tablet Take 1 tablet by mouth every 12 (twelve) hours. (Patient not taking: Reported on 04/21/2021) 20 tablet 0   ibuprofen (ADVIL) 800 MG tablet Take 800 mg by mouth every 8 (eight) hours as needed for moderate pain. (Patient not taking: Reported on 05/24/2022)     Mesalamine-Cleanser (ROWASA) 4 g KIT Place 1 kit (4 g total) rectally at bedtime. 30 kit 5   No current facility-administered medications for this visit.    Past Medical History:  Diagnosis Date   Back pain    GERD (gastroesophageal reflux disease)    Hyperlipidemia    Hypertension    Kidney stone    Lumbosacral radiculopathy at S1 09/04/2017   Migraine    Myocardial infarction [facility])    TIA (transient ischemic attack)    Vasculitis ([place])    celiac artery vasculitis    Past Surgical History:  Procedure Laterality Date   BACK SURGERY  08/2012   BIOPSY  02/11/2021   Procedure: BIOPSY;  Surgeon: Eloise Harman, DO;  Location: [facility];  Service: Endoscopy;;   COLONOSCOPY WITH PROPOFOL N/A 02/11/2021   Procedure: COLONOSCOPY WITH PROPOFOL;  Surgeon: Eloise Harman, DO;  Location: [facility];  Service: Endoscopy;  Laterality: N/A;  9:15 / ASA III   INGUINAL HERNIA REPAIR Right 10/18/2020   Procedure: HERNIA REPAIR INGUINAL ADULT W/MESH;  Surgeon: Virl Cagey, MD;  Location: [facility];  Service: General;  Laterality: Right;  pt knows to arrive at [place]  08/2012   discectomy    Family History  Problem Relation Age of Onset   Hypertension Mother    Heart disease Mother 80   Cancer Father        prostate   Sickle cell trait Father    Sickle cell trait Son    Cancer Maternal Grandmother        bone   Cancer Paternal Grandmother    Cancer Paternal Grandfather        liver   Learning disabilities Neg Hx     Allergies as of 05/24/2022 - Review Complete 05/24/2022  Allergen Reaction Noted   Hydrocodone-acetaminophen Nausea And Vomiting  07/03/2020    Social History   Socioeconomic History   Marital status: Married    Spouse name: Not on file   Number of children: 4   Years of education: 12   Highest education level: Not on file  Occupational History   Occupation: disabled    Comment: heating and air  Tobacco Use   Smoking status: Never   Smokeless tobacco: Never  Vaping Use   Vaping Use: Never used  Substance and Sexual Activity   Alcohol use: No   Drug use: No   Sexual activity: Not Currently    Birth control/protection: None  Other Topics Concern   Not on file  Social History Narrative  Living with mother   Moved to [place] to be near family   Social Determinants of Health   Financial Resource Strain: Not on file  Food Insecurity: Not on file  Transportation Needs: Not on file  Physical Activity: Not on file  Stress: Not on file  Social Connections: Not on file     Review of Systems   Gen: Denies fever, chills, anorexia. Denies fatigue, weakness, weight loss.  CV: Denies chest pain, palpitations, syncope, peripheral edema, and claudication. Resp: Denies dyspnea at rest, cough, wheezing, coughing up blood, and pleurisy. GI: See HPI Derm: Denies rash, itching, dry skin Psych: Denies depression, anxiety, memory loss, confusion. No homicidal or suicidal ideation.  Heme: Denies bruising, bleeding, and enlarged lymph nodes.   Physical Exam   BP (!) 156/117   Pulse 87   Temp 98.2 F (36.8 C)   Ht 5' 11"$  (1.803 m)   Wt 195 lb 3.2 oz (88.5 kg)   BMI 27.22 kg/m   General:   Alert and oriented. No distress noted. Pleasant and cooperative.  Head:  Normocephalic and atraumatic. Eyes:  Conjuctiva clear without scleral icterus. Mouth:  Oral mucosa pink and moist. Good dentition. No lesions. Lungs:  Clear to auscultation bilaterally. No wheezes, rales, or rhonchi. No distress.  Heart:  S1, S2 present without murmurs appreciated.  Abdomen:  +BS, soft, non-distended.  Mild TTP to  periumbilical region and right lower quadrant (site of inguinal hernia repair). no rebound or guarding. No HSM or masses noted. Rectal: deferred Msk:  Symmetrical without gross deformities. Normal posture. Extremities:  Without edema. Neurologic:  Alert and  oriented x4 Psych:  Alert and cooperative. Normal mood and affect.   Assessment  Shannon Wilson is a 52 y.o. male with a history of ulcerative colitis (proctosigmoiditis), GERD, HLD, HTN, MI, TIA, vasculitis presenting today for evaluation prior to scheduling surveillance colonoscopy.  Ulcerative Colitis: Not currently taking any mesalamine enemas.  Unsure when he stopped taking.  Did notice improvement in bowel frequency when he was taking them.  Also noticed less blood in his stools when he stopped taking NSAIDs.  Currently reports only having blood in his stools when he eats something that is spicy.  Denies any abdominal pain or extraintestinal symptoms.  He does continue to have about 3 bowel movements daily or as many bowel movements as times that he eats in a day.  Stools are not watery but are loose usually looser.  I discussed more about disease process with him today, will provide a written handout for him as well.  We discussed that ulcerative colitis requires long-term management and is a lifelong condition.  Will restart mesalamine enemas nightly.  Encouraged him to continue to avoid NSAIDs, given that he was recently started back on meloxicam, encouraged him that this may need to be stopped as well.  We also discussed the importance of health maintenance.  He has had his flu shot this year and I recommend for him to get his pneumonia vaccine as well but encouraged him to wait 2 to 4 weeks given his recent COVID infection.  If any consideration of going on Biologics in the future discussed that he will need varicella/herpes vaccination.  Will proceed with surveillance colonoscopy of his ulcerative colitis given he is having some active  symptoms, likely secondary to cessation of medication.  Hypertension: The patient was found to have elevated blood pressure when vital signs were checked in the office. The blood pressure was rechecked by the nursing  staff and it was found be persistently elevated >140/90 mmHg. I personally advised to the patient to follow up closely with PCP for hypertension control.  PLAN   Proceed with colonoscopy with propofol by Dr. Abbey Chatters  in near future: the risks, benefits, and alternatives have been discussed with the patient in detail. The patient states understanding and desires to proceed. ASA 3 Restart mesalamine enemas nightly, new prescription sent today. Avoid NSAIDs Discuss ongoing elevated BP with PCP Pneumonia vaccination in 2-4 weeks Follow-up 3 months, sooner if needed.    Venetia Night, MSN, FNP-BC, AGACNP-BC [facility]

## 2022-05-24 ENCOUNTER — Ambulatory Visit (INDEPENDENT_AMBULATORY_CARE_PROVIDER_SITE_OTHER): Payer: Medicare Other | Admitting: Gastroenterology

## 2022-05-24 ENCOUNTER — Encounter: Payer: Self-pay | Admitting: Gastroenterology

## 2022-05-24 ENCOUNTER — Other Ambulatory Visit: Payer: Self-pay | Admitting: *Deleted

## 2022-05-24 ENCOUNTER — Encounter: Payer: Self-pay | Admitting: *Deleted

## 2022-05-24 ENCOUNTER — Telehealth: Payer: Self-pay | Admitting: *Deleted

## 2022-05-24 VITALS — BP 156/117 | HR 87 | Temp 98.2°F | Ht 71.0 in | Wt 195.2 lb

## 2022-05-24 DIAGNOSIS — K921 Melena: Secondary | ICD-10-CM | POA: Diagnosis not present

## 2022-05-24 DIAGNOSIS — K5130 Ulcerative (chronic) rectosigmoiditis without complications: Secondary | ICD-10-CM | POA: Diagnosis not present

## 2022-05-24 DIAGNOSIS — I10 Essential (primary) hypertension: Secondary | ICD-10-CM | POA: Diagnosis not present

## 2022-05-24 MED ORDER — PEG 3350-KCL-NA BICARB-NACL 420 G PO SOLR: 4000.0000 mL | Freq: Once | ORAL | 0 refills | Status: AC

## 2022-05-24 MED ORDER — ROWASA 4 G RE KIT: 1.0000 | PACK | Freq: Every day | RECTAL | 5 refills | Status: DC

## 2022-05-24 NOTE — Telephone Encounter (Signed)
[employer] PA: APPROVED  Authorization #: [id number]  DOS: 06/15/22-09/17/22

## 2022-05-24 NOTE — Patient Instructions (Addendum)
We are scheduling you for a colonoscopy in the near future with Dr. Abbey Chatters.  Please restart taking your mesalamine enemas nightly.  Continue to avoid any NSAIDs (Aleve, Advil, ibuprofen) including any aspirin powders such as BC or Goody powders.  Meloxicam is considered an NSAID therefore he may need to discuss this with pain management as this could contribute to worsening colitis.  Please continue to check your blood pressure at home.  If it remains consistently elevated greater than 140/90 you should discuss this further with your primary care to help reduce your risk of recurrent cardiac complications.   I have attached a handout for you regarding more education on ulcerative colitis.  As we discussed this is a chronic disease that needs to be treated with long-term therapy.  You should consider pneumonia vaccination (PSV23) with your primary care in 2 to 4 weeks.  It was a pleasure to see you today. I want to create trusting relationships with patients. If you receive a survey regarding your visit,  I greatly appreciate you taking time to fill this out on paper or through your MyChart. I value your feedback.  Venetia Night, MSN, FNP-BC, AGACNP-BC [facility]

## 2022-06-15 ENCOUNTER — Encounter (HOSPITAL_COMMUNITY): Payer: Self-pay

## 2022-06-15 ENCOUNTER — Encounter (HOSPITAL_COMMUNITY)
Admission: RE | Admit: 2022-06-15 | Discharge: 2022-06-15 | Disposition: A | Discharge status: Home / Self Care | Payer: Medicare Other | Source: Ambulatory Visit | Attending: Internal Medicine | Admitting: Internal Medicine

## 2022-06-15 HISTORY — DX: Cerebral infarction, unspecified: I63.9

## 2022-06-15 NOTE — Patient Instructions (Signed)
Shannon Wilson  06/15/2022     '@PREFPERIOPPHARMACY'$ @   Your procedure is scheduled on 06/19/2022.  Report to [facility] at 6:30 A.M.  Call this number if you have problems the morning of surgery:  [phone]  If you experience any cold or flu symptoms such as cough, fever, chills, shortness of breath, etc. between now and your scheduled surgery, please notify us at the above number.   Remember:    Please Follow the diet and prep instructions given to you by Dr Ave Filter office.     Take these medicines the morning of surgery with A SIP OF WATER : Amlodipine Baclofen Gabapentin Mobic Tramadol (if Needed)    Do not wear jewelry, make-up or nail polish.  Do not wear lotions, powders, or perfumes, or deodorant.  Do not shave 48 hours prior to surgery.  Men may shave face and neck.  Do not bring valuables to the hospital.  [facility] is not responsible for any belongings or valuables.  Contacts, dentures or bridgework may not be worn into surgery.  Leave your suitcase in the car.  After surgery it may be brought to your room.  For patients admitted to the hospital, discharge time will be determined by your treatment team.  Patients discharged the day of surgery will not be allowed to drive home.   Name and phone number of your driver:   Family Special instructions:  N/A  Please read over the following fact sheets that you were given. Care and Recovery After Surgery  Colonoscopy, Adult A colonoscopy is a procedure to look at the entire large intestine. This procedure is done using a long, thin, flexible tube that has a camera on the end. You may have a colonoscopy: As a part of normal colorectal screening. If you have certain symptoms, such as: A low number of red blood cells in your blood (anemia). Diarrhea that does not go away. Pain in your abdomen. Blood in your stool. A colonoscopy can help screen for and diagnose medical problems, including: An abnormal growth of cells  or tissue (tumor). Abnormal growths within the lining of your intestine (polyps). Inflammation. Areas of bleeding. Tell your health care provider about: Any allergies you have. All medicines you are taking, including vitamins, herbs, eye drops, creams, and over-the-counter medicines. Any problems you or family members have had with anesthetic medicines. Any bleeding problems you have. Any surgeries you have had. Any medical conditions you have. Any problems you have had with having bowel movements. Whether you are pregnant or may be pregnant. What are the risks? Generally, this is a safe procedure. However, problems may occur, including: Bleeding. Damage to your intestine. Allergic reactions to medicines given during the procedure. Infection. This is rare. What happens before the procedure? Eating and drinking restrictions Follow instructions from your health care provider about eating or drinking restrictions, which may include: A few days before the procedure: Follow a low-fiber diet. Avoid nuts, seeds, dried fruit, raw fruits, and vegetables. 1-3 days before the procedure: Eat only gelatin dessert or ice pops. Drink only clear liquids, such as water, clear juice, clear broth or bouillon, black coffee or tea, or clear soft drinks or sports drinks. Avoid liquids that contain red or purple dye. The day of the procedure: Do not eat solid foods. You may continue to drink clear liquids until up to 2 hours before the procedure. Do not eat or drink anything starting 2 hours before the procedure, or within the time period  that your health care provider recommends. Bowel prep If you were prescribed a bowel prep to take by mouth (orally) to clean out your colon: Take it as told by your health care provider. Starting the day before your procedure, you will need to drink a large amount of liquid medicine. The liquid will cause you to have many bowel movements of loose stool until your stool  becomes almost clear or light green. If your skin or the opening between the buttocks (anus) gets irritated from diarrhea, you may relieve the irritation using: Wipes with medicine in them, such as adult wet wipes with aloe and vitamin E. A product to soothe skin, such as petroleum jelly. If you vomit while drinking the bowel prep: Take a break for up to 60 minutes. Begin the bowel prep again. Call your health care provider if you keep vomiting or you cannot take the bowel prep without vomiting. To clean out your colon, you may also be given: Laxative medicines. These help you have a bowel movement. Instructions for enema use. An enema is liquid medicine injected into your rectum. Medicines Ask your health care provider about: Changing or stopping your regular medicines or supplements. This is especially important if you are taking iron supplements, diabetes medicines, or blood thinners. Taking medicines such as aspirin and ibuprofen. These medicines can thin your blood. Do not take these medicines unless your health care provider tells you to take them. Taking over-the-counter medicines, vitamins, herbs, and supplements. General instructions Ask your health care provider what steps will be taken to help prevent infection. These may include washing skin with a germ-killing soap. If you will be going home right after the procedure, plan to have a responsible adult: Take you home from the hospital or clinic. You will not be allowed to drive. Care for you for the time you are told. What happens during the procedure?  An IV will be inserted into one of your veins. You will be given a medicine to make you fall asleep (general anesthetic). You will lie on your side with your knees bent. A lubricant will be put on the tube. Then the tube will be: Inserted into your anus. Gently eased through all parts of your large intestine. Air will be sent into your colon to keep it open. This may cause some  pressure or cramping. Images will be taken with the camera and will appear on a screen. A small tissue sample may be removed to be looked at under a microscope (biopsy). The tissue may be sent to a lab for testing if any signs of problems are found. If small polyps are found, they may be removed and checked for cancer cells. When the procedure is finished, the tube will be removed. The procedure may vary among health care providers and hospitals. What happens after the procedure? Your blood pressure, heart rate, breathing rate, and blood oxygen level will be monitored until you leave the hospital or clinic. You may have a small amount of blood in your stool. You may pass gas and have mild cramping or bloating in your abdomen. This is caused by the air that was used to open your colon during the exam. If you were given a sedative during the procedure, it can affect you for several hours. Do not drive or operate machinery until your health care provider says that it is safe. It is up to you to get the results of your procedure. Ask your health care provider, or the department  that is doing the procedure, when your results will be ready. Summary A colonoscopy is a procedure to look at the entire large intestine. Follow instructions from your health care provider about eating and drinking before the procedure. If you were prescribed an oral bowel prep to clean out your colon, take it as told by your health care provider. During the colonoscopy, a flexible tube with a camera on its end is inserted into the anus and then passed into all parts of the large intestine. This information is not intended to replace advice given to you by your health care provider. Make sure you discuss any questions you have with your health care provider. Document Revised: 03/21/2021 Document Reviewed: 11/17/2020 Elsevier Patient Education  [place] Anesthesia refers to the  techniques, procedures, and medicines that help a person stay safe and comfortable during surgery. Monitored anesthesia care, or sedation, is one type of anesthesia. You may have sedation if you do not need to be asleep for your procedure. Procedures that use sedation may include: Surgery to remove cataracts from your eyes. A dental procedure. A biopsy. This is when a tissue sample is removed and looked at under a microscope. You will be watched closely during your procedure. Your level of sedation or type of anesthesia may be changed to fit your needs. Tell a health care provider about: Any allergies you have. All medicines you are taking, including vitamins, herbs, eye drops, creams, and over-the-counter medicines. Any problems you or family members have had with anesthesia. Any bleeding problems you have. Any surgeries you have had. Any medical conditions or illnesses you have. This includes sleep apnea, cough, fever, or the flu. Whether you are pregnant or may be pregnant. Whether you use cigarettes, alcohol, or drugs. Any use of steroids, whether by mouth or as a cream. What are the risks? Your health care provider will talk with you about risks. These may include: Getting too much medicine (oversedation). Nausea. Allergic reactions to medicines. Trouble breathing. If this happens, a breathing tube may be used to help you breathe. It will be removed when you are awake and breathing on your own. Heart trouble. Lung trouble. Confusion that gets better with time (emergence delirium). What happens before the procedure? When to stop eating and drinking Follow instructions from your health care provider about what you may eat and drink. These may include: 8 hours before your procedure Stop eating most foods. Do not eat meat, fried foods, or fatty foods. Eat only light foods, such as toast or crackers. All liquids are okay except energy drinks and alcohol. 6 hours before your  procedure Stop eating. Drink only clear liquids, such as water, clear fruit juice, black coffee, plain tea, and sports drinks. Do not drink energy drinks or alcohol. 2 hours before your procedure Stop drinking all liquids. You may be allowed to take medicines with small sips of water. If you do not follow your health care provider's instructions, your procedure may be delayed or canceled. Medicines Ask your health care provider about: Changing or stopping your regular medicines. These include any diabetes medicines or blood thinners you take. Taking medicines such as aspirin and ibuprofen. These medicines can thin your blood. Do not take them unless your health care provider tells you to. Taking over-the-counter medicines, vitamins, herbs, and supplements. Testing You may have an exam or testing. You may have a blood or urine sample taken. General instructions Do not use any products that contain  nicotine or tobacco for at least 4 weeks before the procedure. These products include cigarettes, chewing tobacco, and vaping devices, such as e-cigarettes. If you need help quitting, ask your health care provider. If you will be going home right after the procedure, plan to have a responsible adult: Take you home from the hospital or clinic. You will not be allowed to drive. Care for you for the time you are told. What happens during the procedure?  Your blood pressure, heart rate, breathing, level of pain, and blood oxygen level will be monitored. An IV will be inserted into one of your veins. You may be given: A sedative. This helps you relax. Anesthesia. This will: Numb certain areas of your body. Make you fall asleep for surgery. You will be given medicines as needed to keep you comfortable. The more medicine you are given, the deeper your level of sedation will be. Your level of sedation may be changed to fit your needs. There are three levels of sedation: Mild sedation. At this level,  you may feel awake and relaxed. You will be able to follow directions. Moderate sedation. At this level, you will be sleepy. You may not remember the procedure. Deep sedation. At this level, you will be asleep. You will not remember the procedure. How you get the medicines will depend on your age and the procedure. They may be given as: A pill. This may be taken by mouth (orally) or inserted into the rectum. An injection. This may be into a vein or muscle. A spray through the nose. After your procedure is over, the medicine will be stopped. The procedure may vary among health care providers and hospitals. What happens after the procedure? Your blood pressure, heart rate, breathing rate, and blood oxygen level will be monitored until you leave the hospital or clinic. You may feel sleepy, clumsy, or nauseous. You may not remember what happened during or after the procedure. Sedation can affect you for several hours. Do not drive or use machinery until your health care provider says that it is safe. This information is not intended to replace advice given to you by your health care provider. Make sure you discuss any questions you have with your health care provider. Document Revised: 08/22/2021 Document Reviewed: 08/22/2021 Elsevier Patient Education  [place].

## 2022-06-19 ENCOUNTER — Encounter (HOSPITAL_COMMUNITY)
Admission: RE | Disposition: A | Discharge status: Home / Self Care | Payer: Self-pay | Source: Home / Self Care | Attending: Internal Medicine

## 2022-06-19 ENCOUNTER — Ambulatory Visit (HOSPITAL_COMMUNITY): Payer: Medicare Other | Admitting: Anesthesiology

## 2022-06-19 ENCOUNTER — Ambulatory Visit (HOSPITAL_BASED_OUTPATIENT_CLINIC_OR_DEPARTMENT_OTHER): Payer: Medicare Other | Admitting: Anesthesiology

## 2022-06-19 ENCOUNTER — Encounter (HOSPITAL_COMMUNITY): Payer: Self-pay

## 2022-06-19 ENCOUNTER — Ambulatory Visit (HOSPITAL_COMMUNITY)
Admission: RE | Admit: 2022-06-19 | Discharge: 2022-06-19 | Disposition: A | Discharge status: Home / Self Care | Payer: Medicare Other | Attending: Internal Medicine | Admitting: Internal Medicine

## 2022-06-19 DIAGNOSIS — E785 Hyperlipidemia, unspecified: Secondary | ICD-10-CM | POA: Insufficient documentation

## 2022-06-19 DIAGNOSIS — K648 Other hemorrhoids: Secondary | ICD-10-CM | POA: Diagnosis not present

## 2022-06-19 DIAGNOSIS — Z8673 Personal history of transient ischemic attack (TIA), and cerebral infarction without residual deficits: Secondary | ICD-10-CM | POA: Insufficient documentation

## 2022-06-19 DIAGNOSIS — Z8616 Personal history of COVID-19: Secondary | ICD-10-CM | POA: Insufficient documentation

## 2022-06-19 DIAGNOSIS — K5130 Ulcerative (chronic) rectosigmoiditis without complications: Secondary | ICD-10-CM | POA: Diagnosis not present

## 2022-06-19 DIAGNOSIS — K529 Noninfective gastroenteritis and colitis, unspecified: Secondary | ICD-10-CM | POA: Diagnosis not present

## 2022-06-19 DIAGNOSIS — K6289 Other specified diseases of anus and rectum: Secondary | ICD-10-CM | POA: Diagnosis not present

## 2022-06-19 DIAGNOSIS — I2510 Atherosclerotic heart disease of native coronary artery without angina pectoris: Secondary | ICD-10-CM

## 2022-06-19 DIAGNOSIS — I252 Old myocardial infarction: Secondary | ICD-10-CM | POA: Diagnosis not present

## 2022-06-19 DIAGNOSIS — I10 Essential (primary) hypertension: Secondary | ICD-10-CM | POA: Diagnosis not present

## 2022-06-19 HISTORY — PX: COLONOSCOPY WITH PROPOFOL: SHX5780

## 2022-06-19 HISTORY — PX: BIOPSY: SHX5522

## 2022-06-19 SURGERY — COLONOSCOPY WITH PROPOFOL
Anesthesia: General

## 2022-06-19 MED ORDER — PROPOFOL 500 MG/50ML IV EMUL
INTRAVENOUS | Status: DC | PRN
  Administered 2022-06-19: 200 ug/kg/min via INTRAVENOUS

## 2022-06-19 MED ORDER — LACTATED RINGERS IV SOLN: INTRAVENOUS | Status: DC | PRN

## 2022-06-19 MED ORDER — STERILE WATER FOR IRRIGATION IR SOLN
Status: DC | PRN
  Administered 2022-06-19: .6 mL

## 2022-06-19 MED ORDER — LIDOCAINE HCL (CARDIAC) PF 100 MG/5ML IV SOSY
PREFILLED_SYRINGE | INTRAVENOUS | Status: DC | PRN
  Administered 2022-06-19: 60 mg via INTRATRACHEAL

## 2022-06-19 MED ORDER — MESALAMINE 1.2 G PO TBEC: 4.8000 g | DELAYED_RELEASE_TABLET | Freq: Every day | ORAL | 11 refills | Status: DC

## 2022-06-19 MED ORDER — PROPOFOL 10 MG/ML IV BOLUS
INTRAVENOUS | Status: DC | PRN
  Administered 2022-06-19: 50 mg via INTRAVENOUS
  Administered 2022-06-19: 30 mg via INTRAVENOUS
  Administered 2022-06-19: 100 mg via INTRAVENOUS

## 2022-06-19 NOTE — Anesthesia Preprocedure Evaluation (Signed)
Anesthesia Evaluation  Patient identified by MRN, date of birth, ID band Patient awake    Reviewed: Allergy & Precautions, H&P , NPO status , Patient's Chart, lab work & pertinent test results, reviewed documented beta blocker date and time   Airway Mallampati: II  TM Distance: >3 FB Neck ROM: full    Dental no notable dental hx.    Pulmonary neg pulmonary ROS   Pulmonary exam normal breath sounds clear to auscultation       Cardiovascular Exercise Tolerance: Good hypertension, + CAD and + Past MI  negative cardio ROS  Rhythm:regular Rate:Normal     Neuro/Psych  Headaches TIA Neuromuscular disease CVA negative neurological ROS  negative psych ROS   GI/Hepatic negative GI ROS, Neg liver ROS,GERD  ,,  Endo/Other  negative endocrine ROS    Renal/GU Renal diseasenegative Renal ROS  negative genitourinary   Musculoskeletal   Abdominal   Peds  Hematology negative hematology ROS (+)   Anesthesia Other Findings   Reproductive/Obstetrics negative OB ROS                             Anesthesia Physical Anesthesia Plan  ASA: 3  Anesthesia Plan: General   Post-op Pain Management:    Induction:   PONV Risk Score and Plan: Propofol infusion  Airway Management Planned:   Additional Equipment:   Intra-op Plan:   Post-operative Plan:   Informed Consent: I have reviewed the patients History and Physical, chart, labs and discussed the procedure including the risks, benefits and alternatives for the proposed anesthesia with the patient or authorized representative who has indicated his/her understanding and acceptance.     Dental Advisory Given  Plan Discussed with: CRNA  Anesthesia Plan Comments:        Anesthesia Quick Evaluation

## 2022-06-19 NOTE — Transfer of Care (Signed)
Immediate Anesthesia Transfer of Care Note  Patient: Shannon Wilson  Procedure(s) Performed: COLONOSCOPY WITH PROPOFOL BIOPSY  Patient Location: Short Stay  Anesthesia Type:General  Level of Consciousness: sedated  Airway & Oxygen Therapy: Patient Spontanous Breathing  Post-op Assessment: Report given to RN and Post -op Vital signs reviewed and stable  Post vital signs: Reviewed and stable  Last Vitals:  Vitals Value Taken Time  BP 113/74   Temp 98   Pulse 89   Resp 16   SpO2 98     Last Pain:  Vitals:   06/19/22 0851  TempSrc:   PainSc: 0-No pain      Patients Stated Pain Goal: 6 [id number] [phone]  Complications: No notable events documented.

## 2022-06-19 NOTE — Anesthesia Postprocedure Evaluation (Signed)
Anesthesia Post Note  Patient: Shannon Wilson  Procedure(s) Performed: COLONOSCOPY WITH PROPOFOL BIOPSY  Patient location during evaluation: Phase II Anesthesia Type: General Level of consciousness: awake Pain management: pain level controlled Vital Signs Assessment: post-procedure vital signs reviewed and stable Respiratory status: spontaneous breathing and respiratory function stable Cardiovascular status: blood pressure returned to baseline and stable Postop Assessment: no headache and no apparent nausea or vomiting Anesthetic complications: no Comments: Late entry   No notable events documented.   Last Vitals:  Vitals:   06/19/22 0909 06/19/22 0915  BP: 103/63 106/77  Pulse: 91   Resp: 16   Temp: 36.8 C   SpO2: 100%     Last Pain:  Vitals:   06/19/22 0915  TempSrc:   PainSc: 0-No pain                 Louann Sjogren

## 2022-06-19 NOTE — Op Note (Signed)
[facility] Patient Name: Shannon Wilson Procedure Date: 06/19/2022 8:40 AM MRN: [id number] Date of Birth: 18-Jun-1970 Attending MD: Elon Alas. Abbey Chatters , [place], [id number] CSN: [id number] Age: 52 Admit Type: Outpatient Procedure:                Colonoscopy Indications:              Follow-up of chronic ulcerative proctosigmoiditis Providers:                Elon Alas. Abbey Chatters, DO, Janeece Riggers, RN, Aram Candela Referring MD:              Medicines:                See the Anesthesia note for documentation of the                            administered medications Complications:            No immediate complications. Estimated Blood Loss:     Estimated blood loss was minimal. Procedure:                Pre-Anesthesia Assessment:                           - The anesthesia plan was to use monitored                            anesthesia care (MAC).                           After obtaining informed consent, the colonoscope                            was passed under direct vision. Throughout the                            procedure, the patient's blood pressure, pulse, and                            oxygen saturations were monitored continuously. The                            [id number] scope was introduced through                            the anus and advanced to the the cecum, identified                            by appendiceal orifice and ileocecal valve. The                            colonoscopy was performed without difficulty. The                            patient tolerated the procedure well. The quality  of the bowel preparation was evaluated using the                            BBPS [facility] Bowel Preparation Scale) with scores                            of: Right Colon = 3, Transverse Colon = 3 and Left                            Colon = 3 (entire mucosa seen well with no residual                            staining, small fragments of stool or  opaque                            liquid). The total BBPS score equals 9. Scope In: 8:53:32 AM Scope Out: 9:09:42 AM Scope Withdrawal Time: 0 hours 12 minutes 30 seconds  Total Procedure Duration: 0 hours 16 minutes 10 seconds  Findings:      The perianal and digital rectal examinations were normal.      Non-bleeding internal hemorrhoids were found during endoscopy.      Inflammation characterized by congestion (edema), erosions and erythema       was found (patchy) noted from the rectum to the sigmoid colon extending       approx 24 cm from anal verge. The inflammation was mild in severity, and       when compared to previous examinations, the findings are unchanged.       Biopsies were taken with a cold forceps for histology. Impression:               - Non-bleeding internal hemorrhoids.                           - Proctosigmoid ulcerative colitis. Inflammation                            was found in the colon. This was mild in severity,                            unchanged compared to previous examinations.                            Biopsied. Moderate Sedation:      Per Anesthesia Care Recommendation:           - Patient has a contact number available for                            emergencies. The signs and symptoms of potential                            delayed complications were discussed with the                            patient. Return to normal activities tomorrow.  Written discharge instructions were provided to the                            patient.                           - Resume previous diet.                           - Continue present medications.                           - Await pathology results.                           - Repeat colonoscopy date to be determined after                            pending pathology results are reviewed for                            surveillance based on pathology results.                            - Return to GI clinic in 3 months.                           - Continue Meslamine enemas qhs, compliance appears                            to be an issue. If so will switch to Oral                            formulation. Consider upgrading to biologic if                            remission not achieved. 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 All rights reserved. The codes documented in this report are preliminary and upon coder review may  be revised to meet current compliance requirements. Elon Alas. Abbey Chatters, DO Juanda Crumble  Arlee Muslim, DO 06/19/2022 9:14:53 AM This report has been signed electronically. Number of Addenda: 0

## 2022-06-19 NOTE — Discharge Instructions (Addendum)
  Colonoscopy Discharge Instructions  Read the instructions outlined below and refer to this sheet in the next few weeks. These discharge instructions provide you with general information on caring for yourself after you leave the hospital. Your doctor may also give you specific instructions. While your treatment has been planned according to the most current medical practices available, unavoidable complications occasionally occur.   ACTIVITY You may resume your regular activity, but move at a slower pace for the next 24 hours.  Take frequent rest periods for the next 24 hours.  Walking will help get rid of the air and reduce the bloated feeling in your belly (abdomen).  No driving for 24 hours (because of the medicine (anesthesia) used during the test).   Do not sign any important legal documents or operate any machinery for 24 hours (because of the anesthesia used during the test).  NUTRITION Drink plenty of fluids.  You may resume your normal diet as instructed by your doctor.  Begin with a light meal and progress to your normal diet. Heavy or fried foods are harder to digest and may make you feel sick to your stomach (nauseated).  Avoid alcoholic beverages for 24 hours or as instructed.  MEDICATIONS You may resume your normal medications unless your doctor tells you otherwise.  WHAT YOU CAN EXPECT TODAY Some feelings of bloating in the abdomen.  Passage of more gas than usual.  Spotting of blood in your stool or on the toilet paper.  IF YOU HAD POLYPS REMOVED DURING THE COLONOSCOPY: No aspirin products for 7 days or as instructed.  No alcohol for 7 days or as instructed.  Eat a soft diet for the next 24 hours.  FINDING OUT THE RESULTS OF YOUR TEST Not all test results are available during your visit. If your test results are not back during the visit, make an appointment with your caregiver to find out the results. Do not assume everything is normal if you have not heard from your  caregiver or the medical facility. It is important for you to follow up on all of your test results.  SEEK IMMEDIATE MEDICAL ATTENTION IF: You have more than a spotting of blood in your stool.  Your belly is swollen (abdominal distention).  You are nauseated or vomiting.  You have a temperature over 101.  You have abdominal pain or discomfort that is severe or gets worse throughout the day.   Your colonoscopy revealed inflammation in your rectum and sigmoid colon largely unchanged from prior exam.  Your ulcerative colitis is active.  We need to get this under better control.  If using the enemas nightly is difficult, I can switch you to oral formulation of mesalamine.  Though not quite as effective with where your disease process is located, may be better to switch if you can more consistently take medication this route. No polyps or evidence of colon cancer.  Follow-up with GI in 2 to 3 months.  I hope you have a great rest of your week!  Elon Alas. Abbey Chatters, D.O. Gastroenterology and Hepatology [facility]

## 2022-06-19 NOTE — Interval H&P Note (Signed)
History and Physical Interval Note:  06/19/2022 8:24 AM  Shannon Wilson  has presented today for surgery, with the diagnosis of ulcerative colitis.  The various methods of treatment have been discussed with the patient and family. After consideration of risks, benefits and other options for treatment, the patient has consented to  Procedure(s) with comments: COLONOSCOPY WITH PROPOFOL (N/A) - 8:30 am   asa 3 as a surgical intervention.  The patient's history has been reviewed, patient examined, no change in status, stable for surgery.  I have reviewed the patient's chart and labs.  Questions were answered to the patient's satisfaction.     Eloise Harman

## 2022-06-21 LAB — SURGICAL PATHOLOGY

## 2022-06-26 DIAGNOSIS — R030 Elevated blood-pressure reading, without diagnosis of hypertension: Secondary | ICD-10-CM | POA: Diagnosis not present

## 2022-06-26 DIAGNOSIS — Z79899 Other long term (current) drug therapy: Secondary | ICD-10-CM | POA: Diagnosis not present

## 2022-06-26 DIAGNOSIS — I10 Essential (primary) hypertension: Secondary | ICD-10-CM | POA: Diagnosis not present

## 2022-06-26 DIAGNOSIS — M545 Low back pain, unspecified: Secondary | ICD-10-CM | POA: Diagnosis not present

## 2022-06-26 DIAGNOSIS — G894 Chronic pain syndrome: Secondary | ICD-10-CM | POA: Diagnosis not present

## 2022-06-26 DIAGNOSIS — I776 Arteritis, unspecified: Secondary | ICD-10-CM | POA: Diagnosis not present

## 2022-06-28 ENCOUNTER — Encounter (HOSPITAL_COMMUNITY): Payer: Self-pay | Admitting: Internal Medicine

## 2022-06-28 DIAGNOSIS — E785 Hyperlipidemia, unspecified: Secondary | ICD-10-CM | POA: Diagnosis not present

## 2022-06-28 DIAGNOSIS — I10 Essential (primary) hypertension: Secondary | ICD-10-CM | POA: Diagnosis not present

## 2022-06-28 DIAGNOSIS — R7301 Impaired fasting glucose: Secondary | ICD-10-CM | POA: Diagnosis not present

## 2022-06-29 DIAGNOSIS — E870 Hyperosmolality and hypernatremia: Secondary | ICD-10-CM | POA: Diagnosis not present

## 2022-06-29 DIAGNOSIS — E785 Hyperlipidemia, unspecified: Secondary | ICD-10-CM | POA: Diagnosis not present

## 2022-06-29 DIAGNOSIS — R809 Proteinuria, unspecified: Secondary | ICD-10-CM | POA: Diagnosis not present

## 2022-06-29 DIAGNOSIS — R7303 Prediabetes: Secondary | ICD-10-CM | POA: Diagnosis not present

## 2022-06-29 DIAGNOSIS — K4090 Unilateral inguinal hernia, without obstruction or gangrene, not specified as recurrent: Secondary | ICD-10-CM | POA: Diagnosis not present

## 2022-06-29 DIAGNOSIS — K219 Gastro-esophageal reflux disease without esophagitis: Secondary | ICD-10-CM | POA: Diagnosis not present

## 2022-06-29 DIAGNOSIS — K122 Cellulitis and abscess of mouth: Secondary | ICD-10-CM | POA: Diagnosis not present

## 2022-06-29 DIAGNOSIS — M546 Pain in thoracic spine: Secondary | ICD-10-CM | POA: Diagnosis not present

## 2022-06-29 DIAGNOSIS — Z0001 Encounter for general adult medical examination with abnormal findings: Secondary | ICD-10-CM | POA: Diagnosis not present

## 2022-06-29 DIAGNOSIS — I10 Essential (primary) hypertension: Secondary | ICD-10-CM | POA: Diagnosis not present

## 2022-06-29 DIAGNOSIS — K5130 Ulcerative (chronic) rectosigmoiditis without complications: Secondary | ICD-10-CM | POA: Diagnosis not present

## 2022-07-17 DIAGNOSIS — L239 Allergic contact dermatitis, unspecified cause: Secondary | ICD-10-CM | POA: Diagnosis not present

## 2022-07-24 DIAGNOSIS — R030 Elevated blood-pressure reading, without diagnosis of hypertension: Secondary | ICD-10-CM | POA: Diagnosis not present

## 2022-07-24 DIAGNOSIS — Z79899 Other long term (current) drug therapy: Secondary | ICD-10-CM | POA: Diagnosis not present

## 2022-07-24 DIAGNOSIS — G894 Chronic pain syndrome: Secondary | ICD-10-CM | POA: Diagnosis not present

## 2022-07-24 DIAGNOSIS — M545 Low back pain, unspecified: Secondary | ICD-10-CM | POA: Diagnosis not present

## 2022-07-24 DIAGNOSIS — I10 Essential (primary) hypertension: Secondary | ICD-10-CM | POA: Diagnosis not present

## 2022-07-24 DIAGNOSIS — I776 Arteritis, unspecified: Secondary | ICD-10-CM | POA: Diagnosis not present

## 2022-07-27 ENCOUNTER — Emergency Department (HOSPITAL_COMMUNITY): Payer: Medicare Other

## 2022-07-27 ENCOUNTER — Encounter (HOSPITAL_COMMUNITY): Payer: Self-pay

## 2022-07-27 ENCOUNTER — Emergency Department (HOSPITAL_COMMUNITY)
Admission: EM | Admit: 2022-07-27 | Discharge: 2022-07-27 | Disposition: A | Discharge status: Home / Self Care | Payer: Medicare Other | Attending: Emergency Medicine | Admitting: Emergency Medicine

## 2022-07-27 ENCOUNTER — Other Ambulatory Visit: Payer: Self-pay

## 2022-07-27 DIAGNOSIS — Z79899 Other long term (current) drug therapy: Secondary | ICD-10-CM | POA: Insufficient documentation

## 2022-07-27 DIAGNOSIS — R29810 Facial weakness: Secondary | ICD-10-CM | POA: Insufficient documentation

## 2022-07-27 DIAGNOSIS — G43109 Migraine with aura, not intractable, without status migrainosus: Secondary | ICD-10-CM | POA: Diagnosis not present

## 2022-07-27 DIAGNOSIS — R29818 Other symptoms and signs involving the nervous system: Secondary | ICD-10-CM | POA: Diagnosis not present

## 2022-07-27 DIAGNOSIS — R202 Paresthesia of skin: Secondary | ICD-10-CM | POA: Insufficient documentation

## 2022-07-27 DIAGNOSIS — Z7982 Long term (current) use of aspirin: Secondary | ICD-10-CM | POA: Diagnosis not present

## 2022-07-27 DIAGNOSIS — R200 Anesthesia of skin: Secondary | ICD-10-CM | POA: Diagnosis not present

## 2022-07-27 DIAGNOSIS — G43809 Other migraine, not intractable, without status migrainosus: Secondary | ICD-10-CM | POA: Insufficient documentation

## 2022-07-27 DIAGNOSIS — I10 Essential (primary) hypertension: Secondary | ICD-10-CM | POA: Insufficient documentation

## 2022-07-27 DIAGNOSIS — R9082 White matter disease, unspecified: Secondary | ICD-10-CM | POA: Diagnosis not present

## 2022-07-27 LAB — CBC
HCT: 38.4 % — ABNORMAL LOW (ref 39.0–52.0)
Hemoglobin: 13.3 g/dL (ref 13.0–17.0)
MCH: 30.2 pg (ref 26.0–34.0)
MCHC: 34.6 g/dL (ref 30.0–36.0)
MCV: 87.1 fL (ref 80.0–100.0)
Platelets: 251 10*3/uL (ref 150–400)
RBC: 4.41 MIL/uL (ref 4.22–5.81)
RDW: 12.9 % (ref 11.5–15.5)
WBC: 6.3 10*3/uL (ref 4.0–10.5)
nRBC: 0 % (ref 0.0–0.2)

## 2022-07-27 LAB — COMPREHENSIVE METABOLIC PANEL
ALT: 23 U/L (ref 0–44)
AST: 23 U/L (ref 15–41)
Albumin: 4 g/dL (ref 3.5–5.0)
Alkaline Phosphatase: 101 U/L (ref 38–126)
Anion gap: 8 (ref 5–15)
BUN: 18 mg/dL (ref 6–20)
CO2: 23 mmol/L (ref 22–32)
Calcium: 8.6 mg/dL — ABNORMAL LOW (ref 8.9–10.3)
Chloride: 107 mmol/L (ref 98–111)
Creatinine, Ser: 1.2 mg/dL (ref 0.61–1.24)
GFR, Estimated: >60 mL/min (ref >60)
Glucose, Bld: 140 mg/dL — ABNORMAL HIGH (ref 70–99)
Potassium: 2.9 mmol/L — ABNORMAL LOW (ref 3.5–5.1)
Sodium: 138 mmol/L (ref 135–145)
Total Bilirubin: 0.4 mg/dL (ref 0.3–1.2)
Total Protein: 7 g/dL (ref 6.5–8.1)

## 2022-07-27 LAB — RAPID URINE DRUG SCREEN, HOSP PERFORMED
Amphetamines: NOT DETECTED
Barbiturates: NOT DETECTED
Benzodiazepines: NOT DETECTED
Cocaine: NOT DETECTED
Opiates: NOT DETECTED
Tetrahydrocannabinol: NOT DETECTED

## 2022-07-27 LAB — DIFFERENTIAL
Abs Immature Granulocytes: 0.01 10*3/uL (ref 0.00–0.07)
Basophils Absolute: 0 10*3/uL (ref 0.0–0.1)
Basophils Relative: 1 %
Eosinophils Absolute: 1.2 10*3/uL — ABNORMAL HIGH (ref 0.0–0.5)
Eosinophils Relative: 18 %
Immature Granulocytes: 0 %
Lymphocytes Relative: 34 %
Lymphs Abs: 2.1 10*3/uL (ref 0.7–4.0)
Monocytes Absolute: 0.4 10*3/uL (ref 0.1–1.0)
Monocytes Relative: 7 %
Neutro Abs: 2.5 10*3/uL (ref 1.7–7.7)
Neutrophils Relative %: 40 %

## 2022-07-27 LAB — I-STAT CHEM 8, ED
BUN: 17 mg/dL (ref 6–20)
Calcium, Ion: 1.21 mmol/L (ref 1.15–1.40)
Chloride: 107 mmol/L (ref 98–111)
Creatinine, Ser: 1.3 mg/dL — ABNORMAL HIGH (ref 0.61–1.24)
Glucose, Bld: 140 mg/dL — ABNORMAL HIGH (ref 70–99)
HCT: 37 % — ABNORMAL LOW (ref 39.0–52.0)
Hemoglobin: 12.6 g/dL — ABNORMAL LOW (ref 13.0–17.0)
Potassium: 3 mmol/L — ABNORMAL LOW (ref 3.5–5.1)
Sodium: 144 mmol/L (ref 135–145)
TCO2: 26 mmol/L (ref 22–32)

## 2022-07-27 LAB — ETHANOL: Alcohol, Ethyl (B): <10 mg/dL (ref <10)

## 2022-07-27 LAB — CBG MONITORING, ED: Glucose-Capillary: 158 mg/dL — ABNORMAL HIGH (ref 70–99)

## 2022-07-27 LAB — PROTIME-INR
INR: 1 (ref 0.8–1.2)
Prothrombin Time: 12.9 seconds (ref 11.4–15.2)

## 2022-07-27 LAB — APTT: aPTT: 26 seconds (ref 24–36)

## 2022-07-27 MED ORDER — IBUPROFEN 600 MG PO TABS: 600.0000 mg | ORAL_TABLET | Freq: Three times a day (TID) | ORAL | 0 refills | Status: DC | PRN

## 2022-07-27 MED ORDER — DIPHENHYDRAMINE HCL 50 MG/ML IJ SOLN
25.0000 mg | Freq: Once | INTRAMUSCULAR | Status: AC
  Administered 2022-07-27: 25 mg via INTRAVENOUS
  Filled 2022-07-27: qty 1

## 2022-07-27 MED ORDER — IOHEXOL 350 MG/ML SOLN
75.0000 mL | Freq: Once | INTRAVENOUS | Status: AC | PRN
  Administered 2022-07-27: 75 mL via INTRAVENOUS

## 2022-07-27 MED ORDER — PROCHLORPERAZINE EDISYLATE 10 MG/2ML IJ SOLN
10.0000 mg | Freq: Once | INTRAMUSCULAR | Status: AC
  Administered 2022-07-27: 10 mg via INTRAVENOUS
  Filled 2022-07-27: qty 2

## 2022-07-27 NOTE — Progress Notes (Signed)
1534 call time 1549 exam started 1549 exam finished 1551 exam completed in epic 1551 [facility] radiology called

## 2022-07-27 NOTE — ED Notes (Signed)
Called CT CODE STROKE @ 1532 and beeped out to CT and Lab @ 1533.

## 2022-07-27 NOTE — ED Triage Notes (Addendum)
Patient from home for left sided facial numbness and left arm numbness that started "after lunch". Grips equal and strong; slight L facial droop. Upon arrival to ER, patient is alert and oriented, ambu. Provider in to see patient during triage

## 2022-07-27 NOTE — Discharge Instructions (Signed)
You are seen in the emergency room for numbness and subtle facial droop.  MRI of your brain is negative for stroke.  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  Please call the neurologist at the phone number provided to set up an appointment.

## 2022-07-27 NOTE — ED Provider Notes (Signed)
[place] EMERGENCY DEPARTMENT AT [facility] Provider Note   CSN: [id number] Arrival date & time: 07/27/22  1517  An emergency department physician performed an initial assessment on this suspected stroke patient at 1540.  History  Chief Complaint  Patient presents with   Numbness    Nashid Pellum is a 52 y.o. male.  HPI     52 year old male comes in with chief complaint of sided facial numbness, left arm numbness.  Patient states that his symptoms started getting the symptoms sometime after lunch.  He thinks that he was last normal around 12 p.m.  Patient has history of hypertension, hyperlipidemia.  He denies any history of stroke.  Social history is negative for substance use disorder, heavy smoking.  Patient also feels that he has subtle facial asymmetry. Review of system is also positive for headache with photophobia.  No history of migraine.   Home Medications Prior to Admission medications   Medication Sig Start Date End Date Taking? Authorizing Provider  amLODipine (NORVASC) 10 MG tablet Take 1 tablet (10 mg total) by mouth daily. 11/03/18  Yes Terrilee Files, MD  aspirin EC 81 MG tablet Take 1 tablet (81 mg total) by mouth 2 (two) times a week. 10/18/20  Yes Lucretia Roers, MD  baclofen (LIORESAL) 10 MG tablet Take 10 mg by mouth 3 (three) times daily as needed for muscle spasms.   Yes [provider]  gabapentin (NEURONTIN) 300 MG capsule Take 300 mg by mouth 3 (three) times daily as needed (nerve pain).   Yes [provider]  ibuprofen (ADVIL) 600 MG tablet Take 1 tablet (600 mg total) by mouth every 8 (eight) hours as needed. 07/27/22  Yes Dailynn Nancarrow, MD  lisinopril (ZESTRIL) 40 MG tablet Take 40 mg by mouth daily.   Yes [provider]  mesalamine (LIALDA) 1.2 g EC tablet Take 4 tablets (4.8 g total) by mouth daily with breakfast. Take 4 tablets daily for 2 months then decrease to 2 tablets daily. 06/19/22 06/19/23 Yes  Carver, Charles K, DO  pantoprazole (PROTONIX) 40 MG tablet Take 40 mg by mouth daily.   Yes [provider]  rosuvastatin (CRESTOR) 20 MG tablet Take 20 mg by mouth at bedtime. 07/21/20  Yes [provider]  potassium chloride SA (KLOR-CON M) 20 MEQ tablet Take 1 tablet (20 mEq total) by mouth 2 (two) times daily. Patient not taking: Reported on 06/12/2022 05/11/22   Melton Alar R, PA-C      Allergies    Hydrocodone-acetaminophen and Triamcinolone    Review of Systems   Review of Systems  All other systems reviewed and are negative.   Physical Exam Updated Vital Signs BP (!) 149/119   Pulse 81   Temp 98.6 F (37 C) (Oral)   Resp 15   Ht  (1.803 m)   Wt 87.5 kg   SpO2 98%   BMI 26.92 kg/m  Physical Exam Vitals and nursing note reviewed.  Constitutional:      Appearance: He is well-developed.  HENT:     Head: Atraumatic.  Eyes:     Extraocular Movements: Extraocular movements intact.     Pupils: Pupils are equal, round, and reactive to light.  Cardiovascular:     Rate and Rhythm: Normal rate.  Pulmonary:     Effort: Pulmonary effort is normal.  Musculoskeletal:     Cervical back: Neck supple.  Skin:    General: Skin is warm.  Neurological:  Mental Status: He is alert and oriented to person, place, and time.     Comments: Left-sided facial droop, subjective numbness to the left upper extremity, left face     ED Results / Procedures / Treatments   Labs (all labs ordered are listed, but only abnormal results are displayed) Labs Reviewed  CBC - Abnormal; Notable for the following components:      Result Value   HCT 38.4 (*)    All other components within normal limits  DIFFERENTIAL - Abnormal; Notable for the following components:   Eosinophils Absolute 1.2 (*)    All other components within normal limits  COMPREHENSIVE METABOLIC PANEL - Abnormal; Notable for the following components:   Potassium 2.9 (*)    Glucose, Bld 140 (*)     Calcium 8.6 (*)    All other components within normal limits  I-STAT CHEM 8, ED - Abnormal; Notable for the following components:   Potassium 3.0 (*)    Creatinine, Ser 1.30 (*)    Glucose, Bld 140 (*)    Hemoglobin 12.6 (*)    HCT 37.0 (*)    All other components within normal limits  CBG MONITORING, ED - Abnormal; Notable for the following components:   Glucose-Capillary 158 (*)    All other components within normal limits  ETHANOL  PROTIME-INR  APTT  RAPID URINE DRUG SCREEN, HOSP PERFORMED    EKG EKG Interpretation  Date/Time:  Thursday July 27 2022 15:37:10 EDT Ventricular Rate:  81 PR Interval:  148 QRS Duration: 99 QT Interval:  392 QTC Calculation: 455 R Axis:   -5 Text Interpretation: Sinus rhythm RSR' in V1 or V2, right VCD or RVH No acute changes No significant change since last tracing Confirmed by Derwood Kaplan [phone]) on 07/27/2022 7:11:54 PM  Radiology MR BRAIN WO CONTRAST  Result Date: 07/27/2022 CLINICAL DATA:  Neuro deficit with acute stroke suspected. EXAM: MRI HEAD WITHOUT CONTRAST TECHNIQUE: Multiplanar, multiecho pulse sequences of the brain and surrounding structures were obtained without intravenous contrast. COMPARISON:  Head CT and CTA from earlier today. Left-sided facial numbness. Left arm numbness. Slight left facial droop. FINDINGS: Brain: No acute infarction, hemorrhage, hydrocephalus, extra-axial collection or mass lesion. Few small FLAIR hyperintensities in the cerebral white matter from nonspecific remote insult, likely chronic small vessel ischemia given the patient's vascular risk factors. Brain volume is normal. Vascular: Normal flow voids. Skull and upper cervical spine: Normal marrow signal. Sinuses/Orbits: Negative. IMPRESSION: No acute finding or explanation for symptoms. Electronically Signed   By: Tiburcio Pea M.D.   On: 07/27/2022 18:09   CT ANGIO HEAD NECK W WO CM (CODE STROKE)  Result Date: 07/27/2022 CLINICAL DATA:  Neuro  deficit, acute, stroke suspected. Left-sided facial numbness and left arm numbness. Slight left facial droop. EXAM: CT ANGIOGRAPHY HEAD AND NECK WITH AND WITHOUT CONTRAST TECHNIQUE: Multidetector CT imaging of the head and neck was performed using the standard protocol during bolus administration of intravenous contrast. Multiplanar CT image reconstructions and MIPs were obtained to evaluate the vascular anatomy. Carotid stenosis measurements (when applicable) are obtained utilizing NASCET criteria, using the distal internal carotid diameter as the denominator. RADIATION DOSE REDUCTION: This exam was performed according to the departmental dose-optimization program which includes automated exposure control, adjustment of the mA and/or kV according to patient size and/or use of iterative reconstruction technique. CONTRAST:  75mL OMNIPAQUE IOHEXOL 350 MG/ML SOLN COMPARISON:  Head CT 07/27/2022.  MRI brain 12/25/2016. FINDINGS: CTA NECK FINDINGS Aortic arch: Two-vessel arch configuration  with common origin of the right brachiocephalic and left common carotid arteries. Arch vessel origins are patent. Right carotid system: No evidence of dissection, stenosis (50% or greater), or occlusion. Left carotid system: No evidence of dissection, stenosis (50% or greater), or occlusion. Vertebral arteries: Codominant. No evidence of dissection, stenosis (50% or greater), or occlusion. Skeleton: Unremarkable. Other neck: Unremarkable. Upper chest: Unremarkable. Review of the MIP images confirms the above findings CTA HEAD FINDINGS Anterior circulation: Intracranial ICAs are patent without stenosis or aneurysm. The proximal ACAs and MCAs are patent without stenosis or aneurysm. Distal branches are symmetric. Posterior circulation: Normal basilar artery. The SCAs, AICAs and PICAs are patent proximally. The PCAs are patent proximally without stenosis or aneurysm. Distal branches are symmetric. Venous sinuses: Patent. Anatomic  variants: None. Review of the MIP images confirms the above findings IMPRESSION: Normal CTA of the head and neck. Electronically Signed   By: Orvan Falconer M.D.   On: 07/27/2022 16:15   CT HEAD CODE STROKE WO CONTRAST  Result Date: 07/27/2022 CLINICAL DATA:  Code stroke. Neuro deficit, acute, stroke suspected. Left-sided numbness. EXAM: CT HEAD WITHOUT CONTRAST TECHNIQUE: Contiguous axial images were obtained from the base of the skull through the vertex without intravenous contrast. RADIATION DOSE REDUCTION: This exam was performed according to the departmental dose-optimization program which includes automated exposure control, adjustment of the mA and/or kV according to patient size and/or use of iterative reconstruction technique. COMPARISON:  CT head without contrast 12/14/2020 FINDINGS: Brain: No acute infarct, hemorrhage, or mass lesion is present. No significant white matter lesions are present. Deep brain nuclei are within normal limits. Insular cortex is within normal limits bilaterally. The ventricles are of normal size. No significant extraaxial fluid collection is present. The brainstem and cerebellum are within normal limits. Midline structures are within normal limits. Vascular: No hyperdense vessel or unexpected calcification. Skull: Calvarium is intact. No focal lytic or blastic lesions are present. No significant extracranial soft tissue lesion is present. Sinuses/Orbits: Scattered mucosal thickening is present within anterior ethmoid air cells. Mild mucosal thickening present at the posterior left maxillary sinus. No fluid levels are present. The mastoid air cells are clear. The globes and orbits are within normal limits. ASPECTS [facility] Stroke Program Early CT Score) - Ganglionic level infarction (caudate, lentiform nuclei, internal capsule, insula, M1-M3 cortex): 7/7 - Supraganglionic infarction (M4-M6 cortex): 3/3 Total score (0-10 with 10 being normal): 10/10 IMPRESSION: 1. Negative CT  of the head. 2. Aspects is 10/10. These results were called by telephone at the time of interpretation on 07/27/2022 at 3:54 pm to provider [facility] , who verbally acknowledged these results. Electronically Signed   By: Marin Roberts M.D.   On: 07/27/2022 15:56    Procedures Procedures    Medications Ordered in ED Medications  iohexol (OMNIPAQUE) 350 MG/ML injection 75 mL (75 mLs Intravenous Contrast Given 07/27/22 1602)  prochlorperazine (COMPAZINE) injection 10 mg (10 mg Intravenous Given 07/27/22 1814)  diphenhydrAMINE (BENADRYL) injection 25 mg (25 mg Intravenous Given 07/27/22 1813)    ED Course/ Medical Decision Making/ A&P                             Medical Decision Making Amount and/or Complexity of Data Reviewed Labs: ordered. Radiology: ordered.  Risk Prescription drug management.    Final Clinical Impression(s) / ED Diagnoses Final diagnoses:  Other migraine without status migrainosus, not intractable  Paresthesia   This patient presents to the  ED with chief complaint(s) of left-sided facial droop, left-sided upper and lower extremity numbness, last known normal around 12 in the afternoon with pertinent past medical history of hypertension, hyperlipidemia.The complaint involves an extensive differential diagnosis and also carries with it a high risk of complications and morbidity.    The differential diagnosis includes : Acute ischemic stroke, TIA, severe electrolyte abnormality, brain bleed, complex migraine.  The initial plan is to get basic labs, CT scan of the brain.  We will activate code stroke as patient is within 4-1/2 hours of presentation.  He is LVO screen negative.  He is outside of TNK window.   Independent labs interpretation:  The following labs were independently interpreted: CBC, BMP are normal.  Independent visualization and interpretation of imaging: - I independently visualized the following imaging with scope of interpretation  limited to determining acute life threatening conditions related to emergency care: CT scan of the brain, which revealed no evidence of brain bleed  Treatment and Reassessment: Patient reassessed after the MRI was completed.  He indicates that he is headache free now.  He received migraine cocktail.  We discussed with him the findings on CT scan of the brain, MRI of the brain.  Negative for stroke.  CT angiogram did not reveal any concerning findings either.  We informed patient that he could be having complex migraine, that we recommend that he follows up with neurologist as an outpatient.  Low suspicion for TIA at this time.  Consultation: - Consulted or discussed management/test interpretation with external professional: Neurology service, they recommend that we proceed with MRI.  Rx / DC Orders ED Discharge Orders          Ordered    ibuprofen (ADVIL) 600 MG tablet  Every 8 hours PRN        07/27/22 1947              Derwood Kaplan, MD 07/27/22 2015

## 2022-07-27 NOTE — Consult Note (Signed)
[facility] Telemedicine Consult   Requesting Provider: Rhunette Croft, A Consult Participants: Patient, nursing Location of the provider: [facility] Location of the patient: [facility]  This consult was provided via telemedicine with 2-way video and audio communication. The patient/family was informed that care would be provided in this way and agreed to receive care in this manner.    Chief Complaint: Facial tingling  HPI: 52 yo M with a history of facial numbness and tingling who presents with his 4th episode of numbness/tingling of the left side of his face as well as blurred vision. He first presented in 2018 and was evaluated with CTA/MRI which was negative.  At the time, it was transient, and thought to be possibly TIA and he was discharged.  He has been evaluated for this two more times, though MRI was not obtained with either episode.  Today he presents with another episode and states that the symptoms are more intense than previously.    LKW: 11:45 am  tpa given?: No, mild symptoms IR Thrombectomy? No, no LVO Modified Rankin Scale: 0-Completely asymptomatic and back to baseline post- stroke Time of teleneurologist evaluation: 15:44  Exam: Vitals:   07/27/22 1525  BP: (!) 164/110  Pulse: 91  Resp: 18  Temp: 98.6 F (37 C)  SpO2: 99%    General: in bed, NAD  1A: Level of Consciousness - 0 1B: Ask Month and Age - 0 1C: 'Blink Eyes' & 'Squeeze Hands' - 0 2: Test Horizontal Extraocular Movements - 0 3: Test Visual Fields - 0 4: Test Facial Palsy - 0 5A: Test Left Arm Motor Drift - 0 5B: Test Right Arm Motor Drift - 0 6A: Test Left Leg Motor Drift - 0 6B: Test Right Leg Motor Drift - 0 7: Test Limb Ataxia - 0 8: Test Sensation - 1 9: Test Language/Aphasia- 0 10: Test Dysarthria - 0 11: Test Extinction/Inattention - 0 NIHSS score: 1  Imaging Reviewed: CT head/CTA-negative  Labs reviewed in epic and pertinent values follow: Creatinine  1.30   Assessment: 52 year old male who presents with left facial tingling and numbness and blurred vision in association with occipital headache with mild photophobia.  My strong suspicion at this point given the presence of positive symptoms is that this represents complicated migraine, however given his multiple stroke risk factors and fact that he has not been evaluated MRI since his initial episode, I do think repeating an MRI today would be prudent.  If MRI is negative, then I would treat as complicated migraine.  Recommendations: 1) MRI brain, stroke workup only if positive 2) if negative, would treat as complicated migraine   Ritta Slot, MD [facility] [phone]  If 7pm- 7am, please page neurology on call as listed in AMION.

## 2022-07-27 NOTE — Progress Notes (Signed)
Code Stroke cart activated . LKWT . Left-sided facial numbness with unilateral blurred vision. Denies any injury or trauma. Pt transported to CT .   mRS 0  Dr. Amada Jupiter paged   On camera  Pt returned from CT   No TNK. TSRN off camera . 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

## 2022-08-22 ENCOUNTER — Ambulatory Visit: Payer: Medicare Other | Admitting: Gastroenterology

## 2022-08-22 DIAGNOSIS — I776 Arteritis, unspecified: Secondary | ICD-10-CM | POA: Diagnosis not present

## 2022-08-22 DIAGNOSIS — G894 Chronic pain syndrome: Secondary | ICD-10-CM | POA: Diagnosis not present

## 2022-08-22 DIAGNOSIS — I10 Essential (primary) hypertension: Secondary | ICD-10-CM | POA: Diagnosis not present

## 2022-08-22 DIAGNOSIS — M545 Low back pain, unspecified: Secondary | ICD-10-CM | POA: Diagnosis not present

## 2022-08-22 DIAGNOSIS — Z79899 Other long term (current) drug therapy: Secondary | ICD-10-CM | POA: Diagnosis not present

## 2022-08-22 DIAGNOSIS — R030 Elevated blood-pressure reading, without diagnosis of hypertension: Secondary | ICD-10-CM | POA: Diagnosis not present

## 2022-08-23 NOTE — Progress Notes (Unsigned)
GI Office Note    Referring Provider: Benita Stabile, MD Primary Care Physician:  Benita Stabile, MD Primary Gastroenterologist: Hennie Duos. Marletta Lor, DO  Date:  08/24/2022  ID:  Shannon Wilson, DOB 05-18-1970, MRN [id number]   Chief Complaint   Chief Complaint  Patient presents with   Follow-up    Stopped taking mesalamine due to rash starting.   History of Present Illness  Shannon Wilson is a 52 y.o. male with a history of ulcerative proctosigmoiditis, GERD, HLD, HTN, MI, TIA, vasculitis presenting today for follow-up post colonoscopy.  Colonoscopy 02/11/2021 for screening: -Inadequate preparation of the colon, visualization precluded by stool in the ascending colon and cecum. -Nonbleeding internal hemorrhoids -Diffuse mild inflammation in the rectosigmoid colon secondary to proctosigmoid colitis s/p biopsy -Biopsies with mildly active chronic proctocolitis, negative for granulomas or dysplasia.  Biopsies with crypt architectural distortion, full-thickness and basal lymphoplasmacytosis with mild cryptitis suggestive of idiopathic IBD (differential includes SCAD, infection, drug effect) -Recommend repeat colonoscopy in 6 months due to poor bowel prep  Last office visit 05/24/22.  Sees blood in the stool when he eats spicy foods.  Usually has a bowel movement after every meal.  Had not been taking mesalamine for unknown period of time.  Has been avoiding NSAIDs.  Previously was seeing some rectal bleeding but none since stopping NSAIDs.  Occasional reflux symptoms triggered by anxious use and laying down prior to eating.  Had recent COVID infection. Scheduled for colonoscopy and advised to resume mesalamine enemas.  Colonoscopy 06/19/22: -non bleeding internal hemorrhoids -proctosigmoid ulcerative colitis with mild severity unchanged compared to prior s/p biopsy -rectal and sigmoid biopsy with chronic active colitis -advised to continue mesalamine enemas nightly and if issue with compliance will  switch to oral.    Today:  Recently stopped using Lialda due to reported rash. Took the medication for at least 3 weeks prior to seeing the rash. He had a rash on his arm and so he stopped tacking the medication. Last dose was last night. During the time that the rash started he was also handling fresh wood doing remolding project. The bumps are skin colored and by morning time most of them disappear. Has carpets and sofas cleaned given this reaction. No changes to detergents or deodorants. Even stopped deodorant for a week and symptoms continued.   Every night between 6 and 6:30 he develops a rash on both legs and arms. Nothing on his chest or face. It is itchy but not painful.   At time admits to not taking medications due to fear of interactions.   Having bowel movements after every meal usually. Only has a little bit of blood in his stool with spicy foods or hot foods otherwise does not have any stools. Still avoiding NSAIDs.   Has good appetite.   Gabapentin works well for pain with limited mobility.   Last flu shot: October 2023 Last pneumonia shot: none. Recommend PSV23 Last zoster vaccine: None. COVID-19 shot: No prior vaccine, recent illness in January 2024.  Current Outpatient Medications  Medication Sig Dispense Refill   amLODipine (NORVASC) 10 MG tablet Take 1 tablet (10 mg total) by mouth daily. 30 tablet 0   aspirin EC 81 MG tablet Take 1 tablet (81 mg total) by mouth 2 (two) times a week.     baclofen (LIORESAL) 10 MG tablet Take 10 mg by mouth 3 (three) times daily as needed for muscle spasms.     gabapentin (NEURONTIN) 300 MG capsule Take 300  mg by mouth 3 (three) times daily as needed (nerve pain).     ibuprofen (ADVIL) 600 MG tablet Take 1 tablet (600 mg total) by mouth every 8 (eight) hours as needed. 15 tablet 0   lisinopril (ZESTRIL) 40 MG tablet Take 40 mg by mouth daily.     rosuvastatin (CRESTOR) 20 MG tablet Take 20 mg by mouth at bedtime.     mesalamine  (LIALDA) 1.2 g EC tablet Take 4 tablets (4.8 g total) by mouth daily with breakfast. Take 4 tablets daily for 2 months then decrease to 2 tablets daily. (Patient not taking: Reported on 08/24/2022) 120 tablet 11   No current facility-administered medications for this visit.    Past Medical History:  Diagnosis Date   Back pain    GERD (gastroesophageal reflux disease)    Hyperlipidemia    Hypertension    Kidney stone    Lumbosacral radiculopathy at S1 09/04/2017   Migraine    Myocardial infarction [facility])    Stroke [facility])    TIA (transient ischemic attack)    Vasculitis (HCC)    celiac artery vasculitis    Past Surgical History:  Procedure Laterality Date   BACK SURGERY  08/2012   BIOPSY  02/11/2021   Procedure: BIOPSY;  Surgeon: Lanelle Bal, DO;  Location: [facility];  Service: Endoscopy;;   BIOPSY  06/19/2022   Procedure: BIOPSY;  Surgeon: Lanelle Bal, DO;  Location: [facility];  Service: Endoscopy;;   COLONOSCOPY WITH PROPOFOL N/A 02/11/2021   Procedure: COLONOSCOPY WITH PROPOFOL;  Surgeon: Lanelle Bal, DO;  Location: [facility];  Service: Endoscopy;  Laterality: N/A;  9:15 / ASA III   COLONOSCOPY WITH PROPOFOL N/A 06/19/2022   Procedure: COLONOSCOPY WITH PROPOFOL;  Surgeon: Lanelle Bal, DO;  Location: [facility];  Service: Endoscopy;  Laterality: N/A;  8:30 am   asa 3   INGUINAL HERNIA REPAIR Right 10/18/2020   Procedure: HERNIA REPAIR INGUINAL ADULT W/MESH;  Surgeon: Lucretia Roers, MD;  Location: [facility];  Service: General;  Laterality: Right;  pt knows to arrive at 6:15   SPINE SURGERY  08/2012   discectomy    Family History  Problem Relation Age of Onset   Hypertension Mother    Heart disease Mother 29   Cancer Father        prostate   Sickle cell trait Father    Sickle cell trait Son    Cancer Maternal Grandmother        bone   Cancer Paternal Grandmother    Cancer Paternal Grandfather        liver   Learning disabilities Neg Hx      Allergies as of 08/24/2022 - Review Complete 08/24/2022  Allergen Reaction Noted   Hydrocodone-acetaminophen Nausea And Vomiting 07/03/2020   Triamcinolone Rash 07/27/2022    Social History   Socioeconomic History   Marital status: Married    Spouse name: Not on file   Number of children: 4   Years of education: 12   Highest education level: Not on file  Occupational History   Occupation: disabled    Comment: heating and air  Tobacco Use   Smoking status: Never   Smokeless tobacco: Never  Vaping Use   Vaping Use: Never used  Substance and Sexual Activity   Alcohol use: No   Drug use: No   Sexual activity: Not Currently    Birth control/protection: None  Other Topics Concern   Not  on file  Social History Narrative   Living with mother   [place] to [place] to be near family   Social Determinants of Health   Financial Resource Strain: Not on file  Food Insecurity: Not on file  Transportation Needs: Not on file  Physical Activity: Not on file  Stress: Not on file  Social Connections: Not on file     Review of Systems   Gen: + back pain. Denies fever, chills, anorexia. Denies fatigue, weakness, weight loss.  CV: Denies chest pain, palpitations, syncope, peripheral edema, and claudication. Resp: Denies dyspnea at rest, cough, wheezing, coughing up blood, and pleurisy. GI: See HPI Derm: Denies rash, itching, dry skin Psych: Denies depression, anxiety, memory loss, confusion. No homicidal or suicidal ideation.  Heme: Denies bruising, bleeding, and enlarged lymph nodes.  Physical Exam   BP (!) 159/115 (BP Location: Right Arm, Patient Position: Sitting, Cuff Size: Large)   Pulse 87   Temp 98.2 F (36.8 C) (Oral)   Ht 5\' 11"  (1.803 m)   Wt 192 lb 9.6 oz (87.4 kg)   SpO2 97%   BMI 26.86 kg/m   General:   Alert and oriented. No distress noted. Pleasant and cooperative.  Head:  Normocephalic and atraumatic. Eyes:  Conjuctiva clear without scleral  icterus. Mouth:  Oral mucosa pink and moist. Good dentition. No lesions. Lungs:  Clear to auscultation bilaterally. No wheezes, rales, or rhonchi. No distress.  Heart:  S1, S2 present without murmurs appreciated.  Abdomen:  +BS, soft, non-tender and non-distended. No rebound or guarding. No HSM or masses noted. Rectal: deferred Msk:  Symmetrical without gross deformities. Normal posture. Extremities:  Without edema. Neurologic:  Alert and oriented x4 Psych:  Alert and cooperative. Normal mood and affect.  Assessment  Shannon Wilson is a 52 y.o. male with a history of ulcerative proctosigmoiditis, GERD, HTN, HLD, MI, vasculitis, and TIA presenting today for follow up.   Ulcerative proctosigmoid colitis: Continues to only experience rectal bleeding spicy/hot foods.  Colonoscopy 06/19/2022 with nonbleeding internal hemorrhoids and active chronic proctosigmoid ulcerative colitis essentially unchanged from prior colonoscopy in November 2022.  Continues to have a bowel movement after every meal but denies any abdominal pain, nausea, vomiting, lack of appetite, or weight loss.  Started on Lialda 4.8 g daily but only took for 3 weeks given concern for a rash on his arms.  The rash appears to come at the same time every evening and gets better by morning time mostly therefore it is unlikely that the Lialda has caused that given it continues to happen despite being off the medication for many weeks now.  For now we will resume Lialda 4.8 g daily for 8 weeks and then reduce to 2.4 mg daily thereafter and monitor for any worsening rash or other concerning signs/symptoms.  If there is worsening we will stop and consider alternative therapy for treatment of UC and consider dermatology referral.  He is up-to-date on flu vaccine.  Has not had COVID-vaccine given he recently had COVID infection a few months ago.  Recommended pneumonia vaccination and advised him to reach out to his primary care to complete this.  PLAN    Resume Lialda 4.8 g daily for 8 weeks then reduce to 2.4g daily.  Follow up colonoscopy in 6-12 months Notify if rash worsens and will consider dermatology referral if it continues and switch treatment for UC.  PSV23 vaccination recommended.  Follow up in 4 months.   Brooke Bonito, MSN, FNP-BC, AGACNP-BC [facility]

## 2022-08-24 ENCOUNTER — Ambulatory Visit: Payer: Medicare Other | Admitting: Gastroenterology

## 2022-08-24 ENCOUNTER — Encounter: Payer: Self-pay | Admitting: Gastroenterology

## 2022-08-24 VITALS — BP 159/115 | HR 87 | Temp 98.2°F | Ht 71.0 in | Wt 192.6 lb

## 2022-08-24 DIAGNOSIS — K5130 Ulcerative (chronic) rectosigmoiditis without complications: Secondary | ICD-10-CM | POA: Diagnosis not present

## 2022-08-24 NOTE — Patient Instructions (Addendum)
Resume Lialda 4.8 g daily.  He will take 4.8 g daily for the next 8 weeks and then reduce to 2.4 g daily thereafter.  You may take this in the morning or in the evening if you have fear of taking it with your other medications.  As we discussed there are no known drug interactions between Lialda or any other medications.  I would recommend that you get the pneumonia vaccine (PPSV23-Pneumovax).  You may reach out to your primary care to see if they have this available for you.  If they are not able to administer it I am happy to give you a prescription for you to have this done at your local pharmacy.  Please monitor for any worsening of your rash over the next 2 weeks after resuming your Lialda.  If the rash worsens we will consider changing your therapy and refer you to dermatology.  It was a pleasure to see you today. I want to create trusting relationships with patients. If you receive a survey regarding your visit,  I greatly appreciate you taking time to fill this out on paper or through your MyChart. I value your feedback.  Brooke Bonito, MSN, FNP-BC, AGACNP-BC [facility]

## 2022-09-20 DIAGNOSIS — M545 Low back pain, unspecified: Secondary | ICD-10-CM | POA: Diagnosis not present

## 2022-09-20 DIAGNOSIS — R030 Elevated blood-pressure reading, without diagnosis of hypertension: Secondary | ICD-10-CM | POA: Diagnosis not present

## 2022-09-20 DIAGNOSIS — I10 Essential (primary) hypertension: Secondary | ICD-10-CM | POA: Diagnosis not present

## 2022-09-20 DIAGNOSIS — Z79899 Other long term (current) drug therapy: Secondary | ICD-10-CM | POA: Diagnosis not present

## 2022-09-20 DIAGNOSIS — G894 Chronic pain syndrome: Secondary | ICD-10-CM | POA: Diagnosis not present

## 2022-09-20 DIAGNOSIS — I776 Arteritis, unspecified: Secondary | ICD-10-CM | POA: Diagnosis not present

## 2022-10-03 ENCOUNTER — Other Ambulatory Visit: Payer: Self-pay

## 2022-10-03 ENCOUNTER — Emergency Department (HOSPITAL_COMMUNITY)
Admission: EM | Admit: 2022-10-03 | Discharge: 2022-10-04 | Disposition: A | Discharge status: Home / Self Care | Payer: Medicare Other | Attending: Emergency Medicine | Admitting: Emergency Medicine

## 2022-10-03 ENCOUNTER — Encounter (HOSPITAL_COMMUNITY): Payer: Self-pay

## 2022-10-03 DIAGNOSIS — M25571 Pain in right ankle and joints of right foot: Secondary | ICD-10-CM | POA: Insufficient documentation

## 2022-10-03 DIAGNOSIS — Z7982 Long term (current) use of aspirin: Secondary | ICD-10-CM | POA: Diagnosis not present

## 2022-10-03 DIAGNOSIS — M7661 Achilles tendinitis, right leg: Secondary | ICD-10-CM | POA: Diagnosis not present

## 2022-10-03 DIAGNOSIS — M7989 Other specified soft tissue disorders: Secondary | ICD-10-CM | POA: Diagnosis not present

## 2022-10-03 DIAGNOSIS — M7731 Calcaneal spur, right foot: Secondary | ICD-10-CM | POA: Diagnosis not present

## 2022-10-03 NOTE — ED Triage Notes (Signed)
C/o right ankle swelling and right foot numbness.  Pt denies fall/trauma.  Right pedal pulse intact.

## 2022-10-04 ENCOUNTER — Emergency Department (HOSPITAL_COMMUNITY): Payer: Medicare Other

## 2022-10-04 DIAGNOSIS — M7731 Calcaneal spur, right foot: Secondary | ICD-10-CM | POA: Diagnosis not present

## 2022-10-04 DIAGNOSIS — S99811A Other specified injuries of right ankle, initial encounter: Secondary | ICD-10-CM | POA: Diagnosis not present

## 2022-10-04 DIAGNOSIS — M7661 Achilles tendinitis, right leg: Secondary | ICD-10-CM | POA: Diagnosis not present

## 2022-10-04 DIAGNOSIS — M7989 Other specified soft tissue disorders: Secondary | ICD-10-CM | POA: Diagnosis not present

## 2022-10-04 DIAGNOSIS — M25571 Pain in right ankle and joints of right foot: Secondary | ICD-10-CM | POA: Diagnosis not present

## 2022-10-04 MED ORDER — OXYCODONE-ACETAMINOPHEN 5-325 MG PO TABS
1.0000 | ORAL_TABLET | Freq: Once | ORAL | Status: AC
  Administered 2022-10-04: 1 via ORAL
  Filled 2022-10-04: qty 1

## 2022-10-04 NOTE — ED Provider Notes (Signed)
[place] EMERGENCY DEPARTMENT AT [facility]  Provider Note  CSN: [id number] Arrival date & time: 10/03/22 2344  History Chief Complaint  Patient presents with   Foot Pain    Arhan Mcmanamon is a 52 y.o. male with prior history of sciatica affecting his L side reports after getting out of the shower this evening he noticed some swelling to his R ankle and pain with standing that is unusual for him. He has not had any falls or injuries but he reports he was on his feet a lot today. He denies any history of similar such as arthritis or gout. No fevers or chills.    Home Medications Prior to Admission medications   Medication Sig Start Date End Date Taking? Authorizing Provider  amLODipine (NORVASC) 10 MG tablet Take 1 tablet (10 mg total) by mouth daily. 11/03/18   Terrilee Files, MD  aspirin EC 81 MG tablet Take 1 tablet (81 mg total) by mouth 2 (two) times a week. 10/18/20   Lucretia Roers, MD  baclofen (LIORESAL) 10 MG tablet Take 10 mg by mouth 3 (three) times daily as needed for muscle spasms.    [provider]  gabapentin (NEURONTIN) 300 MG capsule Take 300 mg by mouth 3 (three) times daily as needed (nerve pain).    [provider]  ibuprofen (ADVIL) 600 MG tablet Take 1 tablet (600 mg total) by mouth every 8 (eight) hours as needed. 07/27/22   Derwood Kaplan, MD  lisinopril (ZESTRIL) 40 MG tablet Take 40 mg by mouth daily.    [provider]  mesalamine (LIALDA) 1.2 g EC tablet Take 4 tablets (4.8 g total) by mouth daily with breakfast. Take 4 tablets daily for 2 months then decrease to 2 tablets daily. Patient not taking: Reported on 08/24/2022 06/19/22 06/19/23  Lanelle Bal, DO  rosuvastatin (CRESTOR) 20 MG tablet Take 20 mg by mouth at bedtime. 07/21/20   [provider]     Allergies    Hydrocodone-acetaminophen and Triamcinolone   Review of Systems   Review of Systems Please see HPI for pertinent positives and  negatives  Physical Exam BP (!) 160/104   Pulse 71   Temp 98 F (36.7 C)   Resp 20   Wt 87 kg   SpO2 95%   BMI 26.75 kg/m   Physical Exam Vitals and nursing note reviewed.  HENT:     Head: Normocephalic.     Nose: Nose normal.  Eyes:     Extraocular Movements: Extraocular movements intact.  Cardiovascular:     Pulses: Normal pulses.  Pulmonary:     Effort: Pulmonary effort is normal.  Musculoskeletal:        General: Swelling (mild soft tissue swelling over the R medial malleolus) and tenderness (R medial ankle) present. Normal range of motion.     Cervical back: Neck supple.     Comments: No erythema or warmth to suggest infection or inflammatory arthritis such as gout  Skin:    Findings: No rash (on exposed skin).  Neurological:     Mental Status: He is alert and oriented to person, place, and time.  Psychiatric:        Mood and Affect: Mood normal.     ED Results / Procedures / Treatments   EKG None  Procedures Procedures  Medications Ordered in the ED Medications  oxyCODONE-acetaminophen (PERCOCET/ROXICET) 5-325 MG per tablet 1 tablet (has no administration in time range)    Initial  Impression and Plan  Patient here with R ankle pain/swelling. Atraumatic but could be from overuse given he reports he was on his feet a lot today. He has a benign exam. I personally viewed the images from radiology studies and agree with radiologist interpretation: Xray is neg for bony process. Plan ASO/crutches for comfort. Recommend he rest, ice and elevate the ankle for the next couple of days. Recommend Ortho follow up if not improving. RTED for any worsening including any signs of infection.   ED Course       MDM Rules/Calculators/A&P Medical Decision Making Problems Addressed: Acute right ankle pain: acute illness or injury  Amount and/or Complexity of Data Reviewed Radiology: ordered and independent interpretation performed. Decision-making details documented in  ED Course.  Risk Prescription drug management.     Final Clinical Impression(s) / ED Diagnoses Final diagnoses:  Acute right ankle pain    Rx / DC Orders ED Discharge Orders     None        Pollyann Savoy, MD 10/04/22 [phone]

## 2022-10-11 ENCOUNTER — Telehealth: Payer: Self-pay

## 2022-10-11 NOTE — Telephone Encounter (Signed)
Transition Care Management Follow-up Telephone Call Date of discharge and from where: 10/04/2022 [facility] How have you been since you were released from the hospital? Patient is feeling better, but his ankle is still swollen. Any questions or concerns? No  Items Reviewed: Did the pt receive and understand the discharge instructions provided? Yes  Medications obtained and verified? Yes  Other? No  Any new allergies since your discharge? No  Dietary orders reviewed? Yes Do you have support at home? Yes   Follow up appointments reviewed:  PCP Hospital f/u appt confirmed? No  Scheduled to see  on  @ . Specialist Hospital f/u appt confirmed?  Patient stated that he has an appointment with an Orthopedic Surgeon.  Scheduled to see  on  @ . Are transportation arrangements needed? No  If their condition worsens, is the pt aware to call PCP or go to the Emergency Dept.? Yes Was the patient provided with contact information for the PCP's office or ED? Yes Was to pt encouraged to call back with questions or concerns? Yes  Ahmed Inniss [facility]  [facility] Population Health Community Resource Care Guide   [email or web address]  ?? [phone]   Website: [email or web address]  [email or web address]

## 2022-11-23 ENCOUNTER — Encounter: Payer: Self-pay | Admitting: Internal Medicine

## 2022-11-28 IMAGING — CT CT HEAD W/O CM
3 series · 15 of 47 positions shown, 18 images · non-contrast
Comparison: CT head 11/03/2018.

CLINICAL DATA: Left-sided facial numbness and headache.

EXAM:
CT HEAD WITHOUT CONTRAST
TECHNIQUE: Contiguous axial images were obtained from the base of the skull
through the vertex without intravenous contrast.

[Series 2: head w o · axial · 0.42mm/px · z∈[+65,+200]mm · 9 of 33 slices shown, 12 images]
[im 3/33  brain]
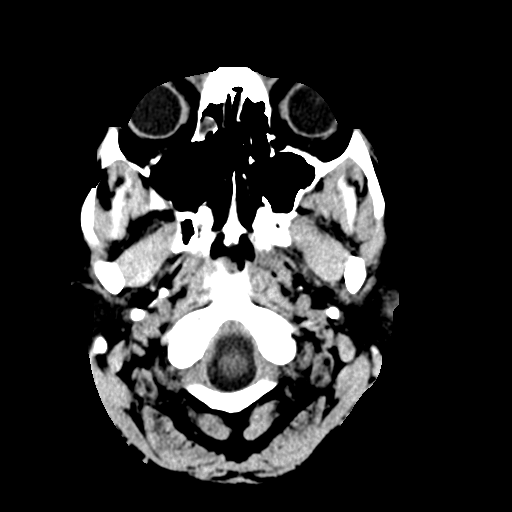
[im 3/33  bone]
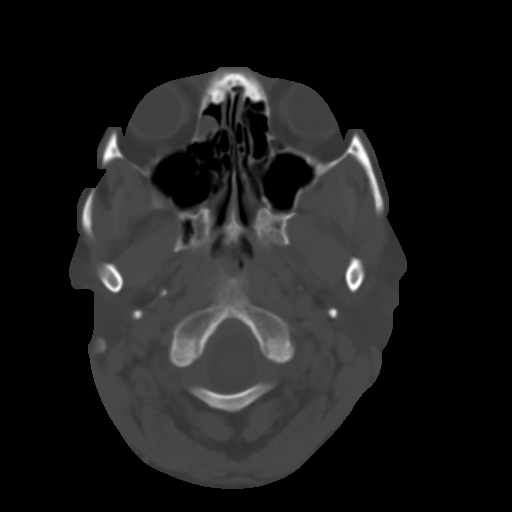
[im 6/33  brain]
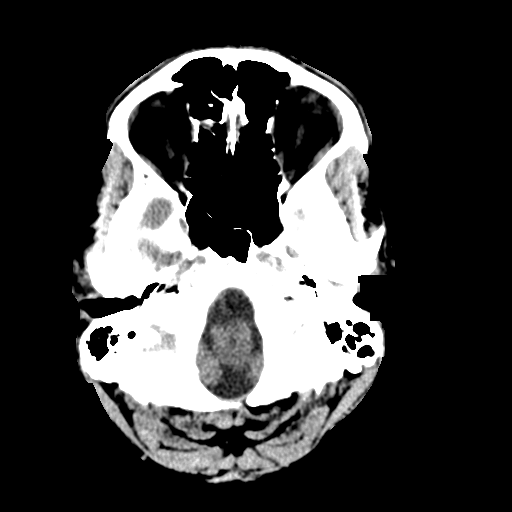
[im 9/33  brain]
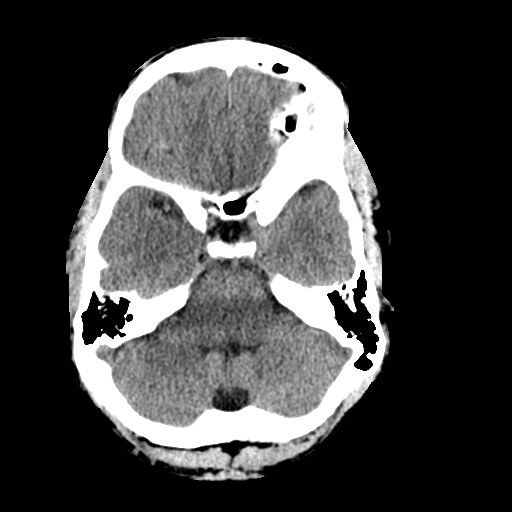
[im 13/33  brain]
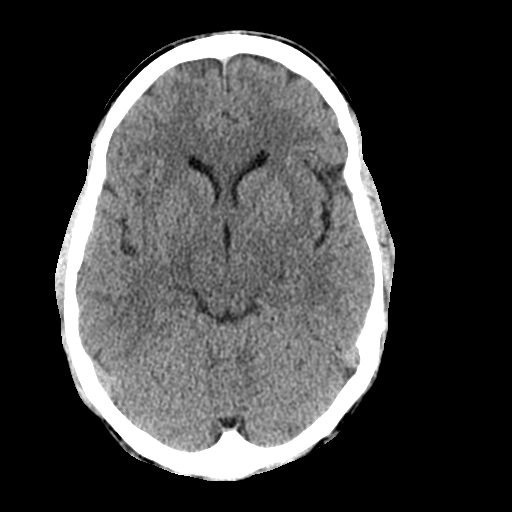
[im 17/33  brain]
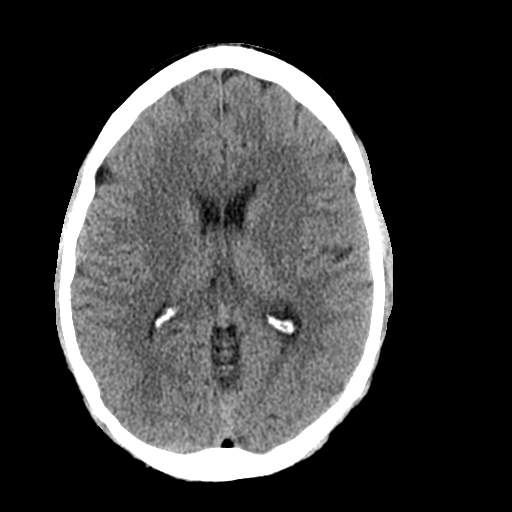
[im 17/33  bone]
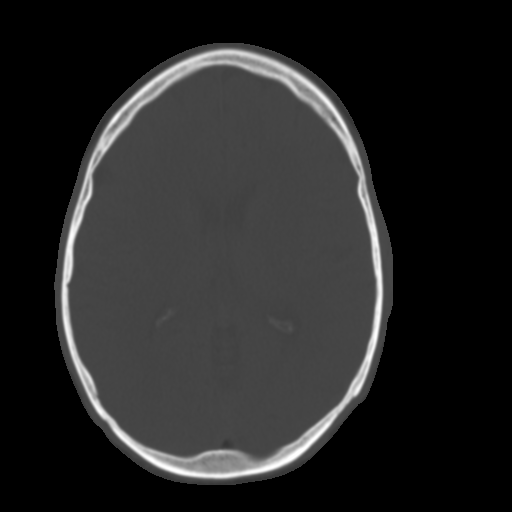
[im 20/33  brain]
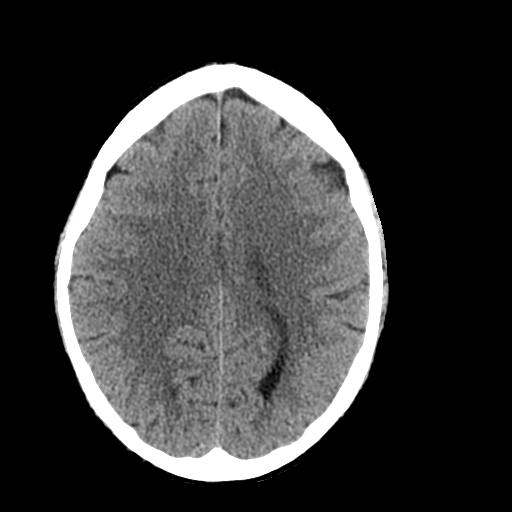
[im 24/33  brain]
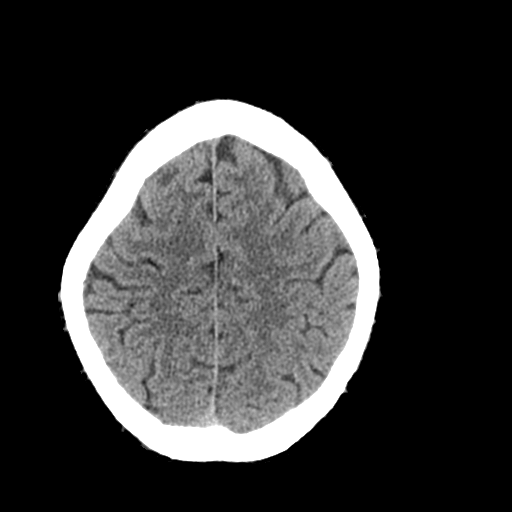
[im 27/33  brain]
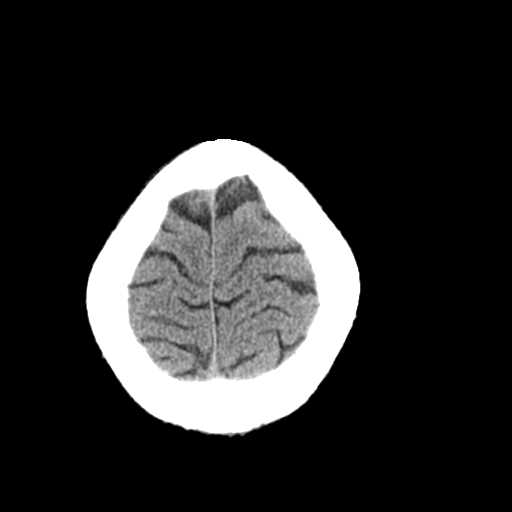
[im 30/33  brain]
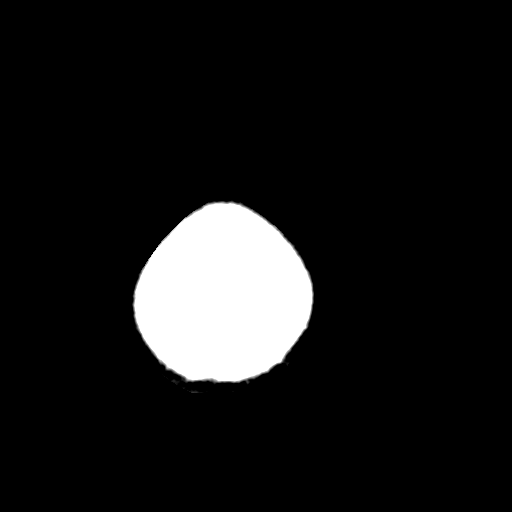
[im 30/33  bone]
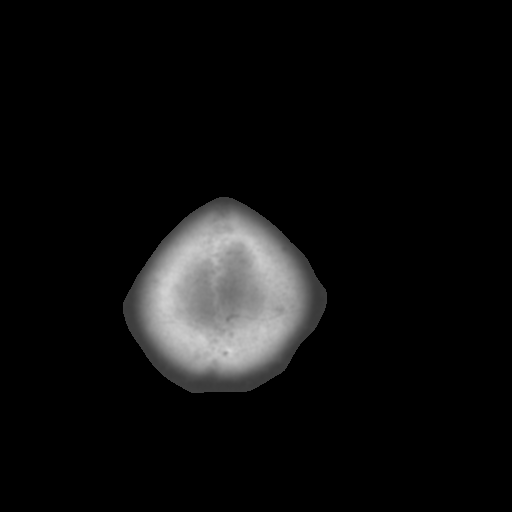

[Series 4: coronal soft · coronal · 0.31mm/px · 3 of 67 slices shown]
[im 23/67  brain]
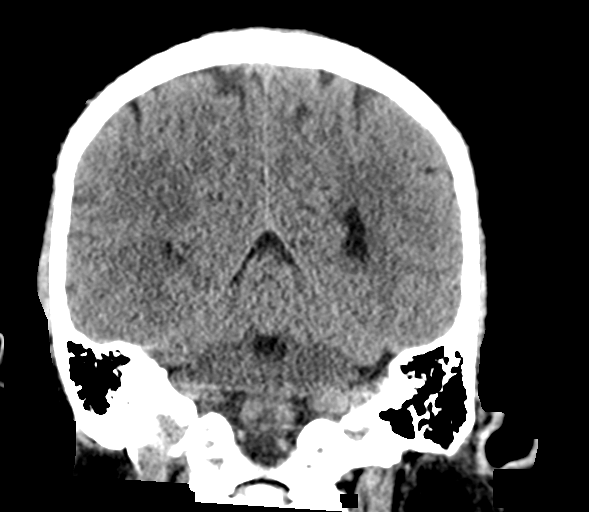
[im 30/67  brain]
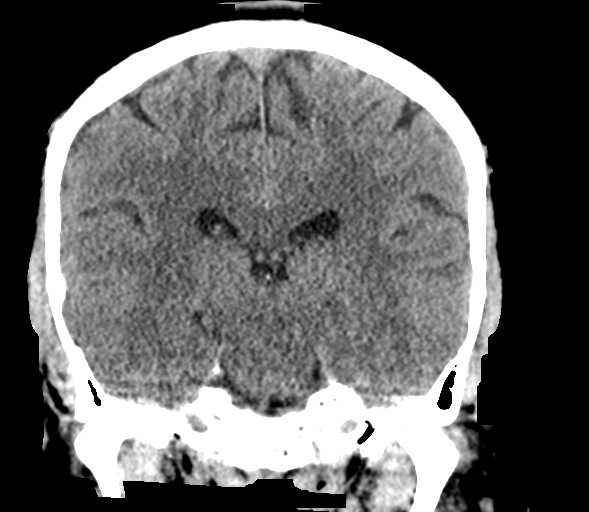
[im 37/67  brain]
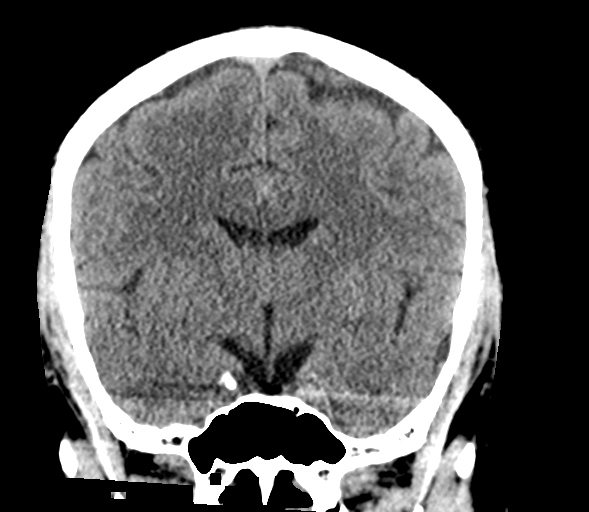

[Series 5: sagittal soft · sagittal · 0.34mm/px · 3 of 58 slices shown]
[im 20/58  brain]
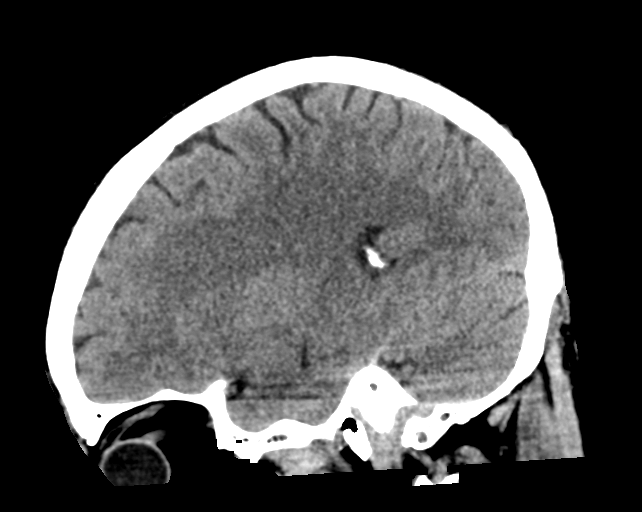
[im 29/58  brain]
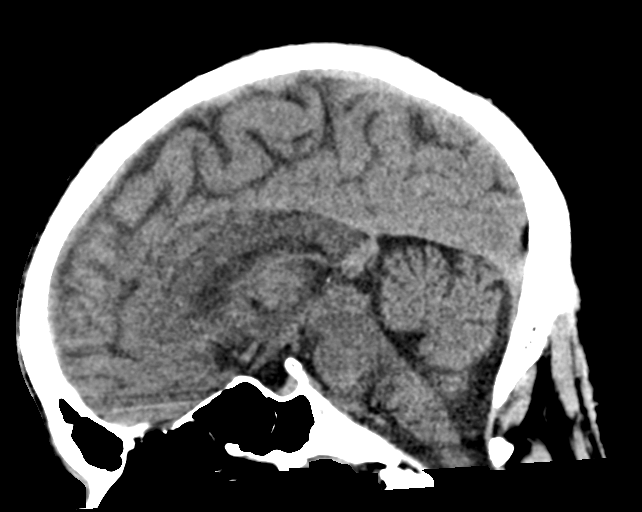
[im 39/58  brain]
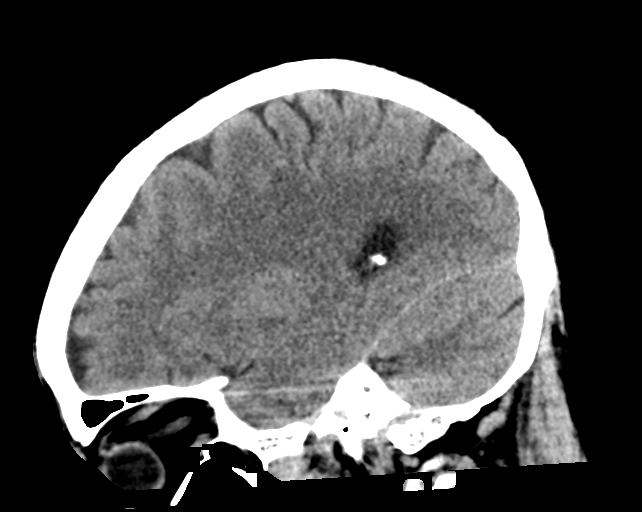

[15 of 47 positions shown; findings below may reference images not displayed]

FINDINGS: Brain: No evidence of acute infarction, hemorrhage, hydrocephalus,
extra-axial collection or mass lesion/mass effect.

Vascular: No hyperdense vessel or unexpected calcification.

Skull: Normal. Negative for fracture or focal lesion.

Sinuses/Orbits: There stable polyps or mucous retention cysts in the
left frontal sinus and right ethmoid air cell measuring up to 13 mm.
The visualized paranasal sinuses and mastoid air cells are otherwise
clear.

Other: None.
IMPRESSION: No acute intracranial process.

## 2022-12-12 DIAGNOSIS — G894 Chronic pain syndrome: Secondary | ICD-10-CM | POA: Diagnosis not present

## 2022-12-12 DIAGNOSIS — Z79899 Other long term (current) drug therapy: Secondary | ICD-10-CM | POA: Diagnosis not present

## 2022-12-12 DIAGNOSIS — M129 Arthropathy, unspecified: Secondary | ICD-10-CM | POA: Diagnosis not present

## 2022-12-12 DIAGNOSIS — E7800 Pure hypercholesterolemia, unspecified: Secondary | ICD-10-CM | POA: Diagnosis not present

## 2022-12-12 DIAGNOSIS — Z0000 Encounter for general adult medical examination without abnormal findings: Secondary | ICD-10-CM | POA: Diagnosis not present

## 2022-12-12 DIAGNOSIS — R5383 Other fatigue: Secondary | ICD-10-CM | POA: Diagnosis not present

## 2022-12-12 DIAGNOSIS — R030 Elevated blood-pressure reading, without diagnosis of hypertension: Secondary | ICD-10-CM | POA: Diagnosis not present

## 2022-12-12 DIAGNOSIS — I10 Essential (primary) hypertension: Secondary | ICD-10-CM | POA: Diagnosis not present

## 2022-12-12 DIAGNOSIS — M545 Low back pain, unspecified: Secondary | ICD-10-CM | POA: Diagnosis not present

## 2022-12-12 DIAGNOSIS — Z131 Encounter for screening for diabetes mellitus: Secondary | ICD-10-CM | POA: Diagnosis not present

## 2022-12-26 DIAGNOSIS — R7303 Prediabetes: Secondary | ICD-10-CM | POA: Diagnosis not present

## 2022-12-26 DIAGNOSIS — E785 Hyperlipidemia, unspecified: Secondary | ICD-10-CM | POA: Diagnosis not present

## 2023-01-01 DIAGNOSIS — I10 Essential (primary) hypertension: Secondary | ICD-10-CM | POA: Diagnosis not present

## 2023-01-01 DIAGNOSIS — K4090 Unilateral inguinal hernia, without obstruction or gangrene, not specified as recurrent: Secondary | ICD-10-CM | POA: Diagnosis not present

## 2023-01-01 DIAGNOSIS — K219 Gastro-esophageal reflux disease without esophagitis: Secondary | ICD-10-CM | POA: Diagnosis not present

## 2023-01-01 DIAGNOSIS — E785 Hyperlipidemia, unspecified: Secondary | ICD-10-CM | POA: Diagnosis not present

## 2023-01-01 DIAGNOSIS — R809 Proteinuria, unspecified: Secondary | ICD-10-CM | POA: Diagnosis not present

## 2023-01-01 DIAGNOSIS — R7303 Prediabetes: Secondary | ICD-10-CM | POA: Diagnosis not present

## 2023-01-01 DIAGNOSIS — M546 Pain in thoracic spine: Secondary | ICD-10-CM | POA: Diagnosis not present

## 2023-01-01 DIAGNOSIS — K5130 Ulcerative (chronic) rectosigmoiditis without complications: Secondary | ICD-10-CM | POA: Diagnosis not present

## 2023-01-01 DIAGNOSIS — Z23 Encounter for immunization: Secondary | ICD-10-CM | POA: Diagnosis not present

## 2023-01-01 DIAGNOSIS — E870 Hyperosmolality and hypernatremia: Secondary | ICD-10-CM | POA: Diagnosis not present

## 2023-01-01 DIAGNOSIS — K122 Cellulitis and abscess of mouth: Secondary | ICD-10-CM | POA: Diagnosis not present

## 2023-01-15 NOTE — Progress Notes (Unsigned)
GI Office Note    Referring Provider: Benita Stabile, MD Primary Care Physician:  Benita Stabile, MD Primary Gastroenterologist: Hennie Duos. Marletta Lor, DO  Date:  01/16/2023  ID:  Shannon Wilson, DOB 1970/09/28, MRN [id number]   Chief Complaint   Chief Complaint  Patient presents with   Follow-up    Follow up. No problems    History of Present Illness  Shannon Wilson is a 52 y.o. male with a history of ulcerative proctosigmoiditis, HLD, HTN, GERD, MI, TIA, vasculitis presenting today for follow-up of UC.   Colonoscopy 02/11/2021 for screening: -Inadequate preparation of the colon, visualization precluded by stool in the ascending colon and cecum. -Nonbleeding internal hemorrhoids -Diffuse mild inflammation in the rectosigmoid colon secondary to proctosigmoid colitis s/p biopsy -Biopsies with mildly active chronic proctocolitis, negative for granulomas or dysplasia.  Biopsies with crypt architectural distortion, full-thickness and basal lymphoplasmacytosis with mild cryptitis suggestive of idiopathic IBD (differential includes SCAD, infection, drug effect) -Recommend repeat colonoscopy in 6 months due to poor bowel prep   Office visit 05/24/22.  Sees blood in the stool when he eats spicy foods.  Usually has a bowel movement after every meal.  Had not been taking mesalamine for unknown period of time.  Has been avoiding NSAIDs.  Previously was seeing some rectal bleeding but none since stopping NSAIDs.  Occasional reflux symptoms triggered by anxious use and laying down prior to eating.  Had recent COVID infection. Scheduled for colonoscopy and advised to resume mesalamine enemas.   Colonoscopy 06/19/22: -non bleeding internal hemorrhoids -proctosigmoid ulcerative colitis with mild severity unchanged compared to prior s/p biopsy -rectal and sigmoid biopsy with chronic active colitis -advised to continue mesalamine enemas nightly and if issue with compliance will switch to oral.   Last office visit  08/24/22. Had recently stopped Lialda due to reported rash.  Taking it for 3 weeks prior to seeing it.  Was also healing fresh wood and doing remodeling project and was unsure if his skin changes occurred because of that with the medication.  Rash appears between 6 and 6:30 in the evening on the arms and legs but nothing on chest or face.  Admits to at times not taking medication as prescribed.  Good appetite.  Bowel movement after every meal, minimal blood in stool with spicy foods otherwise does not have any.  Avoiding NSAIDs.  Advised to try resume Lialda 4.8 g daily for 8 weeks and then reduce to 2.4 g daily.  Plan for colonoscopy 6/12 months after remission.  Advised to notify the office if rash worsens and we can consider dermatology referral.  Recommended pneumonia vaccination.  Labs 9/17: CBC and CMP normal. LDL elevated. A1c 5.7  Today:  Last flu shot: September 2024 Last pneumonia shot: not gotten, needs PSV23 Last evaluation by dermatology: never seen.  Last zoster vaccine: None COVID-19 shot: No vaccination,  active infection Jan 2024  Sees a change in his stool with certain foods. Other day he had some spicy bbq buffalo wings and seen a difference. May see some red tinge to his stool but no overt blood. Does have mucous when this occurs.  States the rashes coincidentally coincided. His wife had changed detergent and that was causing the rashes. No issues with taking the medication. No abdominal pain. No weight loss. Appetite is good. No chest pain, shortness of breath.  Denies any melena, nausea, vomiting, lack of appetite, early satiety.   Current Outpatient Medications  Medication Sig Dispense Refill   amLODipine (  NORVASC) 10 MG tablet Take 1 tablet (10 mg total) by mouth daily. 30 tablet 0   aspirin EC 81 MG tablet Take 1 tablet (81 mg total) by mouth 2 (two) times a week.     baclofen (LIORESAL) 10 MG tablet Take 10 mg by mouth 3 (three) times daily as needed for muscle spasms.      gabapentin (NEURONTIN) 300 MG capsule Take 300 mg by mouth 3 (three) times daily as needed (nerve pain).     ibuprofen (ADVIL) 600 MG tablet Take 1 tablet (600 mg total) by mouth every 8 (eight) hours as needed. 15 tablet 0   lisinopril (ZESTRIL) 40 MG tablet Take 40 mg by mouth daily.     meloxicam (MOBIC) 15 MG tablet TAKE 1 TABLET BY MOUTH ONCE DAILY *REFILL REQUEST*     rosuvastatin (CRESTOR) 20 MG tablet Take 20 mg by mouth at bedtime.     mesalamine (LIALDA) 1.2 g EC tablet Take 4 tablets (4.8 g total) by mouth daily with breakfast. Take 4 tablets daily for 2 months then decrease to 2 tablets daily. (Patient not taking: Reported on 08/24/2022) 120 tablet 11   No current facility-administered medications for this visit.    Past Medical History:  Diagnosis Date   Back pain    GERD (gastroesophageal reflux disease)    Hyperlipidemia    Hypertension    Kidney stone    Lumbosacral radiculopathy at S1 09/04/2017   Migraine    Myocardial infarction [facility])    Stroke [facility])    TIA (transient ischemic attack)    Vasculitis (HCC)    celiac artery vasculitis    Past Surgical History:  Procedure Laterality Date   BACK SURGERY  08/2012   BIOPSY  02/11/2021   Procedure: BIOPSY;  Surgeon: Lanelle Bal, DO;  Location: [facility];  Service: Endoscopy;;   BIOPSY  06/19/2022   Procedure: BIOPSY;  Surgeon: Lanelle Bal, DO;  Location: [facility];  Service: Endoscopy;;   COLONOSCOPY WITH PROPOFOL N/A 02/11/2021   Procedure: COLONOSCOPY WITH PROPOFOL;  Surgeon: Lanelle Bal, DO;  Location: [facility];  Service: Endoscopy;  Laterality: N/A;  9:15 / ASA III   COLONOSCOPY WITH PROPOFOL N/A 06/19/2022   Procedure: COLONOSCOPY WITH PROPOFOL;  Surgeon: Lanelle Bal, DO;  Location: [facility];  Service: Endoscopy;  Laterality: N/A;  8:30 am   asa 3   INGUINAL HERNIA REPAIR Right 10/18/2020   Procedure: HERNIA REPAIR INGUINAL ADULT W/MESH;  Surgeon: Lucretia Roers, MD;   Location: [facility];  Service: General;  Laterality: Right;  pt knows to arrive at 6:15   SPINE SURGERY  08/2012   discectomy    Family History  Problem Relation Age of Onset   Hypertension Mother    Heart disease Mother 30   Cancer Father        prostate   Sickle cell trait Father    Sickle cell trait Son    Cancer Maternal Grandmother        bone   Cancer Paternal Grandmother    Cancer Paternal Grandfather        liver   Learning disabilities Neg Hx     Allergies as of 01/16/2023 - Review Complete 01/16/2023  Allergen Reaction Noted   Hydrocodone-acetaminophen Nausea And Vomiting and Other (See Comments) 07/03/2020   Triamcinolone Rash 07/27/2022    Social History   Socioeconomic History   Marital status: Married    Spouse name: Not  on file   Number of children: 4   Years of education: 12   Highest education level: Not on file  Occupational History   Occupation: disabled    Comment: heating and air  Tobacco Use   Smoking status: Never   Smokeless tobacco: Never  Vaping Use   Vaping status: Never Used  Substance and Sexual Activity   Alcohol use: No   Drug use: No   Sexual activity: Not Currently    Birth control/protection: None  Other Topics Concern   Not on file  Social History Narrative   Living with mother   Moved to [place] to be near family   Social Determinants of Health   Financial Resource Strain: Not on file  Food Insecurity: Not on file  Transportation Needs: Not on file  Physical Activity: Not on file  Stress: Not on file  Social Connections: Not on file   Review of Systems   Gen: Denies fever, chills, anorexia. Denies fatigue, weakness, weight loss.  CV: Denies chest pain, palpitations, syncope, peripheral edema, and claudication. Resp: Denies dyspnea at rest, cough, wheezing, coughing up blood, and pleurisy. GI: See HPI Derm: Denies rash, itching, dry skin Psych: Denies depression, anxiety, memory loss, confusion. No homicidal or  suicidal ideation.  Heme: Denies bruising, bleeding, and enlarged lymph nodes.   Physical Exam   BP (!) 153/98 (BP Location: Left Arm, Patient Position: Sitting, Cuff Size: Normal)   Pulse 74   Temp 97.7 F (36.5 C) (Temporal)   Ht 5\' 11"  (1.803 m)   Wt 198 lb 3.2 oz (89.9 kg)   SpO2 96%   BMI 27.64 kg/m   General:   Alert and oriented. No distress noted. Pleasant and cooperative.  Head:  Normocephalic and atraumatic. Eyes:  Conjuctiva clear without scleral icterus. Mouth:  Oral mucosa pink and moist. Good dentition. No lesions. Abdomen:  +BS, soft, non-tender and non-distended. No rebound or guarding. No HSM or masses noted. Rectal: deferred Msk:  Symmetrical without gross deformities. Normal posture. Extremities:  Without edema. Neurologic:  Alert and  oriented x4 Psych:  Alert and cooperative. Normal mood and affect.  Assessment  Shannon Wilson is a 52 y.o. male with a history of ulcerative proctosigmoiditis, HLD, HTN, GERD, MI, TIA, vasculitis presenting today for follow-up.  Ulcerative proctosigmoid colitis: Colonoscopy March 2024 with nonbleeding internal hemorrhoids and active chronic proctosigmoid ulcerative colitis.  Had issues with compliance of mesalamine enemas therefore he was switched to oral Lialda.  He initially suspected an allergic reaction to oral Lialda with rashes however patient found to have had an allergic reaction to change in laundry detergent.  He is continued on Lialda 2.4 g daily and symptoms well-controlled per patient.  Denies any bowel frequency.  Stool semisoft without any overt diarrhea.  Also denies any abdominal pain, lack of appetite, worsening skin issues.  Recently obtained flu vaccine for the season.  Has never had a COVID-vaccine and has not seen dermatology.  Recent labs with stable LFTs and no evidence of anemia.  Again recommended for him to follow-up regarding pneumonia vaccination.  Given he has had good control of symptoms other than when  eating spicy foods we will plan to repeat colonoscopy in February/March of next year to assess for mucosal remission.  PLAN   Continue Lialda 2.4 g daily.  Colonoscopy Feb/March 2025 PSV23 vaccination recommended.  Plan to check CRP, vitamin D, and vitamin B12 at next follow-up Follow up 6 months    Brooke Bonito, MSN, FNP-BC,  AGACNP-BC [facility]

## 2023-01-16 ENCOUNTER — Ambulatory Visit: Payer: Medicare Other | Admitting: Gastroenterology

## 2023-01-16 ENCOUNTER — Encounter: Payer: Self-pay | Admitting: Gastroenterology

## 2023-01-16 VITALS — BP 153/98 | HR 74 | Temp 97.7°F | Ht 71.0 in | Wt 198.2 lb

## 2023-01-16 DIAGNOSIS — I10 Essential (primary) hypertension: Secondary | ICD-10-CM

## 2023-01-16 DIAGNOSIS — K5130 Ulcerative (chronic) rectosigmoiditis without complications: Secondary | ICD-10-CM
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
## 2023-01-24 DIAGNOSIS — M546 Pain in thoracic spine: Secondary | ICD-10-CM | POA: Diagnosis not present

## 2023-01-24 DIAGNOSIS — I10 Essential (primary) hypertension: Secondary | ICD-10-CM | POA: Diagnosis not present

## 2023-01-24 DIAGNOSIS — K5130 Ulcerative (chronic) rectosigmoiditis without complications: Secondary | ICD-10-CM | POA: Diagnosis not present

## 2023-02-24 ENCOUNTER — Emergency Department (HOSPITAL_COMMUNITY)
Admission: EM | Admit: 2023-02-24 | Discharge: 2023-02-24 | Disposition: A | Discharge status: Home / Self Care | Payer: Medicare Other | Attending: Emergency Medicine | Admitting: Emergency Medicine

## 2023-02-24 ENCOUNTER — Other Ambulatory Visit: Payer: Self-pay

## 2023-02-24 DIAGNOSIS — S61412A Laceration without foreign body of left hand, initial encounter: Secondary | ICD-10-CM | POA: Diagnosis not present

## 2023-02-24 DIAGNOSIS — S6992XA Unspecified injury of left wrist, hand and finger(s), initial encounter: Secondary | ICD-10-CM | POA: Diagnosis present

## 2023-02-24 DIAGNOSIS — Z7982 Long term (current) use of aspirin: Secondary | ICD-10-CM | POA: Insufficient documentation

## 2023-02-24 DIAGNOSIS — W268XXA Contact with other sharp object(s), not elsewhere classified, initial encounter: Secondary | ICD-10-CM | POA: Insufficient documentation

## 2023-02-24 MED ORDER — BACITRACIN ZINC 500 UNIT/GM EX OINT
TOPICAL_OINTMENT | Freq: Two times a day (BID) | CUTANEOUS | Status: DC
  Filled 2023-02-24: qty 0.9

## 2023-02-24 MED ORDER — LIDOCAINE-EPINEPHRINE (PF) 2 %-1:200000 IJ SOLN
INTRAMUSCULAR | Status: AC
  Filled 2023-02-24: qty 20

## 2023-02-24 MED ORDER — MUPIROCIN 2 % EX OINT: 1.0000 | TOPICAL_OINTMENT | Freq: Two times a day (BID) | CUTANEOUS | 0 refills | Status: DC

## 2023-02-24 MED ORDER — NAPROXEN 500 MG PO TABS: 500.0000 mg | ORAL_TABLET | Freq: Two times a day (BID) | ORAL | 0 refills | Status: DC

## 2023-02-24 NOTE — ED Notes (Signed)
Pt in bed, pt reports decreased pain, dressing placed per md instructions, pt states that he is ready to go home, pt verbalized understanding d/c and follow up, reviewed prescriptions, pt ambulatory from department with sig other

## 2023-02-24 NOTE — ED Triage Notes (Signed)
Pt arrived POV with c/o stainless steel metal cut his left inner palm of his hand while he was pushing a stove. Bleeding under control at this time. No blood thinner noted.

## 2023-02-24 NOTE — ED Notes (Signed)
EDP at bedside during triage 

## 2023-02-24 NOTE — ED Provider Notes (Signed)
[place] EMERGENCY DEPARTMENT AT [facility] Provider Note   CSN: [id number] Arrival date & time: 02/24/23  1518     History  Chief Complaint  Patient presents with   Laceration    Shannon Wilson is a 52 y.o. male.   Laceration    This patient is a 52 year old male, he is not anticoagulated, he presents after having an injury to his left hand which occurred while he was trying to push a stove moving it from 1 house to another, there was a free piece of metal on the back of the stove that cut into the thenar eminence of his left hand on the palmar surface causing acute onset of pain and associated heavy bleeding.  A pressure dressing was placed prehospital and when he arrived this was changed out with a sterile dressing.  The patient is up-to-date on tetanus within the last 6 months, he denies any other injuries  Home Medications Prior to Admission medications   Medication Sig Start Date End Date Taking? Authorizing Provider  mupirocin ointment (BACTROBAN) 2 % Apply 1 Application topically 2 (two) times daily. 02/24/23  Yes Eber Hong, MD  naproxen (NAPROSYN) 500 MG tablet Take 1 tablet (500 mg total) by mouth 2 (two) times daily with a meal. 02/24/23  Yes Eber Hong, MD  amLODipine (NORVASC) 10 MG tablet Take 1 tablet (10 mg total) by mouth daily. 11/03/18   Terrilee Files, MD  aspirin EC 81 MG tablet Take 1 tablet (81 mg total) by mouth 2 (two) times a week. 10/18/20   Lucretia Roers, MD  baclofen (LIORESAL) 10 MG tablet Take 10 mg by mouth 3 (three) times daily as needed for muscle spasms.    [provider]  gabapentin (NEURONTIN) 300 MG capsule Take 300 mg by mouth 3 (three) times daily as needed (nerve pain).    [provider]  ibuprofen (ADVIL) 600 MG tablet Take 1 tablet (600 mg total) by mouth every 8 (eight) hours as needed. 07/27/22   Derwood Kaplan, MD  lisinopril (ZESTRIL) 40 MG tablet Take 40 mg by mouth daily.    [provider]  meloxicam (MOBIC) 15 MG tablet TAKE 1 TABLET BY MOUTH ONCE DAILY *REFILL REQUEST*    [provider]  mesalamine (LIALDA) 1.2 g EC tablet Take 4 tablets (4.8 g total) by mouth daily with breakfast. Take 4 tablets daily for 2 months then decrease to 2 tablets daily. Patient not taking: Reported on 08/24/2022 06/19/22 06/19/23  Lanelle Bal, DO  rosuvastatin (CRESTOR) 20 MG tablet Take 20 mg by mouth at bedtime. 07/21/20   [provider]      Allergies    Hydrocodone-acetaminophen and Triamcinolone    Review of Systems   Review of Systems  All other systems reviewed and are negative.   Physical Exam Updated Vital Signs BP (!) 178/118   Pulse 84   Temp 98.4 F (36.9 C) (Oral)   Resp 17   Ht 1.803 m (5\' 11" )   Wt 89.4 kg   SpO2 98%   BMI 27.48 kg/m  Physical Exam Vitals and nursing note reviewed.  Constitutional:      Appearance: He is well-developed. He is not diaphoretic.  HENT:     Head: Normocephalic and atraumatic.  Eyes:     General:        Right eye: No discharge.        Left eye: No discharge.     Conjunctiva/sclera: Conjunctivae  normal.  Pulmonary:     Effort: Pulmonary effort is normal. No respiratory distress.  Musculoskeletal:     Comments: The patient has full range of motion actively with his thumb into all directions, he has no numbness or weakness of the thumb  Skin:    General: Skin is warm and dry.     Findings: No erythema or rash.  Neurological:     Mental Status: He is alert.     Coordination: Coordination normal.     Comments: Normal neurologic exam of the hand     ED Results / Procedures / Treatments   Labs (all labs ordered are listed, but only abnormal results are displayed) Labs Reviewed - No data to display  EKG None  Radiology No results found.  Procedures .Marland KitchenLaceration Repair  Date/Time: 02/24/2023 3:56 PM  Performed by: Eber Hong, MD Authorized by: Eber Hong, MD   Consent:     Consent obtained:  Verbal   Consent given by:  Patient   Risks, benefits, and alternatives were discussed: yes     Risks discussed:  Pain, infection, retained foreign body, tendon damage, poor cosmetic result, need for additional repair, nerve damage, poor wound healing and vascular damage   Alternatives discussed:  No treatment Universal protocol:    Procedure explained and questions answered to patient or proxy's satisfaction: yes     Immediately prior to procedure, a time out was called: yes   Anesthesia:    Anesthesia method:  Local infiltration   Local anesthetic:  Lidocaine 1% WITH epi Laceration details:    Location:  Hand   Length (cm):  6   Depth (mm):  6 Pre-procedure details:    Preparation:  Patient was prepped and draped in usual sterile fashion Exploration:    Limited defect created (wound extended): no     Hemostasis achieved with:  Tourniquet   Wound exploration: wound explored through full range of motion and entire depth of wound visualized     Wound extent: fascia violated and muscle damage     Wound extent: areolar tissue not violated, no foreign body, no nerve damage, no tendon damage, no underlying fracture and no vascular damage     Contaminated: no   Treatment:    Area cleansed with:  Povidone-iodine   Irrigation solution:  Sterile saline   Irrigation volume:  1000 Skin repair:    Repair method:  Sutures   Suture size:  4-0   Suture material:  Prolene   Suture technique:  Simple interrupted   Number of sutures:  5 Approximation:    Approximation:  Close Repair type:    Repair type:  Intermediate Post-procedure details:    Dressing:  Antibiotic ointment and sterile dressing   Procedure completion:  Tolerated well, no immediate complications Comments:           Medications Ordered in ED Medications  lidocaine-EPINEPHrine (XYLOCAINE W/EPI) 2 %-1:200000 (PF) injection (has no administration in time range)    ED Course/ Medical Decision Making/  A&P                                 Medical Decision Making Risk Prescription drug management.   Patient has an obvious laceration which is deep to the thenar eminence involves the muscular structures as well.  This does not go any deeper, there is no arterial bleeding that I can see just a venous ooze.  A manual  blood pressure cuff was used as a tourniquet on the upper arm to get a bloodless field through which I explored the wound and found no foreign bodies, I irrigated the wound with 1 L of normal saline, the tissue appears very healthy, there is no foreign bodies found, the wound edges were approximated and closed with 5 sutures of 4-0 Prolene.  The patient was instructed on wound care and is agreeable to the plan for discharge, I do not think he needs imaging he does not need tetanus, he does not need antibiotics, this was a very clean appearing wound which was irrigated copiously.  He is agreeable to the plan and has good follow-up with Dr. Margo Aye locally        Final Clinical Impression(s) / ED Diagnoses Final diagnoses:  Laceration of left hand without foreign body, initial encounter    Rx / DC Orders ED Discharge Orders          Ordered    naproxen (NAPROSYN) 500 MG tablet  2 times daily with meals        02/24/23 1554    mupirocin ointment (BACTROBAN) 2 %  2 times daily        02/24/23 1554              Eber Hong, MD 02/24/23 1558

## 2023-02-24 NOTE — Discharge Instructions (Signed)
You may take ibuprofen or Naprosyn which I have prescribed for the pain, keep a topical antibiotic on the wound at all times for the next several days, please keep a sterile dressing on the wound, keep it dry and clean, see your doctor within 7 days for recheck and to have stitches removed within 7 to 10 days, return to the ER for severe worsening pain swelling pus fever or spreading redness.

## 2023-03-06 DIAGNOSIS — I10 Essential (primary) hypertension: Secondary | ICD-10-CM | POA: Diagnosis not present

## 2023-03-06 DIAGNOSIS — Z4802 Encounter for removal of sutures: Secondary | ICD-10-CM | POA: Diagnosis not present

## 2023-03-15 DIAGNOSIS — Z23 Encounter for immunization: Secondary | ICD-10-CM | POA: Diagnosis not present

## 2023-04-19 DIAGNOSIS — I10 Essential (primary) hypertension: Secondary | ICD-10-CM | POA: Diagnosis not present

## 2023-04-19 DIAGNOSIS — Z79899 Other long term (current) drug therapy: Secondary | ICD-10-CM | POA: Diagnosis not present

## 2023-04-19 DIAGNOSIS — M545 Low back pain, unspecified: Secondary | ICD-10-CM | POA: Diagnosis not present

## 2023-04-19 DIAGNOSIS — Z131 Encounter for screening for diabetes mellitus: Secondary | ICD-10-CM | POA: Diagnosis not present

## 2023-04-19 DIAGNOSIS — G894 Chronic pain syndrome: Secondary | ICD-10-CM | POA: Diagnosis not present

## 2023-04-19 DIAGNOSIS — R030 Elevated blood-pressure reading, without diagnosis of hypertension: Secondary | ICD-10-CM | POA: Diagnosis not present

## 2023-04-19 DIAGNOSIS — Z0000 Encounter for general adult medical examination without abnormal findings: Secondary | ICD-10-CM | POA: Diagnosis not present

## 2023-04-19 DIAGNOSIS — R5383 Other fatigue: Secondary | ICD-10-CM | POA: Diagnosis not present

## 2023-04-19 DIAGNOSIS — E7800 Pure hypercholesterolemia, unspecified: Secondary | ICD-10-CM | POA: Diagnosis not present

## 2023-04-19 DIAGNOSIS — M129 Arthropathy, unspecified: Secondary | ICD-10-CM | POA: Diagnosis not present

## 2023-05-03 ENCOUNTER — Emergency Department (HOSPITAL_COMMUNITY): Payer: Medicare Other

## 2023-05-03 ENCOUNTER — Emergency Department (HOSPITAL_COMMUNITY)
Admission: EM | Admit: 2023-05-03 | Discharge: 2023-05-03 | Disposition: A | Discharge status: Home / Self Care | Payer: Medicare Other

## 2023-05-03 ENCOUNTER — Other Ambulatory Visit: Payer: Self-pay

## 2023-05-03 DIAGNOSIS — Z23 Encounter for immunization: Secondary | ICD-10-CM | POA: Insufficient documentation

## 2023-05-03 DIAGNOSIS — W010XXA Fall on same level from slipping, tripping and stumbling without subsequent striking against object, initial encounter: Secondary | ICD-10-CM | POA: Insufficient documentation

## 2023-05-03 DIAGNOSIS — S61431A Puncture wound without foreign body of right hand, initial encounter: Secondary | ICD-10-CM | POA: Diagnosis not present

## 2023-05-03 DIAGNOSIS — Z7982 Long term (current) use of aspirin: Secondary | ICD-10-CM | POA: Diagnosis not present

## 2023-05-03 DIAGNOSIS — S59911A Unspecified injury of right forearm, initial encounter: Secondary | ICD-10-CM | POA: Diagnosis not present

## 2023-05-03 DIAGNOSIS — S6991XA Unspecified injury of right wrist, hand and finger(s), initial encounter: Secondary | ICD-10-CM | POA: Diagnosis present

## 2023-05-03 MED ORDER — TETANUS-DIPHTH-ACELL PERTUSSIS 5-2.5-18.5 LF-MCG/0.5 IM SUSY
0.5000 mL | PREFILLED_SYRINGE | Freq: Once | INTRAMUSCULAR | Status: AC
  Administered 2023-05-03: 0.5 mL via INTRAMUSCULAR
  Filled 2023-05-03: qty 0.5

## 2023-05-03 MED ORDER — TRAMADOL HCL 50 MG PO TABS
50.0000 mg | ORAL_TABLET | Freq: Once | ORAL | Status: AC
  Administered 2023-05-03: 50 mg via ORAL
  Filled 2023-05-03: qty 1

## 2023-05-03 MED ORDER — KETOROLAC TROMETHAMINE 60 MG/2ML IM SOLN
15.0000 mg | Freq: Once | INTRAMUSCULAR | Status: AC
  Administered 2023-05-03: 15 mg via INTRAMUSCULAR
  Filled 2023-05-03: qty 2

## 2023-05-03 MED ORDER — ONDANSETRON 4 MG PO TBDP
4.0000 mg | ORAL_TABLET | Freq: Once | ORAL | Status: AC
  Administered 2023-05-03: 4 mg via ORAL
  Filled 2023-05-03: qty 1

## 2023-05-03 MED ORDER — CEPHALEXIN 500 MG PO CAPS: 500.0000 mg | ORAL_CAPSULE | Freq: Four times a day (QID) | ORAL | 0 refills | Status: DC

## 2023-05-03 NOTE — Discharge Instructions (Signed)
You are seen in the emergency room today after fall.  Your x-rays are reassuring without any fracture or foreign body.  I would recommend alternating Tylenol and ibuprofen for pain control.  You can also use ice.  Please keep the area clean dry and covered.  If sent Keflex to your pharmacy please take as prescribed.  If you have any signs of poor wound healing or drainage please return to emergency room.  If you start experiencing severe pain numbness or tingling of your hand please return to emergency room immediately.

## 2023-05-03 NOTE — ED Provider Triage Note (Signed)
Emergency Medicine Provider Triage Evaluation Note  Shannon Wilson , a 53 y.o. male  was evaluated in triage.  Pt complains of fall, right wrist pain, puncture wound.  Review of Systems  Positive: Right palm wound, bleeding controlled Negative: Numbness, tingling   Physical Exam  BP (!) 153/110   Pulse 90   Temp 98.8 F (37.1 C) (Oral)   Resp 16   Ht 5\' 11"  (1.803 m)   Wt 89.4 kg   SpO2 100%   BMI 27.48 kg/m  Gen:   Awake, no distress   Resp:  Normal effort  MSK:   Moves extremities without difficulty  Other:  Vascularly intact, bleeding controlled  Medical Decision Making  Medically screening exam initiated at 2:59 PM.  Appropriate orders placed.  Shannon Wilson was informed that the remainder of the evaluation will be completed by another provider, this initial triage assessment does not replace that evaluation, and the importance of remaining in the ED until their evaluation is complete.  Right wrist pain - medial aspect. Soft compartment, bleeding controlled. Tdap not UTD   Smitty Knudsen, PA-C 05/03/23 1500
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
## 2023-05-03 NOTE — ED Provider Notes (Signed)
[place] EMERGENCY DEPARTMENT AT [facility] Provider Note   CSN: [id number] Arrival date & time: 05/03/23  1357     History  Chief Complaint  Patient presents with   Hand Injury    Shannon Wilson is a 53 y.o. male patient reporting to emergency room after he had a fall.  Patient reports that he fell forward catching himself with his right wrist.  Unfortunately he did fall on the end or flat aspect of them now.  This did puncture his skin on the palmar aspect of his right hand.  Bleeding is controlled.  He does not have any numbness or tingling.  He reports wrist and distal forearm pain.  He reports he does not have updated tetanus shot.  When he fell he did not hit his head and reports no loss of consciousness or confusion since event.  Denies any chest pain shortness of breath headache.    Hand Injury      Home Medications Prior to Admission medications   Medication Sig Start Date End Date Taking? Authorizing Provider  amLODipine (NORVASC) 10 MG tablet Take 1 tablet (10 mg total) by mouth daily. 11/03/18   Terrilee Files, MD  aspirin EC 81 MG tablet Take 1 tablet (81 mg total) by mouth 2 (two) times a week. 10/18/20   Lucretia Roers, MD  baclofen (LIORESAL) 10 MG tablet Take 10 mg by mouth 3 (three) times daily as needed for muscle spasms.    [provider]  gabapentin (NEURONTIN) 300 MG capsule Take 300 mg by mouth 3 (three) times daily as needed (nerve pain).    [provider]  ibuprofen (ADVIL) 600 MG tablet Take 1 tablet (600 mg total) by mouth every 8 (eight) hours as needed. 07/27/22   Derwood Kaplan, MD  lisinopril (ZESTRIL) 40 MG tablet Take 40 mg by mouth daily.    [provider]  meloxicam (MOBIC) 15 MG tablet TAKE 1 TABLET BY MOUTH ONCE DAILY *REFILL REQUEST*    [provider]  mesalamine (LIALDA) 1.2 g EC tablet Take 4 tablets (4.8 g total) by mouth daily with breakfast. Take 4 tablets daily for 2 months then decrease  to 2 tablets daily. Patient not taking: Reported on 08/24/2022 06/19/22 06/19/23  Lanelle Bal, DO  mupirocin ointment (BACTROBAN) 2 % Apply 1 Application topically 2 (two) times daily. 02/24/23   Eber Hong, MD  naproxen (NAPROSYN) 500 MG tablet Take 1 tablet (500 mg total) by mouth 2 (two) times daily with a meal. 02/24/23   Eber Hong, MD  rosuvastatin (CRESTOR) 20 MG tablet Take 20 mg by mouth at bedtime. 07/21/20   [provider]      Allergies    Hydrocodone-acetaminophen and Triamcinolone    Review of Systems   Review of Systems  Skin:  Positive for wound.    Physical Exam Updated Vital Signs BP (!) 153/110   Pulse 90   Temp 98.8 F (37.1 C) (Oral)   Resp 16   Ht 5\' 11"  (1.803 m)   Wt 89.4 kg   SpO2 100%   BMI 27.48 kg/m  Physical Exam Vitals and nursing note reviewed.  Constitutional:      General: He is not in acute distress.    Appearance: He is not toxic-appearing.  HENT:     Head: Normocephalic and atraumatic.  Eyes:     General: No scleral icterus.    Conjunctiva/sclera: Conjunctivae normal.  Cardiovascular:     Rate  and Rhythm: Normal rate and regular rhythm.     Pulses: Normal pulses.     Heart sounds: Normal heart sounds.  Pulmonary:     Effort: Pulmonary effort is normal. No respiratory distress.     Breath sounds: Normal breath sounds.  Abdominal:     General: Abdomen is flat. Bowel sounds are normal.     Palpations: Abdomen is soft.     Tenderness: There is no abdominal tenderness.  Musculoskeletal:     Right lower leg: No edema.     Left lower leg: No edema.  Skin:    General: Skin is warm and dry.     Findings: No lesion.     Comments: Small 1/2cm x 1/2 puncture wound, superficial bleeding controlled Patient has strong radial pulse equal bilaterally.  Sensation equal bilaterally and strength equal bilaterally. Compartments soft.   Neurological:     General: No focal deficit present.     Mental Status: He is alert and  oriented to person, place, and time. Mental status is at baseline.     ED Results / Procedures / Treatments   Labs (all labs ordered are listed, but only abnormal results are displayed) Labs Reviewed - No data to display  EKG None  Radiology DG Forearm Right Result Date: 05/03/2023 CLINICAL DATA:  Fall and trauma to the right upper extremity. EXAM: RIGHT FOREARM - 2 VIEW COMPARISON:  Right hand radiograph dated 05/03/2023. FINDINGS: There is no evidence of fracture or other focal bone lesions. Soft tissues are unremarkable. IMPRESSION: Negative. Electronically Signed   By: Elgie Collard M.D.   On: 05/03/2023 15:21   DG Hand Complete Right Result Date: 05/03/2023 CLINICAL DATA:  Injury.  Puncture wound. EXAM: RIGHT HAND - COMPLETE 3+ VIEW COMPARISON:  None Available. FINDINGS: No acute fracture or dislocation. No aggressive osseous lesion. No significant arthritis of imaged joints. No radiopaque foreign bodies. Soft tissues are within normal limits. No focal soft tissue defect or air within the soft tissue. IMPRESSION: *No acute osseous abnormality of the right hand. Electronically Signed   By: Jules Schick M.D.   On: 05/03/2023 15:12    Procedures Procedures    Medications Ordered in ED Medications  Tdap (BOOSTRIX) injection 0.5 mL (0.5 mLs Intramuscular Given 05/03/23 1520)    ED Course/ Medical Decision Making/ A&P Clinical Course as of 05/03/23 1940  Thu May 03, 2023  1800 DG Hand Complete Right [JB]    Clinical Course User Index [JB] Mayanna Garlitz, Horald Chestnut, PA-C                                 Medical Decision Making Amount and/or Complexity of Data Reviewed Radiology: ordered. Decision-making details documented in ED Course.  Risk Prescription drug management.   This patient presents to the ED for concern of puncture wound, this involves an extensive number of treatment options, and is a complaint that carries with it a high risk of complications and morbidity.  The  differential diagnosis includes laceration, abrasion, fracture, sprain, compartment syndrome  Imaging Studies ordered:  I ordered imaging studies including right hand and wrist   I independently visualized and interpreted imaging which showed no acute abnormality  I agree with the radiologist interpretation  Problem List / ED Course / Critical interventions / Medication management  Patient reported to emergency room after fall.  Patient reports that he tripped and fell onto dorsiflexed wrist.  He is  now reporting distal forearm pain.  No obvious deformity, ecchymosis or edema on exam.  He is neurovascularly intact.  X-rays without any acute fracture.  Patient did vomit of his tramadol thus gave Zofran and IM Toradol prior to discharge.  Patient reports his pain has improved.  I did give him a wrist brace as well and discussed return precautions.  Wound was cleaned and bleeding was controlled. Patient well appearing and not endorsing other injuries. On reassessment the continues to endorse pain but denies any numbness, tingling or changes in sensation.  Continues to have strong radial pulse.  Given return precautions as well as follow-up recommendations.  Patient is stable for discharge. I ordered medication including tramadol, Toradol, tdap Reevaluation of the patient after these medicines showed that the patient improved I have reviewed the patients home medicines and have made adjustments as needed   Plan  F/u w/ PCP in 2-3d to ensure resolution of sx.  Patient was given return precautions. Patient stable for discharge at this time.  Patient educated on sx/dx and verbalized understanding of plan. Return to ER w/ new or worsening sx.          Final Clinical Impression(s) / ED Diagnoses Final diagnoses:  Puncture wound of right hand without foreign body, initial encounter    Rx / DC Orders ED Discharge Orders     None         Reinaldo Raddle 05/03/23 1945    Durwin Glaze, MD 05/03/23 2322

## 2023-05-15 ENCOUNTER — Encounter (INDEPENDENT_AMBULATORY_CARE_PROVIDER_SITE_OTHER): Payer: Self-pay | Admitting: *Deleted

## 2023-05-21 ENCOUNTER — Observation Stay (HOSPITAL_COMMUNITY)
Admission: EM | Admit: 2023-05-21 | Discharge: 2023-05-22 | Disposition: A | Discharge status: Home / Self Care | Payer: Medicare Other | Attending: Student | Admitting: Student

## 2023-05-21 ENCOUNTER — Other Ambulatory Visit: Payer: Self-pay

## 2023-05-21 ENCOUNTER — Observation Stay (HOSPITAL_COMMUNITY): Payer: Medicare Other

## 2023-05-21 ENCOUNTER — Encounter (HOSPITAL_COMMUNITY): Payer: Self-pay | Admitting: Emergency Medicine

## 2023-05-21 ENCOUNTER — Emergency Department (HOSPITAL_COMMUNITY): Payer: Medicare Other

## 2023-05-21 DIAGNOSIS — I2510 Atherosclerotic heart disease of native coronary artery without angina pectoris: Secondary | ICD-10-CM | POA: Insufficient documentation

## 2023-05-21 DIAGNOSIS — Z79899 Other long term (current) drug therapy: Secondary | ICD-10-CM | POA: Insufficient documentation

## 2023-05-21 DIAGNOSIS — E782 Mixed hyperlipidemia: Secondary | ICD-10-CM | POA: Insufficient documentation

## 2023-05-21 DIAGNOSIS — Z8673 Personal history of transient ischemic attack (TIA), and cerebral infarction without residual deficits: Secondary | ICD-10-CM | POA: Insufficient documentation

## 2023-05-21 DIAGNOSIS — Z7982 Long term (current) use of aspirin: Secondary | ICD-10-CM | POA: Insufficient documentation

## 2023-05-21 DIAGNOSIS — R29818 Other symptoms and signs involving the nervous system: Secondary | ICD-10-CM | POA: Diagnosis not present

## 2023-05-21 DIAGNOSIS — G459 Transient cerebral ischemic attack, unspecified: Secondary | ICD-10-CM | POA: Diagnosis not present

## 2023-05-21 DIAGNOSIS — R2990 Unspecified symptoms and signs involving the nervous system: Principal | ICD-10-CM

## 2023-05-21 DIAGNOSIS — R531 Weakness: Secondary | ICD-10-CM

## 2023-05-21 DIAGNOSIS — I639 Cerebral infarction, unspecified: Secondary | ICD-10-CM | POA: Diagnosis not present

## 2023-05-21 DIAGNOSIS — I10 Essential (primary) hypertension: Secondary | ICD-10-CM | POA: Diagnosis present

## 2023-05-21 LAB — PROTIME-INR
INR: 1 (ref 0.8–1.2)
Prothrombin Time: 13.6 s (ref 11.4–15.2)

## 2023-05-21 LAB — COMPREHENSIVE METABOLIC PANEL
ALT: 20 U/L (ref 0–44)
AST: 23 U/L (ref 15–41)
Albumin: 3.8 g/dL (ref 3.5–5.0)
Alkaline Phosphatase: 84 U/L (ref 38–126)
Anion gap: 8 (ref 5–15)
BUN: 15 mg/dL (ref 6–20)
CO2: 25 mmol/L (ref 22–32)
Calcium: 9 mg/dL (ref 8.9–10.3)
Chloride: 107 mmol/L (ref 98–111)
Creatinine, Ser: 1.3 mg/dL — ABNORMAL HIGH (ref 0.61–1.24)
GFR, Estimated: >60 mL/min (ref >60)
Glucose, Bld: 141 mg/dL — ABNORMAL HIGH (ref 70–99)
Potassium: 3.9 mmol/L (ref 3.5–5.1)
Sodium: 140 mmol/L (ref 135–145)
Total Bilirubin: 0.9 mg/dL (ref 0.0–1.2)
Total Protein: 7.2 g/dL (ref 6.5–8.1)

## 2023-05-21 LAB — CBC
HCT: 41.4 % (ref 39.0–52.0)
Hemoglobin: 14.2 g/dL (ref 13.0–17.0)
MCH: 30.5 pg (ref 26.0–34.0)
MCHC: 34.3 g/dL (ref 30.0–36.0)
MCV: 88.8 fL (ref 80.0–100.0)
Platelets: 297 10*3/uL (ref 150–400)
RBC: 4.66 MIL/uL (ref 4.22–5.81)
RDW: 13 % (ref 11.5–15.5)
WBC: 9 10*3/uL (ref 4.0–10.5)
nRBC: 0 % (ref 0.0–0.2)

## 2023-05-21 LAB — DIFFERENTIAL
Abs Immature Granulocytes: 0.03 10*3/uL (ref 0.00–0.07)
Basophils Absolute: 0.1 10*3/uL (ref 0.0–0.1)
Basophils Relative: 1 %
Eosinophils Absolute: 1.4 10*3/uL — ABNORMAL HIGH (ref 0.0–0.5)
Eosinophils Relative: 16 %
Immature Granulocytes: 0 %
Lymphocytes Relative: 34 %
Lymphs Abs: 3.1 10*3/uL (ref 0.7–4.0)
Monocytes Absolute: 0.6 10*3/uL (ref 0.1–1.0)
Monocytes Relative: 6 %
Neutro Abs: 3.8 10*3/uL (ref 1.7–7.7)
Neutrophils Relative %: 43 %

## 2023-05-21 LAB — ETHANOL: Alcohol, Ethyl (B): <10 mg/dL (ref <10)

## 2023-05-21 LAB — CBG MONITORING, ED: Glucose-Capillary: 127 mg/dL — ABNORMAL HIGH (ref 70–99)

## 2023-05-21 LAB — APTT: aPTT: 26 s (ref 24–36)

## 2023-05-21 MED ORDER — TENECTEPLASE FOR STROKE
PACK | INTRAVENOUS | Status: AC
  Filled 2023-05-21: qty 10

## 2023-05-21 MED ORDER — SODIUM CHLORIDE 0.9% FLUSH
3.0000 mL | Freq: Once | INTRAVENOUS | Status: AC
  Administered 2023-05-21: 3 mL via INTRAVENOUS

## 2023-05-21 MED ORDER — CLOPIDOGREL BISULFATE 75 MG PO TABS
300.0000 mg | ORAL_TABLET | Freq: Once | ORAL | Status: AC
Start: 2023-05-21 — End: 2023-05-21
  Administered 2023-05-21: 300 mg via ORAL
  Filled 2023-05-21: qty 4

## 2023-05-21 MED ORDER — ASPIRIN 325 MG PO TABS
325.0000 mg | ORAL_TABLET | Freq: Once | ORAL | Status: AC
  Administered 2023-05-21: 325 mg via ORAL
  Filled 2023-05-21: qty 1

## 2023-05-21 NOTE — ED Triage Notes (Signed)
 Pt arrived to ED c/o left sided numbness, left arm weakness, left sided facial droop. Pt states symptoms began at 7pm tonight. Pt states hx of CVA. Pt does not take blood thinners.

## 2023-05-21 NOTE — ED Notes (Signed)
 Tele neuro MD arrived at 2133.

## 2023-05-21 NOTE — H&P (Addendum)
History and Physical    Patient: Shannon Wilson UJW:[id number] DOB: 31-Mar-1971 DOA: 05/21/2023 DOS: the patient was seen and examined on 05/22/2023 PCP: Benita Stabile, MD  Patient coming from: Home  Chief Complaint:  Chief Complaint  Patient presents with   Code Stroke   HPI: Shannon Wilson is a 53 y.o. male with medical history significant of hypertension, hyperlipidemia, prior stroke with mild left-sided weakness who presents to the emergency department due to sudden onset of facial numbness and left-sided weakness which occurred around 7 PM while at rest.  There has been a slight improvement in his symptoms since onset, however, symptoms have not resolved.  He was supposed to be taking aspirin 81 mg orally daily, but he has not been consistently compliant with this.  ED Course:  In the emergency department, BP was 159/113, other vital signs are within normal range.  Workup in the ED showed normal CBC and BMP except for blood glucose of 141 and creatinine of 1.30.  Alcohol level was less than 10. CT head without contrast showed no evidence of acute intracranial abnormality Teleneurology was consulted and recommended further stroke workup.  Hospitalist was asked admit patient for further evaluation and management.  Review of Systems: Review of systems as noted in the HPI. All other systems reviewed and are negative.   Past Medical History:  Diagnosis Date   Back pain    GERD (gastroesophageal reflux disease)    Hyperlipidemia    Hypertension    Kidney stone    Lumbosacral radiculopathy at S1 09/04/2017   Migraine    Myocardial infarction [facility])    Stroke [facility])    TIA (transient ischemic attack)    Vasculitis (HCC)    celiac artery vasculitis   Past Surgical History:  Procedure Laterality Date   BACK SURGERY  08/2012   BIOPSY  02/11/2021   Procedure: BIOPSY;  Surgeon: Lanelle Bal, DO;  Location: [facility] ENDO SUITE;  Service: Endoscopy;;   BIOPSY  06/19/2022   Procedure: BIOPSY;   Surgeon: Lanelle Bal, DO;  Location: [facility];  Service: Endoscopy;;   COLONOSCOPY WITH PROPOFOL N/A 02/11/2021   Procedure: COLONOSCOPY WITH PROPOFOL;  Surgeon: Lanelle Bal, DO;  Location: [facility];  Service: Endoscopy;  Laterality: N/A;  9:15 / ASA III   COLONOSCOPY WITH PROPOFOL N/A 06/19/2022   Procedure: COLONOSCOPY WITH PROPOFOL;  Surgeon: Lanelle Bal, DO;  Location: [facility];  Service: Endoscopy;  Laterality: N/A;  8:30 am   asa 3   INGUINAL HERNIA REPAIR Right 10/18/2020   Procedure: HERNIA REPAIR INGUINAL ADULT W/MESH;  Surgeon: Lucretia Roers, MD;  Location: [facility] ORS;  Service: General;  Laterality: Right;  pt knows to arrive at 6:15   SPINE SURGERY  08/2012   discectomy    Social History:  reports that he has never smoked. He has never used smokeless tobacco. He reports that he does not drink alcohol and does not use drugs.   Allergies  Allergen Reactions   Hydrocodone-Acetaminophen Nausea And Vomiting and Other (See Comments)   Triamcinolone Rash    Itching and rash per spouse and pt    Family History  Problem Relation Age of Onset   Hypertension Mother    Heart disease Mother 57   Cancer Father        prostate   Sickle cell trait Father    Sickle cell trait Son    Cancer Maternal Grandmother  bone   Cancer Paternal Grandmother    Cancer Paternal Grandfather        liver   Learning disabilities Neg Hx      Prior to Admission medications   Medication Sig Start Date End Date Taking? Authorizing Provider  amLODipine (NORVASC) 10 MG tablet Take 1 tablet (10 mg total) by mouth daily. 11/03/18   Terrilee Files, MD  aspirin EC 81 MG tablet Take 1 tablet (81 mg total) by mouth 2 (two) times a week. 10/18/20   Lucretia Roers, MD  baclofen (LIORESAL) 10 MG tablet Take 10 mg by mouth 3 (three) times daily as needed for muscle spasms.    [provider]  cephALEXin (KEFLEX) 500 MG capsule Take 1 capsule (500 mg total) by  mouth 4 (four) times daily. 05/03/23   Barrett, Horald Chestnut, PA-C  gabapentin (NEURONTIN) 300 MG capsule Take 300 mg by mouth 3 (three) times daily as needed (nerve pain).    [provider]  ibuprofen (ADVIL) 600 MG tablet Take 1 tablet (600 mg total) by mouth every 8 (eight) hours as needed. 07/27/22   Derwood Kaplan, MD  lisinopril (ZESTRIL) 40 MG tablet Take 40 mg by mouth daily.    [provider]  meloxicam (MOBIC) 15 MG tablet TAKE 1 TABLET BY MOUTH ONCE DAILY *REFILL REQUEST*    [provider]  mesalamine (LIALDA) 1.2 g EC tablet Take 4 tablets (4.8 g total) by mouth daily with breakfast. Take 4 tablets daily for 2 months then decrease to 2 tablets daily. Patient not taking: Reported on 08/24/2022 06/19/22 06/19/23  Lanelle Bal, DO  mupirocin ointment (BACTROBAN) 2 % Apply 1 Application topically 2 (two) times daily. 02/24/23   Eber Hong, MD  naproxen (NAPROSYN) 500 MG tablet Take 1 tablet (500 mg total) by mouth 2 (two) times daily with a meal. 02/24/23   Eber Hong, MD  rosuvastatin (CRESTOR) 20 MG tablet Take 20 mg by mouth at bedtime. 07/21/20   [provider]    Physical Exam: BP (!) 154/93   Pulse (!) 58   Temp 97.9 F (36.6 C) (Oral)   Resp 19   Ht 5\' 11"  (1.803 m)   Wt 89.4 kg   SpO2 96%   BMI 27.48 kg/m   General: 53 y.o. year-old male well developed well nourished in no acute distress.  Alert and oriented x3. HEENT: NCAT, EOMI Neck: Supple, trachea medial Cardiovascular: Regular rate and rhythm with no rubs or gallops.  No thyromegaly or JVD noted.  No lower extremity edema. 2/4 pulses in all 4 extremities. Respiratory: Clear to auscultation with no wheezes or rales. Good inspiratory effort. Abdomen: Soft, nontender nondistended with normal bowel sounds x4 quadrants. Muskuloskeletal: No cyanosis, clubbing or edema noted bilaterally Neuro: CN II-XII intact, strength 5/5 x 4, sensation, reflexes intact Skin: No ulcerative  lesions noted or rashes Psychiatry: Judgement and insight appear normal. Mood is appropriate for condition and setting          Labs on Admission:  Basic Metabolic Panel: Recent Labs  Lab 05/21/23 2133  NA 140  K 3.9  CL 107  CO2 25  GLUCOSE 141*  BUN 15  CREATININE 1.30*  CALCIUM 9.0   Liver Function Tests: Recent Labs  Lab 05/21/23 2133  AST 23  ALT 20  ALKPHOS 84  BILITOT 0.9  PROT 7.2  ALBUMIN 3.8   No results for input(s): "LIPASE", "AMYLASE" in the last 168 hours. No results for input(s): "  AMMONIA" in the last 168 hours. CBC: Recent Labs  Lab 05/21/23 2133  WBC 9.0  NEUTROABS 3.8  HGB 14.2  HCT 41.4  MCV 88.8  PLT 297   Cardiac Enzymes: No results for input(s): "CKTOTAL", "CKMB", "CKMBINDEX", "TROPONINI" in the last 168 hours.  BNP (last 3 results) No results for input(s): "BNP" in the last 8760 hours.  ProBNP (last 3 results) No results for input(s): "PROBNP" in the last 8760 hours.  CBG: Recent Labs  Lab 05/21/23 2116  GLUCAP 127*    Radiological Exams on Admission: MR BRAIN WO CONTRAST Result Date: 05/22/2023 CLINICAL DATA:  Neuro deficit, acute, stroke suspected. EXAM: MRI HEAD WITHOUT CONTRAST TECHNIQUE: Multiplanar, multiecho pulse sequences of the brain and surrounding structures were obtained without intravenous contrast. COMPARISON:  CT head from today. FINDINGS: Brain: No acute infarction, hemorrhage, hydrocephalus, extra-axial collection or mass lesion. Mild T2/FLAIR hyperintensities the white matter nonspecific but compatible with chronic microvascular disease. Vascular: Major arterial flow voids are maintained at the skull base. Skull and upper cervical spine: Normal marrow signal. Sinuses/Orbits: Moderate, predominately ethmoid air cell mucosal thickening. No acute orbital findings. Other: No mastoid effusions. IMPRESSION: No evidence of acute intracranial abnormality. Electronically Signed   By: Feliberto Harts M.D.   On: 05/22/2023  00:42   CT HEAD CODE STROKE WO CONTRAST Result Date: 05/21/2023 CLINICAL DATA:  Code stroke.  Neuro deficit, acute, stroke suspected EXAM: CT HEAD WITHOUT CONTRAST TECHNIQUE: Contiguous axial images were obtained from the base of the skull through the vertex without intravenous contrast. RADIATION DOSE REDUCTION: This exam was performed according to the departmental dose-optimization program which includes automated exposure control, adjustment of the mA and/or kV according to patient size and/or use of iterative reconstruction technique. COMPARISON:  CT head and MRI head July 27, 2022. FINDINGS: Brain: No evidence of acute large vascular territory infarction, hemorrhage, hydrocephalus, extra-axial collection or mass lesion/mass effect. Vascular: No hyperdense vessel identified. Skull: No acute fracture. Sinuses/Orbits: Opacification of multiple ethmoid air cells. No acute orbital findings. Other: No mastoid effusions. ASPECTS [facility] Stroke Program Early CT Score) total score (0-10 with 10 being normal): 10. IMPRESSION: 1. No evidence of acute intracranial abnormality. 2. ASPECTS is 10. Code stroke imaging results were communicated on 05/21/2023 at 9:36 pm to provider Dr. Durwin Nora via telephone, who verbally acknowledged these results. Electronically Signed   By: Feliberto Harts M.D.   On: 05/21/2023 21:37    EKG: I independently viewed the EKG done and my findings are as followed: Normal sinus rhythm at a rate of 82 bpm  Assessment/Plan Present on Admission:  Essential hypertension  Principal Problem:   Acute ischemic stroke [facility]) Active Problems:   Essential hypertension   Mixed hyperlipidemia  Acute ischemic stroke Patient will be admitted to telemetry unit  Echocardiogram in the morning MRI of brain without contrast in the morning Loading dose of Plavix 300 mg x 1 per teleneurology's recommendation was given Continue aspirin, Plavix and statin Continue fall precautions and NIHSS  checks Lipid panel and hemoglobin A1c will be checked Continue PT/OT/SLP eval and treat Bedside swallow eval by nursing prior to diet Tele-neurology will be consulted in the morning and we shall await further recommendations  Essential hypertension  Antihypertensives PRN if Blood pressure is greater than 220/120 or there is a concern for End organ damage/contraindications for permissive HTN. If blood pressure is greater than 220/120 give labetalol PO or IV or Vasotec IV with a goal of 15% reduction in BP during the first  24 hours.  Mixed hyperlipidemia Continue Crestor 20 mg p.o. daily  DVT prophylaxis: SCDs  Code Status: Full code  Family Communication: Wife at bedside (all questions answered to satisfaction)  Consults: Neurology   Severity of Illness: The appropriate patient status for this patient is OBSERVATION. Observation status is judged to be reasonable and necessary in order to provide the required intensity of service to ensure the patient's safety. The patient's presenting symptoms, physical exam findings, and initial radiographic and laboratory data in the context of their medical condition is felt to place them at decreased risk for further clinical deterioration. Furthermore, it is anticipated that the patient will be medically stable for discharge from the hospital within 2 midnights of admission.   Author: Frankey Shown, DO 05/22/2023 12:55 AM  For on call review [email or web address].

## 2023-05-21 NOTE — ED Notes (Signed)
 EDP to triage to evaluate pt.

## 2023-05-21 NOTE — ED Notes (Signed)
 Patient transported to MRI

## 2023-05-21 NOTE — ED Provider Notes (Signed)
[place] EMERGENCY DEPARTMENT AT [facility] Provider Note   CSN: [id number] Arrival date & time: 05/21/23  2111  An emergency department physician performed an initial assessment on this suspected stroke patient at 2121.  History  Chief Complaint  Patient presents with   Code Stroke    Shannon Wilson is a 53 y.o. male.  HPI Patient presents for strokelike symptoms.  Medical history includes migraine headaches, CAD, CVA, HTN, HLD, GERD.  He states that he had a prior stroke several years ago.  He states that he has had some very mild left-sided weakness since that stroke.  He states that he was prescribed aspirin only following his prior stroke.  He takes 81 mg aspirin on some days.  Patient states that he was in his normal state of health earlier today.  He did take his morning blood pressure medications.  At 7 PM, he had onset of facial numbness, slurred speech, and left-sided weakness.  Symptoms have been persistent, but mildly improved since onset.    Home Medications Prior to Admission medications   Medication Sig Start Date End Date Taking? Authorizing Provider  amLODipine (NORVASC) 10 MG tablet Take 1 tablet (10 mg total) by mouth daily. 11/03/18   Terrilee Files, MD  aspirin EC 81 MG tablet Take 1 tablet (81 mg total) by mouth 2 (two) times a week. 10/18/20   Lucretia Roers, MD  baclofen (LIORESAL) 10 MG tablet Take 10 mg by mouth 3 (three) times daily as needed for muscle spasms.    [provider]  cephALEXin (KEFLEX) 500 MG capsule Take 1 capsule (500 mg total) by mouth 4 (four) times daily. 05/03/23   Barrett, Horald Chestnut, PA-C  gabapentin (NEURONTIN) 300 MG capsule Take 300 mg by mouth 3 (three) times daily as needed (nerve pain).    [provider]  ibuprofen (ADVIL) 600 MG tablet Take 1 tablet (600 mg total) by mouth every 8 (eight) hours as needed. 07/27/22   Derwood Kaplan, MD  lisinopril (ZESTRIL) 40 MG tablet Take 40 mg by mouth daily.     [provider]  meloxicam (MOBIC) 15 MG tablet TAKE 1 TABLET BY MOUTH ONCE DAILY *REFILL REQUEST*    [provider]  mesalamine (LIALDA) 1.2 g EC tablet Take 4 tablets (4.8 g total) by mouth daily with breakfast. Take 4 tablets daily for 2 months then decrease to 2 tablets daily. Patient not taking: Reported on 08/24/2022 06/19/22 06/19/23  Lanelle Bal, DO  mupirocin ointment (BACTROBAN) 2 % Apply 1 Application topically 2 (two) times daily. 02/24/23   Eber Hong, MD  naproxen (NAPROSYN) 500 MG tablet Take 1 tablet (500 mg total) by mouth 2 (two) times daily with a meal. 02/24/23   Eber Hong, MD  rosuvastatin (CRESTOR) 20 MG tablet Take 20 mg by mouth at bedtime. 07/21/20   [provider]      Allergies    Hydrocodone-acetaminophen and Triamcinolone    Review of Systems   Review of Systems  Neurological:  Positive for facial asymmetry, speech difficulty, weakness and numbness.  All other systems reviewed and are negative.   Physical Exam Updated Vital Signs BP (!) 154/93   Pulse (!) 58   Temp 98 F (36.7 C) (Oral)   Resp 18   Ht 5\' 11"  (1.803 m)   Wt 89.4 kg   SpO2 96%   BMI 27.48 kg/m  Physical Exam Vitals and nursing note reviewed.  Constitutional:  General: He is not in acute distress.    Appearance: Normal appearance. He is well-developed. He is not ill-appearing, toxic-appearing or diaphoretic.  HENT:     Head: Normocephalic and atraumatic.     Right Ear: External ear normal.     Left Ear: External ear normal.     Nose: Nose normal.     Mouth/Throat:     Mouth: Mucous membranes are moist.  Eyes:     Extraocular Movements: Extraocular movements intact.     Conjunctiva/sclera: Conjunctivae normal.  Cardiovascular:     Rate and Rhythm: Normal rate and regular rhythm.  Pulmonary:     Effort: Pulmonary effort is normal. No respiratory distress.  Abdominal:     General: There is no distension.     Palpations: Abdomen is  soft.  Musculoskeletal:        General: No swelling or deformity.     Cervical back: Normal range of motion and neck supple.  Skin:    General: Skin is warm and dry.     Capillary Refill: Capillary refill takes less than 2 seconds.     Coloration: Skin is not jaundiced or pale.  Neurological:     Mental Status: He is alert and oriented to person, place, and time.     Cranial Nerves: Facial asymmetry present. No dysarthria.     Sensory: Sensory deficit (Left upper extremity diminished) present.     Motor: Weakness (Slight weakness to left arm and leg, when compared to right) present.     Coordination: Coordination is intact. Finger-Nose-Finger Test normal.  Psychiatric:        Mood and Affect: Mood normal.     ED Results / Procedures / Treatments   Labs (all labs ordered are listed, but only abnormal results are displayed) Labs Reviewed  DIFFERENTIAL - Abnormal; Notable for the following components:      Result Value   Eosinophils Absolute 1.4 (*)    All other components within normal limits  COMPREHENSIVE METABOLIC PANEL - Abnormal; Notable for the following components:   Glucose, Bld 141 (*)    Creatinine, Ser 1.30 (*)    All other components within normal limits  CBG MONITORING, ED - Abnormal; Notable for the following components:   Glucose-Capillary 127 (*)    All other components within normal limits  PROTIME-INR  APTT  CBC  ETHANOL  HIV ANTIBODY (ROUTINE TESTING W REFLEX)  LIPID PANEL  HEMOGLOBIN A1C  COMPREHENSIVE METABOLIC PANEL  CBC  MAGNESIUM  PHOSPHORUS  CBG MONITORING, ED  I-STAT CHEM 8, ED    EKG None  Radiology MR BRAIN WO CONTRAST Result Date: 05/22/2023 CLINICAL DATA:  Neuro deficit, acute, stroke suspected. EXAM: MRI HEAD WITHOUT CONTRAST TECHNIQUE: Multiplanar, multiecho pulse sequences of the brain and surrounding structures were obtained without intravenous contrast. COMPARISON:  CT head from today. FINDINGS: Brain: No acute infarction,  hemorrhage, hydrocephalus, extra-axial collection or mass lesion. Mild T2/FLAIR hyperintensities the white matter nonspecific but compatible with chronic microvascular disease. Vascular: Major arterial flow voids are maintained at the skull base. Skull and upper cervical spine: Normal marrow signal. Sinuses/Orbits: Moderate, predominately ethmoid air cell mucosal thickening. No acute orbital findings. Other: No mastoid effusions. IMPRESSION: No evidence of acute intracranial abnormality. Electronically Signed   By: Feliberto Harts M.D.   On: 05/22/2023 00:42   CT HEAD CODE STROKE WO CONTRAST Result Date: 05/21/2023 CLINICAL DATA:  Code stroke.  Neuro deficit, acute, stroke suspected EXAM: CT HEAD WITHOUT CONTRAST TECHNIQUE: Contiguous axial  images were obtained from the base of the skull through the vertex without intravenous contrast. RADIATION DOSE REDUCTION: This exam was performed according to the departmental dose-optimization program which includes automated exposure control, adjustment of the mA and/or kV according to patient size and/or use of iterative reconstruction technique. COMPARISON:  CT head and MRI head July 27, 2022. FINDINGS: Brain: No evidence of acute large vascular territory infarction, hemorrhage, hydrocephalus, extra-axial collection or mass lesion/mass effect. Vascular: No hyperdense vessel identified. Skull: No acute fracture. Sinuses/Orbits: Opacification of multiple ethmoid air cells. No acute orbital findings. Other: No mastoid effusions. ASPECTS [facility] Stroke Program Early CT Score) total score (0-10 with 10 being normal): 10. IMPRESSION: 1. No evidence of acute intracranial abnormality. 2. ASPECTS is 10. Code stroke imaging results were communicated on 05/21/2023 at 9:36 pm to provider Dr. Durwin Nora via telephone, who verbally acknowledged these results. Electronically Signed   By: Feliberto Harts M.D.   On: 05/21/2023 21:37    Procedures Procedures    Medications Ordered  in ED Medications   stroke: early stages of recovery book (has no administration in time range)  acetaminophen (TYLENOL) tablet 650 mg (has no administration in time range)    Or  acetaminophen (TYLENOL) 160 MG/5ML solution 650 mg (has no administration in time range)    Or  acetaminophen (TYLENOL) suppository 650 mg (has no administration in time range)  senna-docusate (Senokot-S) tablet 1 tablet (has no administration in time range)  clopidogrel (PLAVIX) tablet 75 mg (has no administration in time range)  sodium chloride flush (NS) 0.9 % injection 3 mL (3 mLs Intravenous Given 05/21/23 2132)  clopidogrel (PLAVIX) tablet 300 mg (300 mg Oral Given 05/21/23 2158)  aspirin tablet 325 mg (325 mg Oral Given 05/21/23 2158)    ED Course/ Medical Decision Making/ A&P                                 Medical Decision Making Amount and/or Complexity of Data Reviewed Labs: ordered. Radiology: ordered.  Risk OTC drugs. Prescription drug management. Decision regarding hospitalization.   This patient presents to the ED for concern of strokelike symptoms, this involves an extensive number of treatment options, and is a complaint that carries with it a high risk of complications and morbidity.  The differential diagnosis includes CVA, complex migraine, recrudescence of prior stroke, hypertensive emergency, metabolic derangements   Co morbidities that complicate the patient evaluation  migraine headaches, CAD, CVA, HTN, HLD, GERD   Additional history obtained:  Additional history obtained from N/A External records from outside source obtained and reviewed including EMR   Lab Tests:  I Ordered, and personally interpreted labs.  The pertinent results include: Normal hemoglobin, no leukocytosis, baseline creatinine, normal electrolytes   Imaging Studies ordered:  I ordered imaging studies including CT head, MRI brain I independently visualized and interpreted imaging which showed no  acute findings I agree with the radiologist interpretation   Cardiac Monitoring: / EKG:  The patient was maintained on a cardiac monitor.  I personally viewed and interpreted the cardiac monitored which showed an underlying rhythm of: Sinus rhythm   Consultations Obtained:  I requested consultation with the neurologist, Dr. Duanne Limerick,  and discussed lab and imaging findings as well as pertinent plan - they recommend: Admission for stroke workup.  If MRI is negative, treat for hypertensive urgency   Problem List / ED Course / Critical interventions / Medication management  Patient  presents for strokelike symptoms.  Onset was 2 hours prior to arrival.  On arrival, he has mild facial asymmetry and mild left-sided weakness.  He endorses diminished in station left upper extremity.  Speech appears normal at this time.  CBG was in the range of 120.  Current blood pressure elevated in the range of 210 SBP.  Given timeline, code stroke was initiated.  Patient underwent noncontrasted CT scan of head which did not show acute findings.  I spoke with neurologist who recommended admission for stroke workup.  If MRI negative, patient to be treated for hypertensive urgency.  Neurology recommends load of aspirin and Plavix.  This was ordered.  Patient was admitted for further management. I ordered medication including aspirin and Plavix for concern of CVA Reevaluation of the patient after these medicines showed that the patient stayed the same I have reviewed the patients home medicines and have made adjustments as needed   Social Determinants of Health:  Lives independently        Final Clinical Impression(s) / ED Diagnoses Final diagnoses:  Stroke-like symptoms    Rx / DC Orders ED Discharge Orders     None         Gloris Manchester, MD 05/22/23 [phone]

## 2023-05-21 NOTE — Progress Notes (Signed)
 Code stroke activated at 2119. Patient sent to CT at this time Per RN patient having facial numbness and left sided weakness. Returned from CT at 2125. Per patient mRs is 0 states he had a stroke in 2019 with similar symptoms his started at 1900 tonight with left sided facial numbness that is now moved to the right along with some decreased sensation on the left side of his body.   Telespecialist paged at 2126. Dr. Bryon Caraway on camera for report and neuro eval at 2133. CT read given to [facility] at 2140. TNK talk at 2138. Patient denied TNK at 2141. Off camera at 2145.

## 2023-05-21 NOTE — Consult Note (Signed)
 [facility] Tele[facility] Consult Services   Patient Name:   Shannon Wilson, Shannon Wilson Date of Birth:   Jun 23, 1970 Identification Number:   MRN - [id number] Date of Service:   05/21/2023 03:47:42  Diagnosis:       I63.89 - Cerebrovascular accident (CVA) due to other mechanism [facility])  Impression:      Mr. Chowning has similar symptoms in the past that have resolved and he elected not to take thrombolytics because he finds the deficits non-disabling. Rather he would like to take DAPT and further evaluate the etiology with a MRI brain. If there is no infarct on MRI brain then more aggressive BP lowering to goal <130/90. He had a normal CTA head and neck in April of 2024; his current symptoms are not large vessel but rather small vessel in etiology. I recommend atorvastatin  40mg  for goal LDL of <70. A1C goal is <7. If there is an infarct on MRI then further investigation for PFO, hypercoagulable state, vasculitis could be pursued.  Our recommendations are outlined below.  Recommendations:        Stroke/Telemetry Floor       Neuro Checks       Bedside Swallow Eval       DVT Prophylaxis       IV Fluids, Normal Saline       Head of Bed 30 Degrees       Euglycemia and Avoid Hyperthermia (PRN Acetaminophen )       Bolus with Clopidogrel  300 mg bolus x1 and initiate dual antiplatelet therapy with Aspirin  81 mg daily and Clopidogrel  75 mg daily  Sign Out:       Discussed with Emergency Department Provider    ------------------------------------------------------------------------------  Advanced Imaging: Advanced Imaging Deferred because:  Non-disabling symptoms as verified by the patient; no cortical signs so not consistent with LVO   Metrics: Last Known Well: 05/21/2023 19:00:00 Dispatch Time: 05/21/2023 59:56:38 Arrival Time: 05/21/2023 21:11:00 Initial Response Time: 05/21/2023 21:32:33 Symptoms: left facial numbness. Initial patient interaction: 05/21/2023 21:32:33 NIHSS  Assessment Completed: 05/21/2023 21:43:57 Patient is not a candidate for Thrombolytic. Thrombolytic Medical Decision: 05/21/2023 21:43:58 Patient was not deemed candidate for Thrombolytic because of following reasons: Stroke severity too mild (non-disabling) .  CT head showed no acute hemorrhage or acute core infarct. I personally Reviewed the CT Head and it Showed no ICH  Primary Provider Notified of Diagnostic Impression and Management Plan on: 05/21/2023 21:49:22    ------------------------------------------------------------------------------  History of Present Illness: Patient is a 53 year old Male.  Patient was brought by private transportation with symptoms of left facial numbness. 53 year old man with a history of TIA/stroke in the past of similar symptoms involving left face and arm numbness. Reviewing his records MRI brain in 2018 and 07/27/2022 were obtained for the same symptoms. He is not taking aspirin  at home. Today he had a salty meal, but otherwise unclear why he has return of these symptoms. He recently was on antibiotics for hand wound, but has not felt ill.   Past Medical History:      Hypertension      Hyperlipidemia      Coronary Artery Disease      Stroke Other PMH:  back pain after accident  Medications:  No Anticoagulant use  No Antiplatelet use Reviewed EMR for current medications  Allergies:  Reviewed  Social History: Patient Is: Married Drug Use: No  Family History:  There is no family history of premature cerebrovascular disease pertinent to this consultation  ROS :  14 Points Review of Systems was performed and was negative except mentioned in HPI.  Past Surgical History: There Is No Surgical History Contributory To Today's Visit There Is Surgical History of:  spine surgery 2014    Examination: BP(188/108), Pulse(80), Blood Glucose(127) 1A: Level of Consciousness - Alert; keenly responsive + 0 1B: Ask Month and Age - Both  Questions Right + 0 1C: Blink Eyes & Squeeze Hands - Performs Both Tasks + 0 2: Test Horizontal Extraocular Movements - Normal + 0 3: Test Visual Fields - No Visual Loss + 0 4: Test Facial Palsy (Use Grimace if Obtunded) - Normal symmetry + 0 5A: Test Left Arm Motor Drift - No Drift for 10 Seconds + 0 5B: Test Right Arm Motor Drift - No Drift for 10 Seconds + 0 6A: Test Left Leg Motor Drift - No Drift for 5 Seconds + 0 6B: Test Right Leg Motor Drift - No Drift for 5 Seconds + 0 7: Test Limb Ataxia (FNF/Heel-Shin) - No Ataxia + 0 8: Test Sensation - Mild-Moderate Loss: Less Sharp/More Dull + 1 9: Test Language/Aphasia - Normal; No aphasia + 0 10: Test Dysarthria - Normal + 0 11: Test Extinction/Inattention - No abnormality + 0  NIHSS Score: 1   Pre-Morbid Modified Rankin Scale: 0 Points = No symptoms at all  Spoke with : Dr Kermit Ped  This consult was conducted in real time using interactive audio and Immunologist. Patient was informed of the technology being used for this visit and agreed to proceed. Patient located in hospital and provider located at home/office setting.   Patient is being evaluated for possible acute neurologic impairment and high probability of imminent or life-threatening deterioration. I spent total of 45 minutes providing care to this patient, including time for face to face visit via telemedicine, review of medical records, imaging studies and discussion of findings with providers, the patient and/or family.   Dr Mervyn Ace   [facility] For Inpatient follow-up with [facility] physician please call [facility] at [phone]. As we are not an outpatient service for any post hospital discharge needs please contact the hospital for assistance. If you have any questions for the [facility] physicians or need to reconsult for clinical or diagnostic changes please contact us  via [facility] at [phone].

## 2023-05-21 NOTE — ED Notes (Signed)
 Pt back from MRI

## 2023-05-21 NOTE — ED Notes (Signed)
 SPOK paged @ 2122

## 2023-05-21 NOTE — ED Notes (Signed)
 Delayed Q2H NIH due to patient being off the unit at time for re-eval. Will obtain upon arrival back from MRI.

## 2023-05-21 NOTE — ED Notes (Signed)
 Pt transported to CT ?

## 2023-05-22 ENCOUNTER — Encounter (HOSPITAL_COMMUNITY): Payer: Self-pay | Admitting: Internal Medicine

## 2023-05-22 ENCOUNTER — Observation Stay (HOSPITAL_BASED_OUTPATIENT_CLINIC_OR_DEPARTMENT_OTHER): Payer: Medicare Other

## 2023-05-22 ENCOUNTER — Other Ambulatory Visit (HOSPITAL_COMMUNITY): Payer: Self-pay | Admitting: *Deleted

## 2023-05-22 DIAGNOSIS — G459 Transient cerebral ischemic attack, unspecified: Secondary | ICD-10-CM

## 2023-05-22 DIAGNOSIS — E782 Mixed hyperlipidemia: Secondary | ICD-10-CM | POA: Insufficient documentation

## 2023-05-22 DIAGNOSIS — I639 Cerebral infarction, unspecified: Secondary | ICD-10-CM

## 2023-05-22 DIAGNOSIS — I10 Essential (primary) hypertension: Secondary | ICD-10-CM

## 2023-05-22 LAB — COMPREHENSIVE METABOLIC PANEL
ALT: 18 U/L (ref 0–44)
AST: 17 U/L (ref 15–41)
Albumin: 3.7 g/dL (ref 3.5–5.0)
Alkaline Phosphatase: 82 U/L (ref 38–126)
Anion gap: 5 (ref 5–15)
BUN: 18 mg/dL (ref 6–20)
CO2: 27 mmol/L (ref 22–32)
Calcium: 8.8 mg/dL — ABNORMAL LOW (ref 8.9–10.3)
Chloride: 108 mmol/L (ref 98–111)
Creatinine, Ser: 1.25 mg/dL — ABNORMAL HIGH (ref 0.61–1.24)
GFR, Estimated: >60 mL/min (ref >60)
Glucose, Bld: 111 mg/dL — ABNORMAL HIGH (ref 70–99)
Potassium: 3.5 mmol/L (ref 3.5–5.1)
Sodium: 140 mmol/L (ref 135–145)
Total Bilirubin: 0.4 mg/dL (ref 0.0–1.2)
Total Protein: 6.7 g/dL (ref 6.5–8.1)

## 2023-05-22 LAB — LIPID PANEL
Cholesterol: 148 mg/dL (ref 0–200)
HDL: 33 mg/dL — ABNORMAL LOW (ref >40)
LDL Cholesterol: 94 mg/dL (ref 0–99)
Total CHOL/HDL Ratio: 4.5 {ratio}
Triglycerides: 103 mg/dL (ref <150)
VLDL: 21 mg/dL (ref 0–40)

## 2023-05-22 LAB — PHOSPHORUS: Phosphorus: 3.1 mg/dL (ref 2.5–4.6)

## 2023-05-22 LAB — ECHOCARDIOGRAM COMPLETE
Area-P 1/2: 2.91 cm2
Height: 71 in
S' Lateral: 2.2 cm
Weight: 3058.22 [oz_av]

## 2023-05-22 LAB — HEMOGLOBIN A1C
Hgb A1c MFr Bld: 5.7 % — ABNORMAL HIGH (ref 4.8–5.6)
Mean Plasma Glucose: 116.89 mg/dL

## 2023-05-22 LAB — CBC
HCT: 38.7 % — ABNORMAL LOW (ref 39.0–52.0)
Hemoglobin: 13.1 g/dL (ref 13.0–17.0)
MCH: 29.8 pg (ref 26.0–34.0)
MCHC: 33.9 g/dL (ref 30.0–36.0)
MCV: 88.2 fL (ref 80.0–100.0)
Platelets: 275 10*3/uL (ref 150–400)
RBC: 4.39 MIL/uL (ref 4.22–5.81)
RDW: 12.7 % (ref 11.5–15.5)
WBC: 8.8 10*3/uL (ref 4.0–10.5)
nRBC: 0 % (ref 0.0–0.2)

## 2023-05-22 LAB — MAGNESIUM: Magnesium: 2.2 mg/dL (ref 1.7–2.4)

## 2023-05-22 LAB — HIV ANTIBODY (ROUTINE TESTING W REFLEX): HIV Screen 4th Generation wRfx: NONREACTIVE

## 2023-05-22 MED ORDER — ACETAMINOPHEN 160 MG/5ML PO SOLN: 650.0000 mg | ORAL | Status: DC | PRN

## 2023-05-22 MED ORDER — CLOPIDOGREL BISULFATE 75 MG PO TABS: 75.0000 mg | ORAL_TABLET | Freq: Every day | ORAL | 0 refills | Status: AC

## 2023-05-22 MED ORDER — ASPIRIN 81 MG PO TBEC
81.0000 mg | DELAYED_RELEASE_TABLET | Freq: Every day | ORAL | Status: DC
  Administered 2023-05-22: 81 mg via ORAL
  Filled 2023-05-22: qty 1

## 2023-05-22 MED ORDER — ATORVASTATIN CALCIUM 80 MG PO TABS: 80.0000 mg | ORAL_TABLET | Freq: Every day | ORAL | 3 refills | Status: AC

## 2023-05-22 MED ORDER — ACETAMINOPHEN 650 MG RE SUPP: 650.0000 mg | RECTAL | Status: DC | PRN

## 2023-05-22 MED ORDER — ASPIRIN EC 81 MG PO TBEC: 81.0000 mg | DELAYED_RELEASE_TABLET | Freq: Every day | ORAL | Status: AC

## 2023-05-22 MED ORDER — ROSUVASTATIN CALCIUM 20 MG PO TABS: 20.0000 mg | ORAL_TABLET | Freq: Every day | ORAL | Status: DC

## 2023-05-22 MED ORDER — LISINOPRIL 10 MG PO TABS
40.0000 mg | ORAL_TABLET | Freq: Every day | ORAL | Status: DC
Start: 2023-05-22 — End: 2023-05-23
  Administered 2023-05-22: 40 mg via ORAL
  Filled 2023-05-22: qty 4

## 2023-05-22 MED ORDER — SENNOSIDES-DOCUSATE SODIUM 8.6-50 MG PO TABS: 1.0000 | ORAL_TABLET | Freq: Every evening | ORAL | Status: DC | PRN

## 2023-05-22 MED ORDER — AMLODIPINE BESYLATE 5 MG PO TABS
10.0000 mg | ORAL_TABLET | Freq: Every day | ORAL | Status: DC
  Administered 2023-05-22: 10 mg via ORAL
  Filled 2023-05-22: qty 2

## 2023-05-22 MED ORDER — ATORVASTATIN CALCIUM 40 MG PO TABS: 40.0000 mg | ORAL_TABLET | Freq: Every day | ORAL | Status: DC

## 2023-05-22 MED ORDER — CLOPIDOGREL BISULFATE 75 MG PO TABS
75.0000 mg | ORAL_TABLET | Freq: Every day | ORAL | Status: DC
  Administered 2023-05-22: 75 mg via ORAL
  Filled 2023-05-22: qty 1

## 2023-05-22 MED ORDER — ASPIRIN 81 MG PO TBEC: 81.0000 mg | DELAYED_RELEASE_TABLET | ORAL | Status: DC

## 2023-05-22 MED ORDER — ACETAMINOPHEN 325 MG PO TABS: 650.0000 mg | ORAL_TABLET | ORAL | Status: DC | PRN

## 2023-05-22 MED ORDER — STROKE: EARLY STAGES OF RECOVERY BOOK
Freq: Once | Status: DC
  Filled 2023-05-22: qty 1

## 2023-05-22 NOTE — ED Notes (Signed)
Pt and family member at bedside given warm blankets per their request

## 2023-05-22 NOTE — Plan of Care (Signed)
  Problem: Education: Goal: Knowledge of disease or condition will improve Outcome: Progressing   Problem: Coping: Goal: Will verbalize positive feelings about self Outcome: Progressing   Problem: Education: Goal: Knowledge of General Education information will improve Description: Including pain rating scale, medication(s)/side effects and non-pharmacologic comfort measures Outcome: Progressing

## 2023-05-22 NOTE — Progress Notes (Signed)
*  PRELIMINARY RESULTS* Echocardiogram 2D Echocardiogram has been performed.  Stacey Drain 05/22/2023, 5:20 PM

## 2023-05-22 NOTE — TOC CM/SW Note (Signed)
Transition of Care [facility]) - Inpatient Brief Assessment   Patient Details  Name: Shannon Wilson MRN: [id number] Date of Birth: May 30, 1970  Transition of [facility] CM/SW Contact:    Isabella Bowens, LCSWA Phone Number: 05/22/2023, 10:26 AM   Clinical Narrative: Transition of Care Department [facility]) has reviewed patient and no TOC needs have been identified at this time. We will continue to monitor patient advancement through interdisciplinary progression rounds. If new patient transition needs arise, please place a TOC consult.  Transition of Care Asessment: Insurance and Status: Insurance coverage has been reviewed Patient has primary care physician: Yes Home environment has been reviewed: Single Family Home Prior level of function:: Independent Prior/Current Home Services: No current home services Social Drivers of Health Review: SDOH reviewed no interventions necessary Readmission risk has been reviewed: Yes Transition of care needs: no transition of care needs at this time

## 2023-05-22 NOTE — Plan of Care (Signed)
  Problem: Education: Goal: Knowledge of disease or condition will improve 05/22/2023 1650 by Koree Staheli, Jeronimo Greaves, RN Outcome: Progressing 05/22/2023 1550 by Wannetta Sender, RN Outcome: Progressing Goal: Knowledge of secondary prevention will improve (MUST DOCUMENT ALL) Outcome: Progressing   Problem: Ischemic Stroke/TIA Tissue Perfusion: Goal: Complications of ischemic stroke/TIA will be minimized Outcome: Progressing   Problem: Coping: Goal: Will verbalize positive feelings about self Outcome: Progressing   Problem: Education: Goal: Knowledge of General Education information will improve Description: Including pain rating scale, medication(s)/side effects and non-pharmacologic comfort measures Outcome: Progressing

## 2023-05-22 NOTE — Care Management Obs Status (Signed)
MEDICARE OBSERVATION STATUS NOTIFICATION   Patient Details  Name: Shannon Wilson MRN: [id number] Date of Birth: [birth year]   Medicare Observation Status Notification Given:  Yes    Corey [name] 05/22/2023, 5:03 PM

## 2023-05-22 NOTE — Evaluation (Signed)
Occupational Therapy Evaluation Patient Details Name: Shannon Wilson MRN: [id number] DOB: 04-14-1970 Today's Date: 05/22/2023   History of Present Illness   History of Present Illness: Shannon Wilson is a 53 y.o. male with medical history significant of hypertension, hyperlipidemia, prior stroke with mild left-sided weakness who presents to the emergency department due to sudden onset of facial numbness and left-sided weakness which occurred around 7 PM while at rest.  There has been a slight improvement in his symptoms since onset, however, symptoms have not resolved.  He was supposed to be taking aspirin 81 mg orally daily, but he has not been consistently compliant with this. (per DO)     Clinical Impressions Pt agreeable to OT and PT co-evaluation. Pt reports his symptoms have resolved. Pt appears to be at or near baseline function for mobility and ADL's. No assist needed for ambulation or donning of shoes. WFL B UE strength and coordination. Pt is not recommended for further acute OT services and will be discharged to care of nursing staff for remaining length of stay.               Functional Status Assessment   Patient has not had a recent decline in their functional status     Equipment Recommendations   None recommended by OT      Precautions/Restrictions   Precautions Precautions: None Restrictions Weight Bearing Restrictions Per Provider Order: No     Mobility Bed Mobility Overal bed mobility: Independent                  Transfers Overall transfer level: Independent                        Balance Overall balance assessment: Independent                                         ADL either performed or assessed with clinical judgement   ADL Overall ADL's : Independent                                       General ADL Comments: No assist for donning shoes at EOB.     Vision Baseline Vision/History: 1  Wears glasses Ability to See in Adequate Light: 1 Impaired Patient Visual Report: No change from baseline Vision Assessment?: No apparent visual deficits;Wears glasses for reading     Perception Perception: Not tested       Praxis Praxis: Not tested       Pertinent Vitals/Pain Pain Assessment Pain Assessment: Faces Faces Pain Scale: Hurts a little bit Pain Location: chronic back pain Pain Descriptors / Indicators: Discomfort Pain Intervention(s): Monitored during session     Extremity/Trunk Assessment Upper Extremity Assessment Upper Extremity Assessment: Overall WFL for tasks assessed   Lower Extremity Assessment Lower Extremity Assessment: Defer to PT evaluation   Cervical / Trunk Assessment Cervical / Trunk Assessment: Normal   Communication Communication Communication: No apparent difficulties   Cognition Arousal: Alert Behavior During Therapy: WFL for tasks assessed/performed Cognition: No apparent impairments             OT - Cognition Comments: WFL                 Following commands: Intact  Cueing    Cueing Techniques: Verbal cues                 Home Living Family/patient expects to be discharged to:: Private residence Living Arrangements: Spouse/significant other Available Help at Discharge: Family;Available PRN/intermittently Type of Home: House Home Access: Stairs to enter [employer] of Steps: 3 Entrance Stairs-Rails: Right;Can reach both;Left Home Layout: Two level;Able to live on main level with bedroom/bathroom     Bathroom Shower/Tub: Producer, television/film/video: Standard Bathroom Accessibility: Yes How Accessible: Accessible via wheelchair;Accessible via walker Home Equipment: Other (comment) (walking stick)          Prior Functioning/Environment Prior Level of Function : Independent/Modified Independent             Mobility Comments: Community ambulator with PRN use of walking  stick. Drives ADLs Comments: Independent                            Co-evaluation PT/OT/SLP Co-Evaluation/Treatment: Yes Reason for Co-Treatment: To address functional/ADL transfers   OT goals addressed during session: ADL's and self-care      AM-PAC OT "6 Clicks" Daily Activity     Outcome Measure Help from another person eating meals?: None Help from another person taking care of personal grooming?: None Help from another person toileting, which includes using toliet, bedpan, or urinal?: None Help from another person bathing (including washing, rinsing, drying)?: None Help from another person to put on and taking off regular upper body clothing?: None Help from another person to put on and taking off regular lower body clothing?: None 6 Click Score: 24   End of Session    Activity Tolerance: Patient tolerated treatment well Patient left: in bed;with call bell/phone within reach;with family/visitor present  OT Visit Diagnosis: Muscle weakness (generalized) (M62.81);Other symptoms and signs involving the nervous system (R29.898)                Time: 7829-5621 OT Time Calculation (min): 10 min Charges:  OT General Charges $OT Visit: 1 Visit OT Evaluation $OT Eval Low Complexity: 1 Low  Sameena Artus OT, MOT   Danie Chandler 05/22/2023, 10:09 AM

## 2023-05-22 NOTE — Progress Notes (Signed)
SLP Cancellation Note  Patient Details Name: Shannon Wilson MRN: [id number] DOB: 02-08-1971   Cancelled treatment:       Reason Eval/Treat Not Completed: SLP screened, no needs identified, will sign off; SLP screened Pt in room. Pt denies any changes in swallowing, speech, language, or cognition. MRI negative for acute changes. SLE will be deferred at this time. Reconsult if indicated. SLP will sign off.   Thank you,  Havery Moros, CCC-SLP [phone]    Felice Hope 05/22/2023, 5:56 PM

## 2023-05-22 NOTE — Evaluation (Signed)
Physical Therapy Evaluation Patient Details Name: Shannon Wilson MRN: [id number] DOB: 1970/07/31 Today's Date: 05/22/2023  History of Present Illness  Shannon Wilson is a 53 y.o. male with medical history significant of hypertension, hyperlipidemia, prior stroke with mild left-sided weakness who presents to the emergency department due to sudden onset of facial numbness and left-sided weakness which occurred around 7 PM while at rest.  There has been a slight improvement in his symptoms since onset, however, symptoms have not resolved.  He was supposed to be taking aspirin 81 mg orally daily, but he has not been consistently compliant with this. (per DO)   Clinical Impression  Patient was agreeable to therapy. Patient performed all tasks assessed independently. Was left in bed at conclusion of session. Plan:  Patient discharged from physical therapy to care of nursing for ambulation daily as tolerated for length of stay.        If plan is discharge home, recommend the following: A little help with walking and/or transfers;A little help with bathing/dressing/bathroom;Assistance with cooking/housework;Help with stairs or ramp for entrance   Can travel by private vehicle        Equipment Recommendations None recommended by PT  Recommendations for Other Services       Functional Status Assessment Patient has had a recent decline in their functional status and demonstrates the ability to make significant improvements in function in a reasonable and predictable amount of time.     Precautions / Restrictions Precautions Precautions: None Restrictions Weight Bearing Restrictions Per Provider Order: No      Mobility  Bed Mobility Overal bed mobility: Independent                  Transfers Overall transfer level: Independent                      Ambulation/Gait Ambulation/Gait assistance: Independent Gait Distance (Feet): 100 Feet Assistive device: None Gait  Pattern/deviations: WFL(Within Functional Limits) Gait velocity: slow     General Gait Details: slow, wfl  Stairs            Wheelchair Mobility     Tilt Bed    Modified Rankin (Stroke Patients Only)       Balance Overall balance assessment: Independent                                           Pertinent Vitals/Pain Pain Assessment Pain Assessment: Faces Faces Pain Scale: Hurts a little bit Pain Location: chronic back pain Pain Descriptors / Indicators: Discomfort Pain Intervention(s): Monitored during session    Home Living Family/patient expects to be discharged to:: Private residence Living Arrangements: Spouse/significant other Available Help at Discharge: Family;Available PRN/intermittently Type of Home: House Home Access: Stairs to enter Entrance Stairs-Rails: Right;Can reach both;Left Entrance Stairs-Number of Steps: 3   Home Layout: Two level;Able to live on main level with bedroom/bathroom Home Equipment: Other (comment) (walking stick)      Prior Function Prior Level of Function : Independent/Modified Independent             Mobility Comments: Community ambulator with PRN use of walking stick. Drives ADLs Comments: Independent     Extremity/Trunk Assessment   Upper Extremity Assessment Upper Extremity Assessment: Defer to OT evaluation    Lower Extremity Assessment Lower Extremity Assessment: Overall WFL for tasks assessed    Cervical / Trunk Assessment  Cervical / Trunk Assessment: Normal  Communication   Communication Communication: No apparent difficulties    Cognition Arousal: Alert Behavior During Therapy: WFL for tasks assessed/performed                             Following commands: Intact       Cueing Cueing Techniques: Verbal cues     General Comments      Exercises     Assessment/Plan    PT Assessment Patient does not need any further PT services  PT Problem List          PT Treatment Interventions      PT Goals (Current goals can be found in the Care Plan section)  Acute Rehab PT Goals Patient Stated Goal: to return home PT Goal Formulation: With patient/family Time For Goal Achievement: 05/29/23 Potential to Achieve Goals: Good    Frequency       Co-evaluation PT/OT/SLP Co-Evaluation/Treatment: Yes Reason for Co-Treatment: To address functional/ADL transfers PT goals addressed during session: Mobility/safety with mobility OT goals addressed during session: ADL's and self-care       AM-PAC PT "6 Clicks" Mobility  Outcome Measure Help needed turning from your back to your side while in a flat bed without using bedrails?: None Help needed moving from lying on your back to sitting on the side of a flat bed without using bedrails?: None Help needed moving to and from a bed to a chair (including a wheelchair)?: None Help needed standing up from a chair using your arms (e.g., wheelchair or bedside chair)?: None Help needed to walk in hospital room?: None Help needed climbing 3-5 steps with a railing? : A Little 6 Click Score: 23    End of Session   Activity Tolerance: Patient tolerated treatment well Patient left: in bed;with call bell/phone within reach;with family/visitor present Nurse Communication: Mobility status PT Visit Diagnosis: Unsteadiness on feet (R26.81);Other abnormalities of gait and mobility (R26.89);Muscle weakness (generalized) (M62.81)    Time: 1610-9604 PT Time Calculation (min) (ACUTE ONLY): 11 min   Charges:   PT Evaluation $PT Eval Low Complexity: 1 Low PT Treatments $Therapeutic Activity: 8-22 mins PT General Charges $$ ACUTE PT VISIT: 1 Visit         Johnesha Acheampong SPT

## 2023-05-24 NOTE — Discharge Summary (Signed)
Physician Discharge Summary   Patient: Shannon Wilson MRN: [id number] DOB: July 05, 1970  Admit date:     05/21/2023  Discharge date: 05/22/2023  Discharge Physician: Marolyn Haller   PCP: Benita Stabile, MD   Recommendations at discharge:    Stop DAPT after 21 days and continue with aspirin only afterwards   Discharge Diagnoses: Principal Problem:   Acute ischemic stroke [facility]) Active Problems:   Essential hypertension   Mixed hyperlipidemia  Resolved Problems:   * No resolved hospital problems. *  Hospital Course: HPI: Shannon Wilson is a 53 y.o. male with medical history significant of hypertension, hyperlipidemia, prior TIA with presenting symptoms of mild left-sided weakness who presents to the emergency department due to sudden onset of facial numbness and left-sided weakness which occurred around 7 PM while at rest.  There has been a slight improvement in his symptoms since onset, however, symptoms have not resolved.  He was supposed to be taking aspirin 81 mg orally daily, but he has not been consistently compliant with this.   He underwent MRI brain and CT head that showed no acute intracranial abnormality including no acute infarct. Previously haad CTA head and neck 07/2022 that was normal. His Echo showed mild LVH and G1DD, no bubble study was done. Patient had no evidence of atrial fibrillation. He had no diabetes (A1c 5.7), LDL is 94.   Neurology consulted and recommended DAPT and continued cholesterol lowering medicine.   Patient's symptoms completely resolved the night prior to discharge.   Assessment and Plan: Transient ischemic attack Patient's symptoms resolved the morning prior to discharge. Likely due to small vessel disease. He will need DAPT for 21 days and then continue aspirin only afterwards. His LDL is 94. Patient previously on crestor 20mg . Started patient on atorvastatin 80mg  on discharge.    HTN SBP elevated on admission. Patient's home amlodipine and lisinopril  resumed after no evidence of acute stroke. His blood pressure improved.   HLD Discharged on atorvastatin 80mg  daily. May require additional lipid lowering medication if LDL not at goal.   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 1.3. He has hypertension.  Continue  antihypertensives  No need for nephrology at this time      Consultants: Neurology Procedures performed: None  Disposition: Home Diet recommendation:  Regular diet DISCHARGE MEDICATION: Allergies as of 05/22/2023       Reactions   Hydrocodone-acetaminophen Nausea And Vomiting, Other (See Comments)   Triamcinolone Rash   Itching and rash per spouse and pt        Medication List     STOP taking these medications    ibuprofen 600 MG tablet Commonly known as: ADVIL   rosuvastatin 20 MG tablet Commonly known as: CRESTOR       TAKE these medications    amLODipine 10 MG tablet Commonly known as: NORVASC Take 1 tablet (10 mg total) by mouth daily.   aspirin EC 81 MG tablet Take 1 tablet (81 mg total) by mouth daily. What changed: when to take this   atorvastatin 80 MG tablet Commonly known as: Lipitor Take 1 tablet (80 mg total) by mouth daily.   baclofen 10 MG tablet Commonly known as: LIORESAL Take 10 mg by mouth 3 (three) times daily as needed for muscle spasms.   clopidogrel 75 MG tablet Commonly known as: PLAVIX Take 1 tablet (75 mg total) by mouth daily for 21 days.   gabapentin 300 MG capsule Commonly known as: NEURONTIN Take 300 mg  by mouth 3 (three) times daily as needed (nerve pain).   lisinopril 40 MG tablet Commonly known as: ZESTRIL Take 40 mg by mouth daily.   meloxicam 15 MG tablet Commonly known as: MOBIC TAKE 1 TABLET BY MOUTH ONCE DAILY *REFILL REQUEST*        Discharge Exam: Filed Weights   05/21/23 2117 05/22/23 1403  Weight: 89.4 kg 86.7 kg   Physical Exam  Constitutional: In no distress.  Cardiovascular: Normal rate, regular rhythm. No lower  extremity edema  Pulmonary: Non labored breathing on room air, no wheezing or rales.   Abdominal: Soft. Normal bowel sounds. Non distended and non tender Musculoskeletal: Normal range of motion.     Neurological: Alert and oriented to person, place, and time. Non focal  Skin: Skin is warm and dry.    Condition at discharge: good  The results of significant diagnostics from this hospitalization (including imaging, microbiology, ancillary and laboratory) are listed below for reference.   Imaging Studies: ECHOCARDIOGRAM COMPLETE Result Date: 05/22/2023    ECHOCARDIOGRAM REPORT   Patient Name:   Shannon Wilson Date of Exam: 05/22/2023 Medical Rec #:  [id number]   Height:       71.0 in Accession #:    [id number]  Weight:       191.1 lb Date of Birth:  1970/06/12   BSA:          2.068 m Patient Age:    52 years    BP:           144/97 mmHg Patient Gender: M           HR:           69 bpm. [facility] Procedure: 2D Echo, Cardiac Doppler and Color Doppler Indications:    Stroke l63.9  History:        Patient has prior history of Echocardiogram examinations, most                 recent 12/25/2016. Previous Myocardial Infarction and CAD, TIA;                 Risk Factors:Hypertension.  Sonographer:    Celesta Gentile RCS Referring Phys: [id number] OLADAPO ADEFESO IMPRESSIONS  1. Left ventricular ejection fraction, by estimation, is 60 to 65%. The left ventricle has normal function. The left ventricle has no regional wall motion abnormalities. There is mild left ventricular hypertrophy. Left ventricular diastolic parameters are consistent with Grade I diastolic dysfunction (impaired relaxation).  2. Right ventricular systolic function is normal. The right ventricular size is normal. There is normal pulmonary artery systolic pressure.  3. The mitral valve is normal in structure. No evidence of mitral valve regurgitation. No evidence of mitral stenosis.  4. The aortic valve is tricuspid. Aortic valve  regurgitation is not visualized. No aortic stenosis is present.  5. The inferior vena cava is normal in size with greater than 50% respiratory variability, suggesting right atrial pressure of 3 mmHg. Comparison(s): No prior Echocardiogram. FINDINGS  Left Ventricle: Left ventricular ejection fraction, by estimation, is 60 to 65%. The left ventricle has normal function. The left ventricle has no regional wall motion abnormalities. The left ventricular internal cavity size was normal in size. There is  mild left ventricular hypertrophy. Left ventricular diastolic parameters are consistent with Grade I diastolic dysfunction (impaired relaxation). Normal left ventricular filling pressure. Right Ventricle: The right ventricular size is normal. No increase in right ventricular wall thickness. Right ventricular systolic function  is normal. There is normal pulmonary artery systolic pressure. The tricuspid regurgitant velocity is 2.19 m/s, and  with an assumed right atrial pressure of 3 mmHg, the estimated right ventricular systolic pressure is 22.2 mmHg. Left Atrium: Left atrial size was normal in size. Right Atrium: Right atrial size was normal in size. Pericardium: There is no evidence of pericardial effusion. Mitral Valve: The mitral valve is normal in structure. No evidence of mitral valve regurgitation. No evidence of mitral valve stenosis. Tricuspid Valve: The tricuspid valve is normal in structure. Tricuspid valve regurgitation is trivial. No evidence of tricuspid stenosis. Aortic Valve: The aortic valve is tricuspid. Aortic valve regurgitation is not visualized. No aortic stenosis is present. Pulmonic Valve: The pulmonic valve was normal in structure. Pulmonic valve regurgitation is trivial. No evidence of pulmonic stenosis. Aorta: The aortic root is normal in size and structure. Venous: The inferior vena cava is normal in size with greater than 50% respiratory variability, suggesting right atrial pressure of 3 mmHg.  IAS/Shunts: No atrial level shunt detected by color flow Doppler.  LEFT VENTRICLE PLAX 2D LVIDd:         4.90 cm   Diastology LVIDs:         2.20 cm   LV e' medial:    6.42 cm/s LV PW:         1.30 cm   LV E/e' medial:  7.9 LV IVS:        1.30 cm   LV e' lateral:   7.40 cm/s LVOT diam:     2.10 cm   LV E/e' lateral: 6.9 LV SV:         60 LV SV Index:   29 LVOT Area:     3.46 cm  RIGHT VENTRICLE RV S prime:     13.50 cm/s TAPSE (M-mode): 2.6 cm LEFT ATRIUM           Index        RIGHT ATRIUM           Index LA diam:      3.60 cm 1.74 cm/m   RA Area:     21.10 cm LA Vol (A2C): 44.7 ml 21.61 ml/m  RA Volume:   59.90 ml  28.96 ml/m LA Vol (A4C): 42.6 ml 20.60 ml/m  AORTIC VALVE LVOT Vmax:   89.80 cm/s LVOT Vmean:  58.500 cm/s LVOT VTI:    0.174 m  AORTA Ao Root diam: 3.70 cm MITRAL VALVE               TRICUSPID VALVE MV Area (PHT): 2.91 cm    TR Peak grad:   19.2 mmHg MV Decel Time: 261 msec    TR Vmax:        219.00 cm/s MV E velocity: 50.70 cm/s MV A velocity: 79.50 cm/s  SHUNTS MV E/A ratio:  0.64        Systemic VTI:  0.17 m                            Systemic Diam: 2.10 cm Vishnu Priya Mallipeddi Electronically signed by Winfield Rast Mallipeddi Signature Date/Time: 05/22/2023/5:22:23 PM    Final    MR BRAIN WO CONTRAST Result Date: 05/22/2023 CLINICAL DATA:  Neuro deficit, acute, stroke suspected. EXAM: MRI HEAD WITHOUT CONTRAST TECHNIQUE: Multiplanar, multiecho pulse sequences of the brain and surrounding structures were obtained without intravenous contrast. COMPARISON:  CT head from today. FINDINGS: Brain: No acute  infarction, hemorrhage, hydrocephalus, extra-axial collection or mass lesion. Mild T2/FLAIR hyperintensities the white matter nonspecific but compatible with chronic microvascular disease. Vascular: Major arterial flow voids are maintained at the skull base. Skull and upper cervical spine: Normal marrow signal. Sinuses/Orbits: Moderate, predominately ethmoid air cell mucosal thickening. No  acute orbital findings. Other: No mastoid effusions. IMPRESSION: No evidence of acute intracranial abnormality. Electronically Signed   By: Feliberto Harts M.D.   On: 05/22/2023 00:42   CT HEAD CODE STROKE WO CONTRAST Result Date: 05/21/2023 CLINICAL DATA:  Code stroke.  Neuro deficit, acute, stroke suspected EXAM: CT HEAD WITHOUT CONTRAST TECHNIQUE: Contiguous axial images were obtained from the base of the skull through the vertex without intravenous contrast. RADIATION DOSE REDUCTION: This exam was performed according to the departmental dose-optimization program which includes automated exposure control, adjustment of the mA and/or kV according to patient size and/or use of iterative reconstruction technique. COMPARISON:  CT head and MRI head July 27, 2022. FINDINGS: Brain: No evidence of acute large vascular territory infarction, hemorrhage, hydrocephalus, extra-axial collection or mass lesion/mass effect. Vascular: No hyperdense vessel identified. Skull: No acute fracture. Sinuses/Orbits: Opacification of multiple ethmoid air cells. No acute orbital findings. Other: No mastoid effusions. ASPECTS [facility] Stroke Program Early CT Score) total score (0-10 with 10 being normal): 10. IMPRESSION: 1. No evidence of acute intracranial abnormality. 2. ASPECTS is 10. Code stroke imaging results were communicated on 05/21/2023 at 9:36 pm to provider Dr. Durwin Nora via telephone, who verbally acknowledged these results. Electronically Signed   By: Feliberto Harts M.D.   On: 05/21/2023 21:37   DG Forearm Right Result Date: 05/03/2023 CLINICAL DATA:  Fall and trauma to the right upper extremity. EXAM: RIGHT FOREARM - 2 VIEW COMPARISON:  Right hand radiograph dated 05/03/2023. FINDINGS: There is no evidence of fracture or other focal bone lesions. Soft tissues are unremarkable. IMPRESSION: Negative. Electronically Signed   By: Elgie Collard M.D.   On: 05/03/2023 15:21   DG Hand Complete Right Result Date:  05/03/2023 CLINICAL DATA:  Injury.  Puncture wound. EXAM: RIGHT HAND - COMPLETE 3+ VIEW COMPARISON:  None Available. FINDINGS: No acute fracture or dislocation. No aggressive osseous lesion. No significant arthritis of imaged joints. No radiopaque foreign bodies. Soft tissues are within normal limits. No focal soft tissue defect or air within the soft tissue. IMPRESSION: *No acute osseous abnormality of the right hand. Electronically Signed   By: Jules Schick M.D.   On: 05/03/2023 15:12    Microbiology: Results for orders placed or performed during the hospital encounter of 05/11/22  Resp panel by RT-PCR (RSV, Flu A&B, Covid) Anterior Nasal Swab     Status: Abnormal   Collection Time: 05/11/22  4:02 PM   Specimen: Anterior Nasal Swab  Result Value Ref Range Status   SARS Coronavirus 2 by RT PCR POSITIVE (A) NEGATIVE Final    Comment: (NOTE) SARS-CoV-2 target nucleic acids are DETECTED.  The SARS-CoV-2 RNA is generally detectable in upper respiratory specimens during the acute phase of infection. Positive results are indicative of the presence of the identified virus, but do not rule out bacterial infection or co-infection with other pathogens not detected by the test. Clinical correlation with patient history and other diagnostic information is necessary to determine patient infection status. The expected result is Negative.  Fact Sheet for Patients: BloggerCourse.com  Fact Sheet for Healthcare Providers: SeriousBroker.it  This test is not yet approved or cleared by the [place] and  has been authorized for  detection and/or diagnosis of SARS-CoV-2 by FDA under an Emergency Use Authorization (EUA).  This EUA will remain in effect (meaning this test can be used) for the duration of  the COVID-19 declaration under Section 564(b)(1) of the [place] ct, 21 U.S.C. section 360bbb-3(b)(1), unless the authorization is terminated or revoked  sooner.     Influenza A by PCR NEGATIVE NEGATIVE Final   Influenza B by PCR NEGATIVE NEGATIVE Final    Comment: (NOTE) The Xpert Xpress SARS-CoV-2/FLU/RSV plus assay is intended as an aid in the diagnosis of influenza from Nasopharyngeal swab specimens and should not be used as a sole basis for treatment. Nasal washings and aspirates are unacceptable for Xpert Xpress SARS-CoV-2/FLU/RSV testing.  Fact Sheet for Patients: BloggerCourse.com  Fact Sheet for Healthcare Providers: SeriousBroker.it  This test is not yet approved or cleared by the [place] FDA and has been authorized for detection and/or diagnosis of SARS-CoV-2 by FDA under an Emergency Use Authorization (EUA). This EUA will remain in effect (meaning this test can be used) for the duration of the COVID-19 declaration under Section 564(b)(1) of the Act, 21 U.S.C. section 360bbb-3(b)(1), unless the authorization is terminated or revoked.     Resp Syncytial Virus by PCR NEGATIVE NEGATIVE Final    Comment: (NOTE) Fact Sheet for Patients: BloggerCourse.com  Fact Sheet for Healthcare Providers: SeriousBroker.it  This test is not yet approved or cleared by the [place] FDA and has been authorized for detection and/or diagnosis of SARS-CoV-2 by FDA under an Emergency Use Authorization (EUA). This EUA will remain in effect (meaning this test can be used) for the duration of the COVID-19 declaration under Section 564(b)(1) of the Act, 21 U.S.C. section 360bbb-3(b)(1), unless the authorization is terminated or revoked.  Performed at [facility], [address] [place], [place] [address]   Group A Strep by PCR     Status: None   Collection Time: 05/11/22  4:02 PM   Specimen: Anterior Nasal Swab; Sterile Swab  Result Value Ref Range Status   Group A Strep by PCR NOT DETECTED NOT DETECTED Final     Comment: Performed at [facility], [address] [place], [place] [address]    Labs: CBC: Recent Labs  Lab 05/21/23 2133 05/22/23 0506  WBC 9.0 8.8  NEUTROABS 3.8  --   HGB 14.2 13.1  HCT 41.4 38.7*  MCV 88.8 88.2  PLT 297 275   Basic Metabolic Panel: Recent Labs  Lab 05/21/23 2133 05/22/23 0506  NA 140 140  K 3.9 3.5  CL 107 108  CO2 25 27  GLUCOSE 141* 111*  BUN 15 18  CREATININE 1.30* 1.25*  CALCIUM 9.0 8.8*  MG  --  2.2  PHOS  --  3.1   Liver Function Tests: Recent Labs  Lab 05/21/23 2133 05/22/23 0506  AST 23 17  ALT 20 18  ALKPHOS 84 82  BILITOT 0.9 0.4  PROT 7.2 6.7  ALBUMIN 3.8 3.7   CBG: Recent Labs  Lab 05/21/23 2116  GLUCAP 127*    Discharge time spent: greater than 30 minutes.  Signed: Marolyn Haller, MD [facility] 05/24/2023

## 2023-06-20 ENCOUNTER — Other Ambulatory Visit: Payer: Self-pay | Admitting: Internal Medicine

## 2023-06-26 DIAGNOSIS — E785 Hyperlipidemia, unspecified: Secondary | ICD-10-CM | POA: Diagnosis not present

## 2023-06-26 DIAGNOSIS — R7303 Prediabetes: Secondary | ICD-10-CM | POA: Diagnosis not present

## 2023-06-27 ENCOUNTER — Encounter: Payer: Self-pay | Admitting: Internal Medicine

## 2023-07-02 DIAGNOSIS — E785 Hyperlipidemia, unspecified: Secondary | ICD-10-CM | POA: Diagnosis not present

## 2023-07-02 DIAGNOSIS — M546 Pain in thoracic spine: Secondary | ICD-10-CM | POA: Diagnosis not present

## 2023-07-02 DIAGNOSIS — K4090 Unilateral inguinal hernia, without obstruction or gangrene, not specified as recurrent: Secondary | ICD-10-CM | POA: Diagnosis not present

## 2023-07-02 DIAGNOSIS — K5120 Ulcerative (chronic) proctitis without complications: Secondary | ICD-10-CM | POA: Diagnosis not present

## 2023-07-02 DIAGNOSIS — L299 Pruritus, unspecified: Secondary | ICD-10-CM | POA: Diagnosis not present

## 2023-07-02 DIAGNOSIS — Z8673 Personal history of transient ischemic attack (TIA), and cerebral infarction without residual deficits: Secondary | ICD-10-CM | POA: Diagnosis not present

## 2023-07-02 DIAGNOSIS — E870 Hyperosmolality and hypernatremia: Secondary | ICD-10-CM | POA: Diagnosis not present

## 2023-07-02 DIAGNOSIS — I10 Essential (primary) hypertension: Secondary | ICD-10-CM | POA: Diagnosis not present

## 2023-07-02 DIAGNOSIS — K219 Gastro-esophageal reflux disease without esophagitis: Secondary | ICD-10-CM | POA: Diagnosis not present

## 2023-07-02 DIAGNOSIS — R7303 Prediabetes: Secondary | ICD-10-CM | POA: Diagnosis not present

## 2023-07-02 DIAGNOSIS — W19XXXD Unspecified fall, subsequent encounter: Secondary | ICD-10-CM | POA: Diagnosis not present

## 2023-08-06 DIAGNOSIS — G8929 Other chronic pain: Secondary | ICD-10-CM | POA: Diagnosis not present

## 2023-08-06 DIAGNOSIS — I10 Essential (primary) hypertension: Secondary | ICD-10-CM | POA: Diagnosis not present

## 2023-08-06 DIAGNOSIS — M545 Low back pain, unspecified: Secondary | ICD-10-CM | POA: Diagnosis not present

## 2023-09-19 ENCOUNTER — Ambulatory Visit: Admitting: Allergy & Immunology

## 2023-09-26 ENCOUNTER — Other Ambulatory Visit: Payer: Self-pay

## 2023-09-26 ENCOUNTER — Emergency Department (HOSPITAL_COMMUNITY)

## 2023-09-26 ENCOUNTER — Emergency Department (HOSPITAL_COMMUNITY)
Admission: EM | Admit: 2023-09-26 | Discharge: 2023-09-27 | Disposition: A | Discharge status: Home / Self Care | Attending: Emergency Medicine | Admitting: Emergency Medicine

## 2023-09-26 ENCOUNTER — Encounter (HOSPITAL_COMMUNITY): Payer: Self-pay

## 2023-09-26 DIAGNOSIS — Z7982 Long term (current) use of aspirin: Secondary | ICD-10-CM | POA: Insufficient documentation

## 2023-09-26 DIAGNOSIS — Z79899 Other long term (current) drug therapy: Secondary | ICD-10-CM | POA: Insufficient documentation

## 2023-09-26 DIAGNOSIS — I10 Essential (primary) hypertension: Secondary | ICD-10-CM | POA: Insufficient documentation

## 2023-09-26 DIAGNOSIS — R519 Headache, unspecified: Secondary | ICD-10-CM

## 2023-09-26 MED ORDER — ONDANSETRON 8 MG PO TBDP
8.0000 mg | ORAL_TABLET | Freq: Once | ORAL | Status: AC
  Administered 2023-09-26: 8 mg via ORAL
  Filled 2023-09-26: qty 1

## 2023-09-26 MED ORDER — ACETAMINOPHEN 325 MG PO TABS
650.0000 mg | ORAL_TABLET | Freq: Once | ORAL | Status: AC
  Administered 2023-09-26: 650 mg via ORAL
  Filled 2023-09-26: qty 2

## 2023-09-26 MED ORDER — BUTALBITAL-APAP-CAFFEINE 50-325-40 MG PO TABS
1.0000 | ORAL_TABLET | Freq: Once | ORAL | Status: AC
  Administered 2023-09-26: 1 via ORAL
  Filled 2023-09-26: qty 1

## 2023-09-26 NOTE — ED Triage Notes (Addendum)
 Pt from home complains of hypertension. States he checked his BP at home and got 160/150, Endorses Blurred vision, headache,vomiting, and dizziness. Denies CP/SOB. Takes amlodipine  for BP.  Pt aaox4 and ambulatory, symptoms started yesterday.

## 2023-09-27 MED ORDER — BUTALBITAL-APAP-CAFFEINE 50-325-40 MG PO TABS: 1.0000 | ORAL_TABLET | Freq: Four times a day (QID) | ORAL | 0 refills | Status: AC | PRN

## 2023-09-27 NOTE — ED Notes (Signed)
Patient verbalizes understanding of discharge instructions. Opportunity for questioning and answers were provided. Armband removed by staff, pt discharged from ED. Ambulated out to lobby with wife  

## 2023-09-27 NOTE — ED Provider Notes (Signed)
 [place] EMERGENCY DEPARTMENT AT [facility] Provider Note   CSN: [id number] Arrival date & time: 09/26/23  2153     Patient presents with: Hypertension   Shannon Wilson is a 53 y.o. male.   H/o htn on amlodipine , ARB for same. Has chronic pain and on meds for that. Tonight was having a headache and checked his BP and it was elevated beyond his baseline and thus came here for eval. Endorses mild blurry viision earlier which has since improved. No chest pain, new or worsening back pain, edema, sob, urinary changes or other associated symptoms.   Hypertension       Prior to Admission medications   Medication Sig Start Date End Date Taking? Authorizing Provider  butalbital-acetaminophen -caffeine (FIORICET) 50-325-40 MG tablet Take 1-2 tablets by mouth every 6 (six) hours as needed for headache. 09/27/23 09/26/24 Yes Beatrice Ziehm, Reymundo Caulk, MD  amLODipine  (NORVASC ) 10 MG tablet Take 1 tablet (10 mg total) by mouth daily. 11/03/18   Tonya Fredrickson, MD  aspirin  EC 81 MG tablet Take 1 tablet (81 mg total) by mouth daily. 05/22/23   Joette Mustard, MD  atorvastatin  (LIPITOR) 80 MG tablet Take 1 tablet (80 mg total) by mouth daily. 05/22/23 05/21/24  Joette Mustard, MD  baclofen (LIORESAL) 10 MG tablet Take 10 mg by mouth 3 (three) times daily as needed for muscle spasms.    [provider]  gabapentin  (NEURONTIN ) 300 MG capsule Take 300 mg by mouth 3 (three) times daily as needed (nerve pain).    [provider]  lisinopril  (ZESTRIL ) 40 MG tablet Take 40 mg by mouth daily.    [provider]  meloxicam (MOBIC) 15 MG tablet TAKE 1 TABLET BY MOUTH ONCE DAILY *REFILL REQUEST*    [provider]  mesalamine  (LIALDA ) 1.2 g EC tablet TAKE TWO (2) TABLETS BY MOUTH ONCE DAILY 06/20/23   Eustacio Highman, NP    Allergies: Hydrocodone -acetaminophen  and Triamcinolone    Review of Systems  Updated Vital Signs BP (!) 151/100   Pulse 60   Temp 98 F (36.7  C) (Oral)   Resp 16   Ht 5' 11 (1.803 m)   Wt 89.4 kg   SpO2 96%   BMI 27.48 kg/m   Physical Exam Vitals and nursing note reviewed.  Constitutional:      Appearance: He is well-developed.  HENT:     Head: Normocephalic and atraumatic.   Cardiovascular:     Rate and Rhythm: Normal rate.  Pulmonary:     Effort: Pulmonary effort is normal. No respiratory distress.  Abdominal:     General: There is no distension.   Musculoskeletal:        General: Normal range of motion.     Cervical back: Normal range of motion.   Skin:    General: Skin is warm and dry.   Neurological:     General: No focal deficit present.     Mental Status: He is alert. Mental status is at baseline.     (all labs ordered are listed, but only abnormal results are displayed) Labs Reviewed - No data to display  EKG: None  Radiology: CT Head Wo Contrast Result Date: 09/26/2023 CLINICAL DATA:  Neuro deficit, acute, stroke suspected EXAM: CT HEAD WITHOUT CONTRAST TECHNIQUE: Contiguous axial images were obtained from the base of the skull through the vertex without intravenous contrast. RADIATION DOSE REDUCTION: This exam was performed according to the departmental dose-optimization program which includes automated exposure control, adjustment of  the mA and/or kV according to patient size and/or use of iterative reconstruction technique. COMPARISON:  CT head April 18, 24. FINDINGS: Brain: No evidence of acute infarction, hemorrhage, hydrocephalus, extra-axial collection or mass lesion/mass effect. Vascular: No hyperdense vessel. Skull: No acute fracture. Sinuses/Orbits: Paranasal sinus mucosal thickening, greatest in the ethmoid sinuses. No acute orbital findings. Other: No mastoid effusions. IMPRESSION: No evidence of acute intracranial abnormality. Electronically Signed   By: Stevenson Elbe M.D.   On: 09/26/2023 22:56     Procedures   Medications Ordered in the ED  butalbital-acetaminophen -caffeine  (FIORICET) 50-325-40 MG per tablet 1 tablet (1 tablet Oral Given 09/26/23 2352)  ondansetron  (ZOFRAN -ODT) disintegrating tablet 8 mg (8 mg Oral Given 09/26/23 2352)  acetaminophen  (TYLENOL ) tablet 650 mg (650 mg Oral Given 09/26/23 2352)                                    Medical Decision Making Risk OTC drugs. Prescription drug management.   Difficult to tell if HA caused htn bump or vice versa but symptoms improved, CT reassuring, no e/o other end organ damage related to BP. Will log BP's follow up with PCP for consideration of med changes and reeval headache symptoms.      Final diagnoses:  Hypertension, unspecified type  Nonintractable headache, unspecified chronicity pattern, unspecified headache type    ED Discharge Orders          Ordered    butalbital-acetaminophen -caffeine (FIORICET) 50-325-40 MG tablet  Every 6 hours PRN        09/27/23 0107               Pate Aylward, Reymundo Caulk, MD 09/27/23 0559

## 2023-10-11 ENCOUNTER — Ambulatory Visit (HOSPITAL_COMMUNITY): Admitting: Psychiatry

## 2024-01-09 ENCOUNTER — Ambulatory Visit (HOSPITAL_COMMUNITY): Admitting: Psychiatry

## 2024-01-11 ENCOUNTER — Other Ambulatory Visit: Payer: Self-pay

## 2024-01-11 ENCOUNTER — Emergency Department (HOSPITAL_BASED_OUTPATIENT_CLINIC_OR_DEPARTMENT_OTHER): Admitting: Radiology

## 2024-01-11 ENCOUNTER — Emergency Department (HOSPITAL_BASED_OUTPATIENT_CLINIC_OR_DEPARTMENT_OTHER)
Admission: EM | Admit: 2024-01-11 | Discharge: 2024-01-11 | Disposition: A | Discharge status: Home / Self Care | Attending: Emergency Medicine | Admitting: Emergency Medicine

## 2024-01-11 DIAGNOSIS — M79604 Pain in right leg: Secondary | ICD-10-CM

## 2024-01-11 DIAGNOSIS — W228XXA Striking against or struck by other objects, initial encounter: Secondary | ICD-10-CM | POA: Insufficient documentation

## 2024-01-11 DIAGNOSIS — M79661 Pain in right lower leg: Secondary | ICD-10-CM | POA: Insufficient documentation

## 2024-01-11 DIAGNOSIS — M7989 Other specified soft tissue disorders: Secondary | ICD-10-CM | POA: Insufficient documentation

## 2024-01-11 DIAGNOSIS — I10 Essential (primary) hypertension: Secondary | ICD-10-CM | POA: Diagnosis not present

## 2024-01-11 DIAGNOSIS — Z79899 Other long term (current) drug therapy: Secondary | ICD-10-CM | POA: Diagnosis not present

## 2024-01-11 DIAGNOSIS — Z8673 Personal history of transient ischemic attack (TIA), and cerebral infarction without residual deficits: Secondary | ICD-10-CM | POA: Diagnosis not present

## 2024-01-11 DIAGNOSIS — R202 Paresthesia of skin: Secondary | ICD-10-CM | POA: Diagnosis not present

## 2024-01-11 DIAGNOSIS — Z7982 Long term (current) use of aspirin: Secondary | ICD-10-CM | POA: Diagnosis not present

## 2024-01-11 MED ORDER — ACETAMINOPHEN 500 MG PO TABS
1000.0000 mg | ORAL_TABLET | Freq: Once | ORAL | Status: AC
  Administered 2024-01-11: 1000 mg via ORAL
  Filled 2024-01-11: qty 2

## 2024-01-11 NOTE — Discharge Instructions (Signed)
 You were evaluated in the emergency room for right leg pain. Your xray did not show any fracture or acute abnormality. I expect you developed a hematoma which is a collection of blood. This should resolve. You may use tylenol  and ice. If your symptoms persist please call the number on the sheet to follow up with orthopedics.
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
## 2024-01-11 NOTE — ED Provider Notes (Signed)
 [place] EMERGENCY DEPARTMENT AT [facility] Provider Note   CSN: [id number] Arrival date & time: 01/11/24  1609     Patient presents with: Leg Injury   Shannon Wilson is a 53 y.o. male who presents after hitting his R leg on a trailer hitch. Had a brief period of numbness and tingling which has since resolved. Pain is worse with ambulation.    HPI    Past Medical History:  Diagnosis Date   Back pain    GERD (gastroesophageal reflux disease)    Hyperlipidemia    Hypertension    Kidney stone    Lumbosacral radiculopathy at S1 09/04/2017   Migraine    Myocardial infarction [facility])    Stroke (HCC)    TIA (transient ischemic attack)    Vasculitis    celiac artery vasculitis     Prior to Admission medications   Medication Sig Start Date End Date Taking? Authorizing Provider  amLODipine  (NORVASC ) 10 MG tablet Take 1 tablet (10 mg total) by mouth daily. 11/03/18   Towana Ozell BROCKS, MD  aspirin  EC 81 MG tablet Take 1 tablet (81 mg total) by mouth daily. 05/22/23   Franchot Novel, MD  atorvastatin  (LIPITOR) 80 MG tablet Take 1 tablet (80 mg total) by mouth daily. 05/22/23 05/21/24  Franchot Novel, MD  baclofen (LIORESAL) 10 MG tablet Take 10 mg by mouth 3 (three) times daily as needed for muscle spasms.    [provider]  butalbital -acetaminophen -caffeine  (FIORICET ) 50-325-40 MG tablet Take 1-2 tablets by mouth every 6 (six) hours as needed for headache. 09/27/23 09/26/24  Mesner, Selinda, MD  gabapentin  (NEURONTIN ) 300 MG capsule Take 300 mg by mouth 3 (three) times daily as needed (nerve pain).    [provider]  lisinopril  (ZESTRIL ) 40 MG tablet Take 40 mg by mouth daily.    [provider]  meloxicam (MOBIC) 15 MG tablet TAKE 1 TABLET BY MOUTH ONCE DAILY *REFILL REQUEST*    [provider]  mesalamine  (LIALDA ) 1.2 g EC tablet TAKE TWO (2) TABLETS BY MOUTH ONCE DAILY 06/20/23   Kennedy Charmaine CROME, NP    Allergies:  Hydrocodone -acetaminophen  and Triamcinolone    Review of Systems  Musculoskeletal:  Positive for myalgias.    Updated Vital Signs BP (!) 150/75   Pulse 78   Temp 98.2 F (36.8 C) (Oral)   Resp 16   SpO2 100%   Physical Exam Vitals and nursing note reviewed.  Constitutional:      General: He is not in acute distress.    Appearance: He is well-developed.  HENT:     Head: Normocephalic and atraumatic.  Eyes:     Conjunctiva/sclera: Conjunctivae normal.  Cardiovascular:     Pulses: Normal pulses.     Heart sounds: No murmur heard. Pulmonary:     Effort: Pulmonary effort is normal. No respiratory distress.  Musculoskeletal:     Cervical back: Neck supple.     Comments: Tenderness to R anterior shin. Mild swelling. No ecchymosis. No swelling about knee. 5/5 lower extremity strength. Sensation intact. Tolerates ambulation.    Skin:    General: Skin is warm and dry.     Capillary Refill: Capillary refill takes less than 2 seconds.  Neurological:     Mental Status: He is alert.  Psychiatric:        Mood and Affect: Mood normal.     (all labs ordered are listed, but only abnormal results are displayed) Labs Reviewed - No data to display  EKG: None  Radiology: DG Tibia/Fibula Right Result Date: 01/11/2024 CLINICAL DATA:  Right lower leg injury. EXAM: RIGHT TIBIA AND FIBULA - 2 VIEW COMPARISON:  None Available. FINDINGS: There is no evidence of fracture or other focal bone lesions. Soft tissues are unremarkable. IMPRESSION: Negative. Electronically Signed   By: Lynwood Landy Raddle M.D.   On: 01/11/2024 17:14     Procedures   Medications Ordered in the ED  acetaminophen  (TYLENOL ) tablet 1,000 mg (1,000 mg Oral Given 01/11/24 1842)    Clinical Course as of 01/11/24 1848  Fri Jan 11, 2024  1846 Patient evaluated for R leg injury. He is hemodynamically stable. On exam he has tenderness and mild swelling to the R lateral shin. No evidence of fracture on xray.  He is ambulatory.   Will provide ortho follow up. Tylenol  and ice. Patient is understanding and in agreement with plan.  Etiology likely contusion.  Does not appear to have significant fluid collection [JT]    Clinical Course User Index [JT] Donnajean Lynwood DEL, PA-C                                 Medical Decision Making  This patient presents to the ED with chief complaint(s) of R leg pain .  The complaint involves an extensive differential diagnosis and also carries with it a high risk of complications and morbidity.   Pertinent past medical history as listed in HPI  The differential diagnosis includes  Fracture, dislocation, sprain  Additional history obtained: Records reviewed Care Everywhere/External Records  Disposition:   Patient will be discharged home. The patient has been appropriately medically screened and/or stabilized in the ED. I have low suspicion for any other emergent medical condition which would require further screening, evaluation or treatment in the ED or require inpatient management. At time of discharge the patient is hemodynamically stable and in no acute distress. I have discussed work-up results and diagnosis with patient and answered all questions. Patient is agreeable with discharge plan. We discussed strict return precautions for returning to the emergency department and they verbalized understanding.     Social Determinants of Health:   none  This note was dictated with voice recognition software.  Despite best efforts at proofreading, errors may have occurred which can change the documentation meaning.       Final diagnoses:  Right leg pain    ED Discharge Orders     None          Donnajean Lynwood DEL, PA-C 01/11/24 1848    Pamella Ozell LABOR, DO 01/17/24 1730

## 2024-03-04 ENCOUNTER — Other Ambulatory Visit: Payer: Self-pay

## 2024-03-04 ENCOUNTER — Emergency Department (HOSPITAL_COMMUNITY)

## 2024-03-04 ENCOUNTER — Emergency Department (HOSPITAL_COMMUNITY)
Admission: EM | Admit: 2024-03-04 | Discharge: 2024-03-04 | Disposition: A | Discharge status: Home / Self Care | Attending: Emergency Medicine | Admitting: Emergency Medicine

## 2024-03-04 ENCOUNTER — Encounter (HOSPITAL_COMMUNITY): Payer: Self-pay | Admitting: Emergency Medicine

## 2024-03-04 DIAGNOSIS — R110 Nausea: Secondary | ICD-10-CM | POA: Diagnosis not present

## 2024-03-04 DIAGNOSIS — Z79899 Other long term (current) drug therapy: Secondary | ICD-10-CM | POA: Insufficient documentation

## 2024-03-04 DIAGNOSIS — R0789 Other chest pain: Secondary | ICD-10-CM | POA: Insufficient documentation

## 2024-03-04 DIAGNOSIS — I10 Essential (primary) hypertension: Secondary | ICD-10-CM | POA: Insufficient documentation

## 2024-03-04 DIAGNOSIS — Z8673 Personal history of transient ischemic attack (TIA), and cerebral infarction without residual deficits: Secondary | ICD-10-CM | POA: Insufficient documentation

## 2024-03-04 DIAGNOSIS — Z7982 Long term (current) use of aspirin: Secondary | ICD-10-CM | POA: Insufficient documentation

## 2024-03-04 DIAGNOSIS — R079 Chest pain, unspecified: Secondary | ICD-10-CM

## 2024-03-04 LAB — BASIC METABOLIC PANEL WITH GFR
Anion gap: 9 (ref 5–15)
BUN: 17 mg/dL (ref 6–20)
CO2: 25 mmol/L (ref 22–32)
Calcium: 9.2 mg/dL (ref 8.9–10.3)
Chloride: 108 mmol/L (ref 98–111)
Creatinine, Ser: 0.95 mg/dL (ref 0.61–1.24)
GFR, Estimated: >60 mL/min (ref >60)
Glucose, Bld: 118 mg/dL — ABNORMAL HIGH (ref 70–99)
Potassium: 3.8 mmol/L (ref 3.5–5.1)
Sodium: 142 mmol/L (ref 135–145)

## 2024-03-04 LAB — CBC
HCT: 39.5 % (ref 39.0–52.0)
Hemoglobin: 13.5 g/dL (ref 13.0–17.0)
MCH: 30.1 pg (ref 26.0–34.0)
MCHC: 34.2 g/dL (ref 30.0–36.0)
MCV: 88 fL (ref 80.0–100.0)
Platelets: 323 K/uL (ref 150–400)
RBC: 4.49 MIL/uL (ref 4.22–5.81)
RDW: 12.4 % (ref 11.5–15.5)
WBC: 8 K/uL (ref 4.0–10.5)
nRBC: 0 % (ref 0.0–0.2)

## 2024-03-04 LAB — TROPONIN T, HIGH SENSITIVITY
Troponin T High Sensitivity: <15 ng/L (ref 0–19)
Troponin T High Sensitivity: <15 ng/L (ref 0–19)

## 2024-03-04 MED ORDER — LIDOCAINE 5 % EX PTCH
1.0000 | MEDICATED_PATCH | CUTANEOUS | Status: DC
  Administered 2024-03-04: 1 via TRANSDERMAL
  Filled 2024-03-04: qty 1

## 2024-03-04 MED ORDER — FENTANYL CITRATE (PF) 100 MCG/2ML IJ SOLN
50.0000 ug | Freq: Once | INTRAMUSCULAR | Status: AC
  Administered 2024-03-04: 50 ug via INTRAVENOUS
  Filled 2024-03-04: qty 2
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
## 2024-03-04 NOTE — ED Triage Notes (Signed)
 Pt c/o of right sided sharp chest pain that radiates down his right arm. Also c/o of nausea. States the pain woke him up around 2200 last night. Hx of stroke and MI per pt.

## 2024-04-15 ENCOUNTER — Other Ambulatory Visit: Payer: Self-pay

## 2024-04-15 ENCOUNTER — Emergency Department (HOSPITAL_COMMUNITY)
Admission: EM | Admit: 2024-04-15 | Discharge: 2024-04-15 | Discharge status: Against Medical Advice | Attending: Emergency Medicine | Admitting: Emergency Medicine

## 2024-04-15 ENCOUNTER — Encounter (HOSPITAL_COMMUNITY): Payer: Self-pay | Admitting: *Deleted

## 2024-04-15 DIAGNOSIS — R519 Headache, unspecified: Secondary | ICD-10-CM | POA: Diagnosis not present

## 2024-04-15 DIAGNOSIS — Z5321 Procedure and treatment not carried out due to patient leaving prior to being seen by health care provider: Secondary | ICD-10-CM | POA: Diagnosis not present

## 2024-04-15 DIAGNOSIS — R21 Rash and other nonspecific skin eruption: Secondary | ICD-10-CM | POA: Insufficient documentation

## 2024-04-15 NOTE — ED Triage Notes (Signed)
 Pt c/o tenderness to scalp, noted a rash to right chest that is painful. Stiffness to right neck and noted a knot x 3 days.

## 2024-04-16 ENCOUNTER — Other Ambulatory Visit: Payer: Self-pay | Admitting: Gastroenterology
